# Patient Record
Sex: Female | Born: 1940 | Race: White | Hispanic: No | Marital: Married | Smoking: Former smoker
Health system: Southern US, Community
[De-identification: ages and names within clinical notes are randomized; demographics above are authoritative.]

## PROBLEM LIST (undated history)

## (undated) DIAGNOSIS — E7800 Pure hypercholesterolemia, unspecified: Secondary | ICD-10-CM

## (undated) DIAGNOSIS — R413 Other amnesia: Secondary | ICD-10-CM

## (undated) DIAGNOSIS — R51 Headache: Secondary | ICD-10-CM

## (undated) DIAGNOSIS — C50919 Malignant neoplasm of unspecified site of unspecified female breast: Secondary | ICD-10-CM

## (undated) DIAGNOSIS — T8859XA Other complications of anesthesia, initial encounter: Secondary | ICD-10-CM

## (undated) DIAGNOSIS — R911 Solitary pulmonary nodule: Secondary | ICD-10-CM

## (undated) DIAGNOSIS — R197 Diarrhea, unspecified: Secondary | ICD-10-CM

## (undated) DIAGNOSIS — K219 Gastro-esophageal reflux disease without esophagitis: Secondary | ICD-10-CM

## (undated) DIAGNOSIS — C3490 Malignant neoplasm of unspecified part of unspecified bronchus or lung: Secondary | ICD-10-CM

## (undated) DIAGNOSIS — I10 Essential (primary) hypertension: Secondary | ICD-10-CM

## (undated) DIAGNOSIS — T4145XA Adverse effect of unspecified anesthetic, initial encounter: Secondary | ICD-10-CM

## (undated) DIAGNOSIS — D649 Anemia, unspecified: Secondary | ICD-10-CM

## (undated) DIAGNOSIS — C50911 Malignant neoplasm of unspecified site of right female breast: Secondary | ICD-10-CM

## (undated) DIAGNOSIS — R519 Headache, unspecified: Secondary | ICD-10-CM

## (undated) HISTORY — PX: ABDOMINAL HYSTERECTOMY: SHX81

## (undated) HISTORY — PX: TONSILLECTOMY: SUR1361

## (undated) HISTORY — PX: BREAST SURGERY: SHX581

## (undated) HISTORY — DX: Other amnesia: R41.3

## (undated) HISTORY — DX: Gastro-esophageal reflux disease without esophagitis: K21.9

## (undated) HISTORY — DX: Malignant neoplasm of unspecified site of right female breast: C50.911

## (undated) HISTORY — DX: Solitary pulmonary nodule: R91.1

## (undated) HISTORY — DX: Malignant neoplasm of unspecified part of unspecified bronchus or lung: C34.90

## (undated) HISTORY — DX: Pure hypercholesterolemia, unspecified: E78.00

## (undated) HISTORY — DX: Malignant neoplasm of unspecified site of unspecified female breast: C50.919

## (undated) HISTORY — PX: CARPAL TUNNEL RELEASE: SHX101

## (undated) HISTORY — PX: CATARACT EXTRACTION: SUR2

## (undated) HISTORY — DX: Essential (primary) hypertension: I10

---

## 1999-12-12 ENCOUNTER — Encounter: Admission: RE | Admit: 1999-12-12 | Discharge: 2000-03-11 | Payer: Self-pay | Admitting: Radiation Oncology

## 2001-05-10 ENCOUNTER — Encounter (HOSPITAL_COMMUNITY): Admission: RE | Admit: 2001-05-10 | Discharge: 2001-06-09 | Payer: Self-pay | Admitting: Oncology

## 2001-05-10 ENCOUNTER — Encounter: Admission: RE | Admit: 2001-05-10 | Discharge: 2001-05-10 | Payer: Self-pay | Admitting: Oncology

## 2001-08-13 ENCOUNTER — Encounter: Admission: RE | Admit: 2001-08-13 | Discharge: 2001-08-13 | Payer: Self-pay | Admitting: Oncology

## 2001-08-21 ENCOUNTER — Other Ambulatory Visit: Admission: RE | Admit: 2001-08-21 | Discharge: 2001-08-21 | Payer: Self-pay | Admitting: Obstetrics and Gynecology

## 2001-08-21 ENCOUNTER — Ambulatory Visit (HOSPITAL_COMMUNITY): Admission: RE | Admit: 2001-08-21 | Discharge: 2001-08-21 | Payer: Self-pay | Admitting: Obstetrics and Gynecology

## 2001-08-21 ENCOUNTER — Encounter: Payer: Self-pay | Admitting: Obstetrics and Gynecology

## 2001-09-25 ENCOUNTER — Other Ambulatory Visit: Admission: RE | Admit: 2001-09-25 | Discharge: 2001-09-25 | Payer: Self-pay | Admitting: Obstetrics and Gynecology

## 2001-10-25 ENCOUNTER — Encounter (HOSPITAL_COMMUNITY): Admission: RE | Admit: 2001-10-25 | Discharge: 2001-11-24 | Payer: Self-pay | Admitting: Oncology

## 2001-10-25 ENCOUNTER — Encounter: Admission: RE | Admit: 2001-10-25 | Discharge: 2001-10-25 | Payer: Self-pay | Admitting: Oncology

## 2002-05-06 ENCOUNTER — Encounter: Admission: RE | Admit: 2002-05-06 | Discharge: 2002-05-06 | Payer: Self-pay | Admitting: Oncology

## 2002-05-06 ENCOUNTER — Encounter (HOSPITAL_COMMUNITY): Admission: RE | Admit: 2002-05-06 | Discharge: 2002-06-05 | Payer: Self-pay | Admitting: Oncology

## 2002-08-28 ENCOUNTER — Encounter: Admission: RE | Admit: 2002-08-28 | Discharge: 2002-08-28 | Payer: Self-pay | Admitting: Oncology

## 2002-08-28 ENCOUNTER — Encounter (HOSPITAL_COMMUNITY): Admission: RE | Admit: 2002-08-28 | Discharge: 2002-09-27 | Payer: Self-pay | Admitting: Oncology

## 2002-08-28 ENCOUNTER — Encounter (HOSPITAL_COMMUNITY): Payer: Self-pay | Admitting: Oncology

## 2002-11-07 ENCOUNTER — Encounter: Admission: RE | Admit: 2002-11-07 | Discharge: 2002-11-07 | Payer: Self-pay | Admitting: Oncology

## 2002-11-07 ENCOUNTER — Encounter (HOSPITAL_COMMUNITY): Admission: RE | Admit: 2002-11-07 | Discharge: 2002-12-07 | Payer: Self-pay | Admitting: Oncology

## 2003-03-02 ENCOUNTER — Emergency Department (HOSPITAL_COMMUNITY): Admission: EM | Admit: 2003-03-02 | Discharge: 2003-03-02 | Payer: Self-pay | Admitting: Emergency Medicine

## 2003-05-20 ENCOUNTER — Encounter: Admission: RE | Admit: 2003-05-20 | Discharge: 2003-05-20 | Payer: Self-pay | Admitting: Oncology

## 2003-05-20 ENCOUNTER — Encounter (HOSPITAL_COMMUNITY): Admission: RE | Admit: 2003-05-20 | Discharge: 2003-06-18 | Payer: Self-pay | Admitting: Oncology

## 2003-09-02 ENCOUNTER — Ambulatory Visit (HOSPITAL_COMMUNITY): Admission: RE | Admit: 2003-09-02 | Discharge: 2003-09-02 | Payer: Self-pay | Admitting: Obstetrics and Gynecology

## 2003-11-18 ENCOUNTER — Encounter (HOSPITAL_COMMUNITY): Admission: RE | Admit: 2003-11-18 | Discharge: 2003-12-18 | Payer: Self-pay | Admitting: Oncology

## 2003-11-18 ENCOUNTER — Encounter: Admission: RE | Admit: 2003-11-18 | Discharge: 2003-11-18 | Payer: Self-pay | Admitting: Oncology

## 2003-12-22 ENCOUNTER — Ambulatory Visit (HOSPITAL_COMMUNITY): Admission: RE | Admit: 2003-12-22 | Discharge: 2003-12-22 | Payer: Self-pay | Admitting: Family Medicine

## 2004-02-17 ENCOUNTER — Ambulatory Visit (HOSPITAL_COMMUNITY): Admission: RE | Admit: 2004-02-17 | Discharge: 2004-02-17 | Payer: Self-pay | Admitting: Internal Medicine

## 2004-03-16 ENCOUNTER — Ambulatory Visit (HOSPITAL_COMMUNITY): Admission: RE | Admit: 2004-03-16 | Discharge: 2004-03-16 | Payer: Self-pay | Admitting: Internal Medicine

## 2004-03-30 ENCOUNTER — Ambulatory Visit (HOSPITAL_COMMUNITY): Admission: RE | Admit: 2004-03-30 | Discharge: 2004-03-30 | Payer: Self-pay | Admitting: Oncology

## 2004-05-02 ENCOUNTER — Emergency Department (HOSPITAL_COMMUNITY): Admission: EM | Admit: 2004-05-02 | Discharge: 2004-05-02 | Payer: Self-pay | Admitting: Emergency Medicine

## 2004-05-31 ENCOUNTER — Encounter: Admission: RE | Admit: 2004-05-31 | Discharge: 2004-06-17 | Payer: Self-pay | Admitting: Oncology

## 2004-05-31 ENCOUNTER — Encounter (HOSPITAL_COMMUNITY): Admission: RE | Admit: 2004-05-31 | Discharge: 2004-06-17 | Payer: Self-pay | Admitting: Oncology

## 2004-10-05 ENCOUNTER — Encounter: Admission: RE | Admit: 2004-10-05 | Discharge: 2004-10-05 | Payer: Self-pay | Admitting: Oncology

## 2004-10-05 ENCOUNTER — Encounter (HOSPITAL_COMMUNITY): Admission: RE | Admit: 2004-10-05 | Discharge: 2004-11-04 | Payer: Self-pay | Admitting: Oncology

## 2004-12-14 ENCOUNTER — Encounter (HOSPITAL_COMMUNITY): Admission: RE | Admit: 2004-12-14 | Discharge: 2005-01-13 | Payer: Self-pay | Admitting: Oncology

## 2004-12-14 ENCOUNTER — Ambulatory Visit (HOSPITAL_COMMUNITY): Payer: Self-pay | Admitting: Oncology

## 2004-12-14 ENCOUNTER — Encounter: Admission: RE | Admit: 2004-12-14 | Discharge: 2004-12-14 | Payer: Self-pay | Admitting: Oncology

## 2005-01-25 ENCOUNTER — Encounter: Admission: RE | Admit: 2005-01-25 | Discharge: 2005-01-25 | Payer: Self-pay | Admitting: Oncology

## 2005-05-25 ENCOUNTER — Ambulatory Visit (HOSPITAL_COMMUNITY): Admission: RE | Admit: 2005-05-25 | Discharge: 2005-05-25 | Payer: Self-pay | Admitting: Family Medicine

## 2005-09-18 HISTORY — PX: BILATERAL OOPHORECTOMY: SHX1221

## 2005-10-11 ENCOUNTER — Ambulatory Visit (HOSPITAL_COMMUNITY): Admission: RE | Admit: 2005-10-11 | Discharge: 2005-10-11 | Payer: Self-pay | Admitting: Obstetrics and Gynecology

## 2005-10-24 ENCOUNTER — Encounter: Admission: RE | Admit: 2005-10-24 | Discharge: 2005-10-24 | Payer: Self-pay | Admitting: Oncology

## 2005-10-26 ENCOUNTER — Encounter: Admission: RE | Admit: 2005-10-26 | Discharge: 2005-10-26 | Payer: Self-pay | Admitting: Obstetrics and Gynecology

## 2005-11-02 ENCOUNTER — Encounter: Admission: RE | Admit: 2005-11-02 | Discharge: 2005-11-02 | Payer: Self-pay | Admitting: Oncology

## 2005-11-02 ENCOUNTER — Encounter (INDEPENDENT_AMBULATORY_CARE_PROVIDER_SITE_OTHER): Payer: Self-pay | Admitting: *Deleted

## 2005-11-29 ENCOUNTER — Encounter (HOSPITAL_COMMUNITY): Admission: RE | Admit: 2005-11-29 | Discharge: 2005-12-29 | Payer: Self-pay | Admitting: Oncology

## 2005-11-29 ENCOUNTER — Encounter: Admission: RE | Admit: 2005-11-29 | Discharge: 2005-11-29 | Payer: Self-pay | Admitting: Oncology

## 2005-11-29 ENCOUNTER — Ambulatory Visit (HOSPITAL_COMMUNITY): Payer: Self-pay | Admitting: Oncology

## 2006-04-19 ENCOUNTER — Ambulatory Visit (HOSPITAL_COMMUNITY): Admission: RE | Admit: 2006-04-19 | Discharge: 2006-04-19 | Payer: Self-pay | Admitting: Obstetrics and Gynecology

## 2006-04-19 ENCOUNTER — Encounter (INDEPENDENT_AMBULATORY_CARE_PROVIDER_SITE_OTHER): Payer: Self-pay | Admitting: Specialist

## 2006-08-20 ENCOUNTER — Ambulatory Visit (HOSPITAL_COMMUNITY): Admission: RE | Admit: 2006-08-20 | Discharge: 2006-08-20 | Payer: Self-pay | Admitting: Oncology

## 2006-11-14 ENCOUNTER — Encounter (HOSPITAL_COMMUNITY): Admission: RE | Admit: 2006-11-14 | Discharge: 2006-12-14 | Payer: Self-pay | Admitting: Oncology

## 2006-11-28 ENCOUNTER — Ambulatory Visit (HOSPITAL_COMMUNITY): Payer: Self-pay | Admitting: Oncology

## 2007-05-01 ENCOUNTER — Encounter (HOSPITAL_COMMUNITY): Admission: RE | Admit: 2007-05-01 | Discharge: 2007-05-31 | Payer: Self-pay | Admitting: Oncology

## 2007-11-06 ENCOUNTER — Encounter (HOSPITAL_COMMUNITY): Admission: RE | Admit: 2007-11-06 | Discharge: 2007-12-06 | Payer: Self-pay | Admitting: Oncology

## 2007-11-25 ENCOUNTER — Ambulatory Visit (HOSPITAL_COMMUNITY): Payer: Self-pay | Admitting: Oncology

## 2008-01-16 ENCOUNTER — Ambulatory Visit (HOSPITAL_COMMUNITY): Admission: RE | Admit: 2008-01-16 | Discharge: 2008-01-16 | Payer: Self-pay | Admitting: Cardiovascular Disease

## 2008-06-25 ENCOUNTER — Ambulatory Visit (HOSPITAL_COMMUNITY): Admission: RE | Admit: 2008-06-25 | Discharge: 2008-06-25 | Payer: Self-pay | Admitting: Family Medicine

## 2008-07-19 ENCOUNTER — Encounter: Admission: RE | Admit: 2008-07-19 | Discharge: 2008-07-19 | Payer: Self-pay | Admitting: Orthopedic Surgery

## 2008-07-28 ENCOUNTER — Encounter (HOSPITAL_COMMUNITY): Admission: RE | Admit: 2008-07-28 | Discharge: 2008-08-27 | Payer: Self-pay | Admitting: Orthopedic Surgery

## 2008-11-09 ENCOUNTER — Encounter (HOSPITAL_COMMUNITY): Admission: RE | Admit: 2008-11-09 | Discharge: 2008-12-09 | Payer: Self-pay | Admitting: Oncology

## 2008-11-12 ENCOUNTER — Emergency Department (HOSPITAL_COMMUNITY): Admission: EM | Admit: 2008-11-12 | Discharge: 2008-11-12 | Payer: Self-pay | Admitting: Emergency Medicine

## 2008-11-23 ENCOUNTER — Ambulatory Visit (HOSPITAL_COMMUNITY): Payer: Self-pay | Admitting: Oncology

## 2008-12-21 ENCOUNTER — Emergency Department (HOSPITAL_COMMUNITY): Admission: EM | Admit: 2008-12-21 | Discharge: 2008-12-21 | Payer: Self-pay | Admitting: Emergency Medicine

## 2008-12-24 ENCOUNTER — Ambulatory Visit (HOSPITAL_COMMUNITY): Admission: RE | Admit: 2008-12-24 | Discharge: 2008-12-24 | Payer: Self-pay | Admitting: Family Medicine

## 2008-12-25 ENCOUNTER — Ambulatory Visit (HOSPITAL_COMMUNITY): Admission: RE | Admit: 2008-12-25 | Discharge: 2008-12-25 | Payer: Self-pay | Admitting: Family Medicine

## 2009-03-31 ENCOUNTER — Ambulatory Visit (HOSPITAL_BASED_OUTPATIENT_CLINIC_OR_DEPARTMENT_OTHER): Admission: RE | Admit: 2009-03-31 | Discharge: 2009-03-31 | Payer: Self-pay | Admitting: *Deleted

## 2009-06-03 ENCOUNTER — Ambulatory Visit (HOSPITAL_COMMUNITY): Admission: RE | Admit: 2009-06-03 | Discharge: 2009-06-03 | Payer: Self-pay | Admitting: Family Medicine

## 2009-06-10 ENCOUNTER — Ambulatory Visit (HOSPITAL_COMMUNITY): Admission: RE | Admit: 2009-06-10 | Discharge: 2009-06-10 | Payer: Self-pay | Admitting: Family Medicine

## 2009-06-21 ENCOUNTER — Encounter: Admission: RE | Admit: 2009-06-21 | Discharge: 2009-06-21 | Payer: Self-pay | Admitting: Oncology

## 2009-09-18 HISTORY — PX: COLONOSCOPY: SHX174

## 2009-09-18 HISTORY — PX: INCISION AND DRAINAGE OF WOUND: SHX1803

## 2009-11-22 ENCOUNTER — Ambulatory Visit (HOSPITAL_COMMUNITY): Payer: Self-pay | Admitting: Oncology

## 2009-11-30 ENCOUNTER — Encounter: Payer: Self-pay | Admitting: Internal Medicine

## 2009-12-01 ENCOUNTER — Ambulatory Visit (HOSPITAL_COMMUNITY): Admission: RE | Admit: 2009-12-01 | Discharge: 2009-12-01 | Payer: Self-pay | Admitting: Internal Medicine

## 2009-12-01 ENCOUNTER — Ambulatory Visit: Payer: Self-pay | Admitting: Internal Medicine

## 2009-12-07 ENCOUNTER — Encounter: Payer: Self-pay | Admitting: Internal Medicine

## 2009-12-15 ENCOUNTER — Ambulatory Visit (HOSPITAL_COMMUNITY): Admission: RE | Admit: 2009-12-15 | Discharge: 2009-12-15 | Payer: Self-pay | Admitting: Oncology

## 2010-01-06 ENCOUNTER — Ambulatory Visit (HOSPITAL_COMMUNITY): Payer: Self-pay | Admitting: Oncology

## 2010-08-29 ENCOUNTER — Inpatient Hospital Stay (HOSPITAL_COMMUNITY)
Admission: AD | Admit: 2010-08-29 | Discharge: 2010-09-05 | Payer: Self-pay | Source: Home / Self Care | Attending: General Surgery | Admitting: General Surgery

## 2010-08-30 ENCOUNTER — Encounter (INDEPENDENT_AMBULATORY_CARE_PROVIDER_SITE_OTHER): Payer: Self-pay | Admitting: General Surgery

## 2010-10-13 ENCOUNTER — Other Ambulatory Visit
Admission: RE | Admit: 2010-10-13 | Discharge: 2010-10-13 | Payer: Self-pay | Source: Home / Self Care | Admitting: General Surgery

## 2010-10-18 NOTE — Letter (Signed)
Summary: Internal Other Kari Bullock  Internal Other Kari Bullock   Imported By: Cloria Spring LPN 16/06/9603 54:09:81  _____________________________________________________________________  External Attachment:    Type:   Image     Comment:   External Document

## 2010-10-18 NOTE — Letter (Signed)
Summary: Patient Notice, Colon Biopsy Results  [facility]  [address]   [place], [place] [address]   Phone: [phone]  Fax: [phone]       December 07, 2009   Kari Bullock [address] [place], [place]  [address] July 19, 1941    Dear Ms. Ladona Ridgel,  I am pleased to inform you that the biopsies taken during your recent colonoscopy did not show any evidence of cancer upon pathologic examination.  Additional information/recommendations:  You should have a repeat colonoscopy examination  in 5 years.  Please call us if you are having persistent problems or have questions about your condition that have not been fully answered at this time.  Sincerely,    R. Roetta Sessions MD  [facility] Ph: [phone]    Fax: [phone]

## 2010-11-21 ENCOUNTER — Ambulatory Visit (HOSPITAL_COMMUNITY): Payer: Medicare Other | Admitting: Oncology

## 2010-11-21 DIAGNOSIS — C50919 Malignant neoplasm of unspecified site of unspecified female breast: Secondary | ICD-10-CM

## 2010-11-28 LAB — MRSA PCR SCREENING: MRSA by PCR: POSITIVE — AB

## 2010-11-28 LAB — CBC
HCT: 30 % — ABNORMAL LOW (ref 36.0–46.0)
HCT: 36.3 % (ref 36.0–46.0)
Hemoglobin: 10.1 g/dL — ABNORMAL LOW (ref 12.0–15.0)
MCH: 28.1 pg (ref 26.0–34.0)
MCHC: 33.7 g/dL (ref 30.0–36.0)
MCV: 84.6 fL (ref 78.0–100.0)
MCV: 83.6 fL (ref 78.0–100.0)
MCV: 83.6 fL (ref 78.0–100.0)
Platelets: 248 10*3/uL (ref 150–400)
RBC: 3.95 MIL/uL (ref 3.87–5.11)
RBC: 4.34 MIL/uL (ref 3.87–5.11)
RDW: 14.5 % (ref 11.5–15.5)
WBC: 8.3 10*3/uL (ref 4.0–10.5)
WBC: 20.8 10*3/uL — ABNORMAL HIGH (ref 4.0–10.5)

## 2010-11-28 LAB — CULTURE, ROUTINE-ABSCESS

## 2010-11-28 LAB — BASIC METABOLIC PANEL
BUN: 9 mg/dL (ref 6–23)
CO2: 27 mEq/L (ref 19–32)
Chloride: 106 mEq/L (ref 96–112)
Chloride: 106 mEq/L (ref 96–112)
Chloride: 96 mEq/L (ref 96–112)
Creatinine, Ser: 0.53 mg/dL (ref 0.4–1.2)
Creatinine, Ser: 0.65 mg/dL (ref 0.4–1.2)
GFR calc Af Amer: >60 mL/min (ref >60)
GFR calc Af Amer: >60 mL/min (ref >60)
GFR calc Af Amer: >60 mL/min (ref >60)
GFR calc non Af Amer: >60 mL/min (ref >60)
Glucose, Bld: 117 mg/dL — ABNORMAL HIGH (ref 70–99)
Potassium: 3.8 mEq/L (ref 3.5–5.1)
Potassium: 2.9 mEq/L — ABNORMAL LOW (ref 3.5–5.1)
Potassium: 3 mEq/L — ABNORMAL LOW (ref 3.5–5.1)
Sodium: 131 mEq/L — ABNORMAL LOW (ref 135–145)

## 2010-11-28 LAB — DIFFERENTIAL
Basophils Absolute: 0.1 10*3/uL (ref 0.0–0.1)
Basophils Relative: 0 % (ref 0–1)
Eosinophils Absolute: 0.7 10*3/uL (ref 0.0–0.7)
Eosinophils Relative: 3 % (ref 0–5)
Eosinophils Relative: 1 % (ref 0–5)
Eosinophils Relative: 4 % (ref 0–5)
Lymphocytes Relative: 20 % (ref 12–46)
Lymphocytes Relative: 6 % — ABNORMAL LOW (ref 12–46)
Lymphs Abs: 1.7 10*3/uL (ref 0.7–4.0)
Lymphs Abs: 1.1 10*3/uL (ref 0.7–4.0)
Monocytes Absolute: 1.5 10*3/uL — ABNORMAL HIGH (ref 0.1–1.0)
Monocytes Absolute: 2 10*3/uL — ABNORMAL HIGH (ref 0.1–1.0)
Monocytes Relative: 9 % (ref 3–12)
Neutro Abs: 5.7 10*3/uL (ref 1.7–7.7)
Neutro Abs: 13.3 10*3/uL — ABNORMAL HIGH (ref 1.7–7.7)
Neutro Abs: 17.4 10*3/uL — ABNORMAL HIGH (ref 1.7–7.7)
Neutrophils Relative %: 69 % (ref 43–77)

## 2010-11-28 LAB — POCT I-STAT 4, (NA,K, GLUC, HGB,HCT)
Glucose, Bld: 132 mg/dL — ABNORMAL HIGH (ref 70–99)
HCT: 32 % — ABNORMAL LOW (ref 36.0–46.0)
Sodium: 134 mEq/L — ABNORMAL LOW (ref 135–145)

## 2010-11-28 LAB — ANAEROBIC CULTURE

## 2010-11-28 LAB — CULTURE, BLOOD (SINGLE): Culture: NO GROWTH

## 2010-11-28 LAB — CLOSTRIDIUM DIFFICILE BY PCR

## 2010-12-21 ENCOUNTER — Other Ambulatory Visit (HOSPITAL_COMMUNITY): Payer: Self-pay | Admitting: Oncology

## 2010-12-21 DIAGNOSIS — Z139 Encounter for screening, unspecified: Secondary | ICD-10-CM

## 2010-12-25 LAB — POCT I-STAT, CHEM 8
Calcium, Ion: 1.34 mmol/L — ABNORMAL HIGH (ref 1.12–1.32)
Glucose, Bld: 112 mg/dL — ABNORMAL HIGH (ref 70–99)
HCT: 43 % (ref 36.0–46.0)
Hemoglobin: 14.6 g/dL (ref 12.0–15.0)
TCO2: 28 mmol/L (ref 0–100)

## 2010-12-28 LAB — URINE CULTURE: Colony Count: 3000

## 2010-12-28 LAB — CBC
HCT: 37.4 % (ref 36.0–46.0)
MCHC: 33.7 g/dL (ref 30.0–36.0)
MCV: 86.1 fL (ref 78.0–100.0)
RBC: 4.34 MIL/uL (ref 3.87–5.11)

## 2010-12-28 LAB — DIFFERENTIAL
Basophils Relative: 0 % (ref 0–1)
Eosinophils Absolute: 0.1 10*3/uL (ref 0.0–0.7)
Eosinophils Relative: 1 % (ref 0–5)
Lymphs Abs: 1 10*3/uL (ref 0.7–4.0)
Monocytes Absolute: 0.3 10*3/uL (ref 0.1–1.0)
Monocytes Relative: 3 % (ref 3–12)
Neutrophils Relative %: 85 % — ABNORMAL HIGH (ref 43–77)

## 2010-12-28 LAB — BASIC METABOLIC PANEL
BUN: 18 mg/dL (ref 6–23)
Calcium: 10.4 mg/dL (ref 8.4–10.5)
Chloride: 100 mEq/L (ref 96–112)
Creatinine, Ser: 0.68 mg/dL (ref 0.4–1.2)
GFR calc Af Amer: >60 mL/min (ref >60)

## 2010-12-28 LAB — URINALYSIS, ROUTINE W REFLEX MICROSCOPIC
Bilirubin Urine: NEGATIVE
Glucose, UA: NEGATIVE mg/dL
Hgb urine dipstick: NEGATIVE
Ketones, ur: NEGATIVE mg/dL
Urobilinogen, UA: 0.2 mg/dL (ref 0.0–1.0)

## 2010-12-29 ENCOUNTER — Ambulatory Visit (HOSPITAL_COMMUNITY)
Admission: RE | Admit: 2010-12-29 | Discharge: 2010-12-29 | Disposition: A | Discharge status: Home / Self Care | Payer: Medicare Other | Source: Ambulatory Visit | Attending: Oncology | Admitting: Oncology

## 2010-12-29 DIAGNOSIS — Z139 Encounter for screening, unspecified: Secondary | ICD-10-CM

## 2010-12-29 DIAGNOSIS — Z1231 Encounter for screening mammogram for malignant neoplasm of breast: Secondary | ICD-10-CM | POA: Insufficient documentation

## 2011-01-31 NOTE — Op Note (Signed)
NAME:  Kari Bullock, Kari Bullock NO.:  [id number]   MEDICAL RECORD NO.:  [id number]          PATIENT TYPE:  AMB   LOCATION:  DSC                          FACILITY:  [facility]   PHYSICIAN:  Tennis Must Meyerdierks, M.D.DATE OF BIRTH:  July 27, 1941   DATE OF PROCEDURE:  03/31/2009  DATE OF DISCHARGE:                               OPERATIVE REPORT   PREOPERATIVE DIAGNOSIS:  Right carpal tunnel syndrome.   POSTOPERATIVE DIAGNOSIS:  Right carpal tunnel syndrome.   PROCEDURE:  Decompression median nerve right carpal tunnel.   SURGEON:  Lowell Bouton, MD   ANESTHESIA:  Marcaine 0.50% local with sedation.   OPERATIVE FINDINGS:  The patient had no masses in the carpal canal.  The  motor branch of the nerve was intact.   PROCEDURE:  Under 0.50% Marcaine local anesthesia with a tourniquet on  the right arm, the right hand was prepped and draped in the usual  fashion.  After exsanguinating the limb the tourniquet was inflated to  250 mmHg.  A 3-cm longitudinal incision was made in the palm just ulnar  to the thenar crease and carried down through the subcutaneous tissues.  Blunt dissection was carried through the superficial palmar fascia  distal to the transverse carpal ligament.  A hemostat was then placed in  the carpal canal up against the hook of the hamate and the transverse  carpal ligament was divided on the ulnar border of the median nerve.  The proximal end of the ligament was divided with the scissors after  dissecting the nerve away from the undersurface of the ligament.  The  carpal canal was then palpated and was found to be adequately  decompressed.  The nerve was examined and the motor branch was  identified.  The wound was then irrigated copiously with saline.  The  skin was closed with 4-0 nylon sutures.  Sterile dressings were applied  followed by volar wrist splint.  The patient tolerated the procedure  well and went to the recovery room awake in stable  in good condition.      Lowell Bouton, M.D.  Electronically Signed     EMM/MEDQ  D:  03/31/2009  T:  04/01/2009  Job:  [id number]   cc:   Corrie Mckusick, M.D.

## 2011-02-03 NOTE — Op Note (Signed)
NAME:  Kari Bullock, Kari Bullock                        ACCOUNT NO.:  [id number]   MEDICAL RECORD NO.:  [id number]                   PATIENT TYPE:  AMB   LOCATION:  DAY                                  FACILITY:  [facility]   PHYSICIAN:  Lionel December, M.D.                 DATE OF BIRTH:  1941-08-31   DATE OF PROCEDURE:  DATE OF DISCHARGE:                                 OPERATIVE REPORT   PROCEDURE:  Esophagogastroduodenoscopy followed by total colonoscopy.   ENDOSCOPIST:  Lionel December, M.D.   INDICATIONS:  Kari Bullock is a 70 year old Caucasian female who continues to  experience midepigastric pain.  She has been treated with a PPI.  She also  is found to have a positive H. pylori and took Prev pack for 2 weeks but  pain has come back.  She, however, admits that she is under a lot of stress.  Her brother was recently diagnosed with pancreatic carcinoma and her husband  __________ the hospital getting ready to have a CABG.  She also is  undergoing surveillance colonoscopy.  She has a history of colonic polyps  and last exam was 5 years ago.  Procedure and risks were reviewed with the  patient and informed consent was obtained.   PREOPERATIVE MEDICATIONS:  Cetacaine spray for oropharyngeal topical  anesthesia, Demerol 50 mg IV and Versed 10 mg IV in divided dose.   FINDINGS:  Procedure performed in endoscopy suite.  The patient's vital  signs and O2 saturation were monitored during the procedure and remained  stable.   PROCEDURE #1: ESOPHAGOGASTRODUODENOSCOPY:  The patient was placed in the  left lateral recumbent position and Olympus videoscope was passed via the  oropharynx without any difficulty into the esophagus.   ESOPHAGUS:  Mucosa of the esophagus was normal, however, GE junction was  very serrated.  Pictures taken for the record.  There was a small sliding  hiatal hernia; no more than 3 cm in length. There was a single polyp at the  level of the diaphragmatic hiatus with erythematous  mucosa.  Multiple  biopsies were taken for histology from this polyp.   STOMACH:  It was empty and distended very well with insufflation.  The folds  of the proximal stomach were normal.  Examination of the mucosa at body,  antrum, pyloric channel, as well as angularis, fundus, and cardia were  normal.   DUODENUM:  Examination of the bulb and postbulbar duodenum was normal.   Endoscope was withdrawn and the patient was prepared for procedure #2.   COLONOSCOPY:  Rectal examination was performed.  No abnormality noted on  external or digital exam.   Olympus videoscope was placed in the rectum and advanced under vision into  the sigmoid colon and beyond.  Preparation was satisfactory.  She had a few  diverticula at sigmoid colon.  The scope was advanced to cecum which was  identified by appendiceal orifice and ileocecal  valve; pictures taken for  the record.  The colonic mucosa was, once again, carefully examined.  There  were no polyps.  Rectal mucosa similarly was normal.   The scope was retroflexed to examine anorectal junction and small  hemorrhoids were noted below the dentate line. The endoscope was  straightened and withdrawn.  The patient tolerated the procedure well.   FINAL DIAGNOSES:  1. Small sliding hiatal hernia with very serrated gastroesophageal junction,     but no changes of Barrett's esophagus.  2. Small gastric polyp at the level of the diaphragmatic hiatus which was     almost completely ablated by a cold biopsy.  3. Normal examination of the stomach and first and second part of the     duodenum.  4. A few small diverticula at the sigmoid colon and external hemorrhoids.     Otherwise normal colonoscopy.   RECOMMENDATIONS:  1. She will continued Prevacid as before.  2. Levsin SL t.i.d. p.r.n.  3. I will be contacting the patient with biopsy results and further     recommendations.  If she keeps having upper abdominal pain would consider     abdominal CT with  attention to the pancreas.  Please note that she has     had ultrasound which was negative for cholelithiasis.      ___________________________________________                                            Lionel December, M.D.   NR/MEDQ  D:  02/17/2004  T:  02/18/2004  Job:  [id number]   cc:   Patrica Duel, M.D.  [address]  [place]  [place] [address]  Fax: [phone]

## 2011-02-03 NOTE — Op Note (Signed)
NAME:  Kari Bullock, LANGWORTHY NO.:  [id number]   MEDICAL RECORD NO.:  [id number]          PATIENT TYPE:  AMB   LOCATION:  DAY                           FACILITY:  [facility]   PHYSICIAN:  Tilda Burrow, M.D. DATE OF BIRTH:  Nov 29, 1940   DATE OF PROCEDURE:  04/19/2006  DATE OF DISCHARGE:                                 OPERATIVE REPORT   PREOPERATIVE DIAGNOSES:  1.  Postmenopausal ovarian cyst, rule out ovarian malignancy.  2.  Extensive seborrheic keratoses and skin nevi of head, neck, and chest.   POSTOPERATIVE DIAGNOSES:  1.  Postmenopausal ovarian cyst, rule out ovarian malignancy.  2.  Extensive seborrheic keratoses and skin nevi of head, neck, and chest.   PROCEDURE:  1.  Laparoscopic bilateral salpingo-oophorectomy.  2.  Excision of multiple skin tags, actinic keratoses, and skin nevi      (approximately 100).   DESCRIPTION OF PROCEDURE:  Patient was taken to the operating room, prepped  and draped for combined abdominal and vaginal procedure.  Foley catheter was  in place.  A sponge stick was placed in the vagina to hold the vaginal apex  upward and maintain pelvic mobility.  Attention was directed to the  umbilicus, and an infraumbilical vertical 1 cm skin incision performed as  well as a transverse suprapubic 1 cm incision and two lateral lower quadrant  incisions of similar length.  The varus needle was used and introduced  through the umbilicus while orienting the needle towards the pelvis with  appropriate double click incision obtained during insertion.  Water droplet  test used to confirm peritoneal location, and pneumoperitoneum achieved  under 12 mmHg.  The laparoscopic trocar was introduced under direct  visualization after the pneumoperitoneum had achieved to 3 liters CO2, and  the abdomen visualized.  No anterior abdominal trauma or bleeding was  suspected.  The suprapubic trocar was introduced under direct visualization  beginning with the 5 mm  trocar.  The lower abdomen was inspected, and a  special note was made of adhesions in the right lower quadrant where the  ascending colon was adherent to the anterior abdominal wall over an extended  area.  In the upper abdomen, there were omental adhesions to the anterior  abdominal wall of over an area in the left upper quadrant.  The two lower  quadrant trocars were then placed under direct visualization, 5 mm trocars,  while the suprapubic trocar was converted to an 11-12 mm port.  Attention  was directed to the pelvis.  The patient was placed in Trendelenburg  position.  The bowel freed from adhesions at the pelvis rim on the left, and  attention directed to the left adnexa.  The left tube and ovary were easily  identified, were initially thinly adherent to the side wall but easily  mobilized.  The left fallopian tube could be identified as to where it  entered into the pelvic floor closure.  The right tube and ovary were easily  identified, mobile, and without adhesions.  Attention was then directed to  the lower quadrant trocar sites, where we were able to  use the blunt tip  graspers to grasp the tube and ovary, elevate it, and insure adequate  separation from the retroperitoneal structures.  The harmonic scalpel was  placed in position, having been tested earlier, and was used first on the  right side.  The right side was serially clamped and coagulated with the  harmonic scalpel and mobilized off the side wall.  There was excellent  hemostasis on the right side.  The specimen was placed on the cul de sac.  On the left side, the tube was first taken off of the pelvic floor.  Then  the tube and ovary grasped, placed on counter-traction, and harmonic scalpel  used to coagulate and transect, as we marched across the infundibulopelvic  ligament and entered into the pelvis.  Both specimens were easily mobilized,  placed in the pelvis.  Irrigation had been performed as necessary for a   small amount of oozing associated with the manipulation of structures.  The  pedicles were completely hemostatic.  After specimens could be placed in the  suprapubic 11-12 mm EndoCatch bag, they were extracted through the  suprapubic site, being careful to maintain the intact bag.  The specimen was  not disrupted.  Inspection of the abdomen was once again performed, and no  additional hemostasis noted.  The omental adhesions to the anterior  abdominal wall were taken down approximately 90% in the left upper quadrant,  and the pelvis was completely mobile at the end of the procedure.  Patient  had approximately 100 cc of saline instilled into the abdomen, deflation of  the abdomen performed, and laparoscopic trocar sites were removed.  The  umbilical and suprapubic sites were closed at the fascial level with figure-  of-eight sutures of 0 Vicryl, and then the staple closure of the skin at all  four trocar sites performed.  Sponge and needle counts were correct.   SKIN NEVI REMOVAL:  As per patient's request, once the lower abdominal  procedure was completed, we paid attention to the extensive skin tags along  the base of the patient's neck, anterior chest, and under both breasts.  She  had multiple actinic keratoses over these areas.  In the area where the neck  chain would contact was particularly involved.  These were all between 2 and  7 mm in diameter and were of varying lengths protruding through the skin.  Approximately 100 of these little areas were elevated, sharply transected  using shave scissors, biopsy technique, and the area then cleansed and a  large OpSite was then placed over the chest area and two places under the  left breast and on the left side of the neck.  Several Band-Aids were placed  on the right side of the neck at isolated places lateral to the OpSites.  The patient tolerated the procedure well and went to the recovery room in good condition.  Sponge and needle  counts were correct.  Patient will be  allowed to go home with outpatient followup next week for staple removal.      Tilda Burrow, M.D.  Electronically Signed     JVF/MEDQ  D:  04/19/2006  T:  04/19/2006  Job:  [id number]

## 2011-02-27 ENCOUNTER — Ambulatory Visit (HOSPITAL_COMMUNITY)
Admission: RE | Admit: 2011-02-27 | Discharge: 2011-02-27 | Disposition: A | Discharge status: Home / Self Care | Payer: Medicare Other | Source: Ambulatory Visit | Attending: Family Medicine | Admitting: Family Medicine

## 2011-02-27 ENCOUNTER — Other Ambulatory Visit (HOSPITAL_COMMUNITY): Payer: Self-pay | Admitting: Family Medicine

## 2011-02-27 DIAGNOSIS — M25559 Pain in unspecified hip: Secondary | ICD-10-CM | POA: Insufficient documentation

## 2011-02-27 DIAGNOSIS — M1610 Unilateral primary osteoarthritis, unspecified hip: Secondary | ICD-10-CM | POA: Insufficient documentation

## 2011-02-27 DIAGNOSIS — M25551 Pain in right hip: Secondary | ICD-10-CM

## 2011-02-27 DIAGNOSIS — M169 Osteoarthritis of hip, unspecified: Secondary | ICD-10-CM | POA: Insufficient documentation

## 2011-06-12 LAB — DIFFERENTIAL
Eosinophils Relative: 8 — ABNORMAL HIGH
Lymphocytes Relative: 28
Monocytes Absolute: 0.4
Monocytes Relative: 8
Neutro Abs: 2.7

## 2011-06-12 LAB — COMPREHENSIVE METABOLIC PANEL
AST: 21
Albumin: 3.6
Alkaline Phosphatase: 59
BUN: 15
Chloride: 102
Creatinine, Ser: 0.63
GFR calc Af Amer: >60
Potassium: 3.9
Total Bilirubin: 0.3
Total Protein: 6.1

## 2011-06-12 LAB — CBC
HCT: 36.8
Platelets: 182
RDW: 14.4
WBC: 4.9

## 2011-11-20 ENCOUNTER — Encounter (HOSPITAL_COMMUNITY): Payer: Medicare Other | Attending: Oncology | Admitting: Oncology

## 2011-11-20 ENCOUNTER — Encounter (HOSPITAL_COMMUNITY): Payer: Self-pay | Admitting: Oncology

## 2011-11-20 VITALS — BP 146/82 | HR 76 | Temp 97.8°F | Ht 63.0 in | Wt 186.0 lb

## 2011-11-20 DIAGNOSIS — E669 Obesity, unspecified: Secondary | ICD-10-CM

## 2011-11-20 DIAGNOSIS — C50919 Malignant neoplasm of unspecified site of unspecified female breast: Secondary | ICD-10-CM

## 2011-11-20 DIAGNOSIS — I10 Essential (primary) hypertension: Secondary | ICD-10-CM | POA: Insufficient documentation

## 2011-11-20 DIAGNOSIS — Z853 Personal history of malignant neoplasm of breast: Secondary | ICD-10-CM

## 2011-11-20 DIAGNOSIS — N641 Fat necrosis of breast: Secondary | ICD-10-CM | POA: Insufficient documentation

## 2011-11-20 DIAGNOSIS — C773 Secondary and unspecified malignant neoplasm of axilla and upper limb lymph nodes: Secondary | ICD-10-CM | POA: Insufficient documentation

## 2011-11-20 DIAGNOSIS — A4902 Methicillin resistant Staphylococcus aureus infection, unspecified site: Secondary | ICD-10-CM

## 2011-11-20 LAB — COMPREHENSIVE METABOLIC PANEL
AST: 24 U/L (ref 0–37)
Albumin: 4 g/dL (ref 3.5–5.2)
BUN: 17 mg/dL (ref 6–23)
Calcium: 11.1 mg/dL — ABNORMAL HIGH (ref 8.4–10.5)
Creatinine, Ser: 0.77 mg/dL (ref 0.50–1.10)
Total Bilirubin: 0.4 mg/dL (ref 0.3–1.2)
Total Protein: 7.3 g/dL (ref 6.0–8.3)

## 2011-11-20 LAB — DIFFERENTIAL
Basophils Absolute: 0.1 10*3/uL (ref 0.0–0.1)
Basophils Relative: 1 % (ref 0–1)
Eosinophils Absolute: 0.3 10*3/uL (ref 0.0–0.7)
Eosinophils Relative: 5 % (ref 0–5)
Monocytes Absolute: 0.5 10*3/uL (ref 0.1–1.0)
Monocytes Relative: 7 % (ref 3–12)

## 2011-11-20 LAB — CBC
HCT: 38.4 % (ref 36.0–46.0)
Hemoglobin: 12.3 g/dL (ref 12.0–15.0)
MCH: 27.5 pg (ref 26.0–34.0)
MCHC: 32 g/dL (ref 30.0–36.0)
RDW: 14.1 % (ref 11.5–15.5)

## 2011-11-20 NOTE — Progress Notes (Signed)
CC:   Corrie Mckusick, M.D. Barbaraann Barthel, M.D. Maryln Gottron, M.D.  DIAGNOSES: 1. 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  HER2 was 2+ but indeterminate.  She had surgery     followed by the use of radiation therapy and then hormonal therapy.     She could not take tamoxifen.  She was diagnosed in February 2001.     We then switched to Arimidex which she took for a total of 5 years     ending on 02/15/2005.  She has been NED (no evidence of disease)     since. 2. 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 3. 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 4. Obesity and she is still weighing a little bit excessively at 186     pounds on a 5 foot 3 inch frame with a BMI of 33. 5. Benign skin lesions consisting of skin tags and seborrheic     keratoses in multiple places especially base of neck and right arm     pit, left breast. 6. MRSA (methicillin resistant Staphylococcus aureus) induced abscess     of the right buttocks treated by Dr. Malvin Johns with resolution. 7. Clostridium difficile diarrhea while hospitalized for the above     MRSA. 8. Reflux symptomatology. 9. Irregular heartbeat in the past. 10.Hypertension.  Colbie is here today doing very well.  She states that since she got over the abscess she has had a great year.  No complaints on review of systems other than a few questions about the skin lesions which I went over in detail with her; both at the base of her neck where she has skin tags, seborrheic keratoses, and under the right arm pit and left breast. We also went over the morphea that she has, a thickened area of skin about the size of a 4x4 cm slightly indented and retracted, very thickened.  She states that she  is doing well with her bowel function, etc.  She and her husband are living at home.  She looks very good.  No acute distress.  Vital signs are unchanged. Lymph nodes are negative throughout.  Both breasts are negative for any masses, just the radiation therapy and surgery changes on the right are noted.  She has no peripheral edema in the arms or legs.  Abdomen is obese without obvious organomegaly.  The heart did not reveal an irregular rhythm or rate today.  No murmur or gallop that I hear.  Her abdomen was soft and nontender.  Bowel sounds were normal.  Lung fields were clear.  So she is doing well.  We will see her in a year, sooner if need be.  We will get a baseline CBC, diff and CMET which I think she needs annually.   ______________________________ Ladona Horns. Mariel Sleet, MD ESN/MEDQ  D:  11/20/2011  T:  11/20/2011  Job:  [id number]

## 2011-11-20 NOTE — Progress Notes (Signed)
Addended by: Hester Mates A on: 11/20/2011 02:26 PM   Modules accepted: Orders

## 2011-11-20 NOTE — Progress Notes (Signed)
This office note has been dictated.

## 2011-11-21 ENCOUNTER — Telehealth (HOSPITAL_COMMUNITY): Payer: Self-pay | Admitting: *Deleted

## 2011-11-21 NOTE — Telephone Encounter (Signed)
Pt notified to drop one calcium pill a day and take vitamin d 1000-2000 units a day. She will return in 8 weeks for Ca+ level.

## 2011-12-13 ENCOUNTER — Other Ambulatory Visit (HOSPITAL_COMMUNITY): Payer: Self-pay | Admitting: Oncology

## 2011-12-13 DIAGNOSIS — Z139 Encounter for screening, unspecified: Secondary | ICD-10-CM

## 2012-01-01 ENCOUNTER — Ambulatory Visit (HOSPITAL_COMMUNITY)
Admission: RE | Admit: 2012-01-01 | Discharge: 2012-01-01 | Disposition: A | Discharge status: Home / Self Care | Payer: Medicare Other | Source: Ambulatory Visit | Attending: Oncology | Admitting: Oncology

## 2012-01-01 DIAGNOSIS — Z1231 Encounter for screening mammogram for malignant neoplasm of breast: Secondary | ICD-10-CM | POA: Insufficient documentation

## 2012-01-01 DIAGNOSIS — Z139 Encounter for screening, unspecified: Secondary | ICD-10-CM

## 2012-01-22 ENCOUNTER — Other Ambulatory Visit (HOSPITAL_COMMUNITY): Payer: Medicare Other

## 2012-01-31 ENCOUNTER — Other Ambulatory Visit (HOSPITAL_COMMUNITY): Payer: Self-pay | Admitting: Family Medicine

## 2012-02-07 ENCOUNTER — Ambulatory Visit (HOSPITAL_COMMUNITY)
Admission: RE | Admit: 2012-02-07 | Discharge: 2012-02-07 | Disposition: A | Discharge status: Home / Self Care | Payer: Medicare Other | Source: Ambulatory Visit | Attending: Family Medicine | Admitting: Family Medicine

## 2012-02-07 DIAGNOSIS — Z780 Asymptomatic menopausal state: Secondary | ICD-10-CM | POA: Insufficient documentation

## 2012-02-07 DIAGNOSIS — M899 Disorder of bone, unspecified: Secondary | ICD-10-CM | POA: Insufficient documentation

## 2012-02-08 ENCOUNTER — Other Ambulatory Visit (HOSPITAL_COMMUNITY): Payer: Medicare Other

## 2012-02-29 ENCOUNTER — Telehealth (HOSPITAL_COMMUNITY): Payer: Self-pay | Admitting: *Deleted

## 2012-02-29 NOTE — Telephone Encounter (Signed)
Pt is on evista 60 mg and one calcium 600 and 2000 units vitamin D. We decreased her to one calcium daily in March due to elevated calcium. She had calcium level done at [facility] office in May and they are faxing [place] copy.

## 2012-11-19 ENCOUNTER — Encounter (HOSPITAL_COMMUNITY): Payer: Self-pay | Admitting: Oncology

## 2012-11-19 ENCOUNTER — Encounter (HOSPITAL_COMMUNITY): Payer: Medicare Other | Attending: Oncology | Admitting: Oncology

## 2012-11-19 VITALS — BP 122/70 | HR 84 | Temp 96.8°F | Resp 16 | Wt 186.7 lb

## 2012-11-19 DIAGNOSIS — M25559 Pain in unspecified hip: Secondary | ICD-10-CM

## 2012-11-19 DIAGNOSIS — Z09 Encounter for follow-up examination after completed treatment for conditions other than malignant neoplasm: Secondary | ICD-10-CM | POA: Insufficient documentation

## 2012-11-19 DIAGNOSIS — I10 Essential (primary) hypertension: Secondary | ICD-10-CM

## 2012-11-19 DIAGNOSIS — Z853 Personal history of malignant neoplasm of breast: Secondary | ICD-10-CM

## 2012-11-19 LAB — CBC WITH DIFFERENTIAL/PLATELET
Eosinophils Relative: 5 % (ref 0–5)
Hemoglobin: 10.7 g/dL — ABNORMAL LOW (ref 12.0–15.0)
Lymphocytes Relative: 33 % (ref 12–46)
Lymphs Abs: 1.7 10*3/uL (ref 0.7–4.0)
MCV: 88.4 fL (ref 78.0–100.0)
Monocytes Relative: 8 % (ref 3–12)
Platelets: 184 10*3/uL (ref 150–400)
RBC: 3.61 MIL/uL — ABNORMAL LOW (ref 3.87–5.11)
WBC: 5.3 10*3/uL (ref 4.0–10.5)

## 2012-11-19 LAB — COMPREHENSIVE METABOLIC PANEL
ALT: 26 U/L (ref 0–35)
Alkaline Phosphatase: 66 U/L (ref 39–117)
BUN: 15 mg/dL (ref 6–23)
CO2: 27 mEq/L (ref 19–32)
Calcium: 10.3 mg/dL (ref 8.4–10.5)
GFR calc Af Amer: >90 mL/min (ref >90)
GFR calc non Af Amer: 88 mL/min — ABNORMAL LOW (ref >90)
Glucose, Bld: 109 mg/dL — ABNORMAL HIGH (ref 70–99)
Potassium: 3.6 mEq/L (ref 3.5–5.1)
Sodium: 137 mEq/L (ref 135–145)

## 2012-11-19 NOTE — Progress Notes (Signed)
Kari Bullock presented for labwork. Labs per MD order drawn via Peripheral Line 23 gauge needle inserted in Left hand  Good blood return present. Procedure without incident.  Needle removed intact. Patient tolerated procedure well.

## 2012-11-19 NOTE — Progress Notes (Signed)
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 HER-2/neu was indeterminant at 2+. She was diagnosed in February 2001 treated with surgery followed by radiation therapy and then hormonal therapy. She could not tolerate tamoxifen, was switched to Arimidex which she took for total 5 years ending on 02/15/2005. She still remains free of disease. #2 morphea occurring in the radiation therapy field in the right breast biopsy proven in April 2011 #3 fat necrosis of the right breast with a biopsy 2007 #4 MRSA-induced abscess of the right buttocks treated in December 2011 #5 C. difficile diarrhea during treatment for the above MRSA #6 left hip degenerative joint disease unable to go up stairs easily #7 GERD #8 obesity #9 irregular heartbeat #10 hypertension #11 hypercalcemia in 2013  She has had a good year. Her biggest complaint is that of her left hip discomfort. She has difficulty going up steps or stepping up on occur. Otherwise she states she does very well. She does not lose her balance. She does not need a cane she states. Her oncology review of systems a lysis negative. She is due for her mammography very shortly.  Vital signs are stable.  Lungs are clear to auscultation and percussion. She does have a few benign seborrheic keratoses one on the left mid back in the bra line is more pigmented but still appears benign but since it is itching her I recommended that she have removed. The one on her left breast is very benign and not an issue. She has no masses in either breast. She has no lymphadenopathy. Heart shows a regular rhythm and rate without murmur rub or gallop. Abdomen shows no organomegaly. Bowel sounds are normal. She has no obvious ascites. She has no leg edema and no arm edema.  She looks great and we will see her back in another year. We do need to  check on her calcium level since it was mildly elevated last year. I think she was taking does too much oral calcium.

## 2012-11-19 NOTE — Patient Instructions (Addendum)
[facility] Discharge Instructions  RECOMMENDATIONS MADE BY THE CONSULTANT AND ANY TEST RESULTS WILL BE SENT TO YOUR REFERRING PHYSICIAN.  EXAM FINDINGS BY THE PHYSICIAN TODAY AND SIGNS OR SYMPTOMS TO REPORT TO CLINIC OR PRIMARY PHYSICIAN: Exam and discussion by MD.  Lesion on back and chest are keratoses and are benign.  No evidence of recurrence by exam.  MEDICATIONS PRESCRIBED:  None.  INSTRUCTIONS GIVEN AND DISCUSSED: Report any new lumps, bone pain or shortness of breath.  SPECIAL INSTRUCTIONS/FOLLOW-UP: Return in 1 year for follow-up.  Thank you for choosing [facility] to provide your oncology and hematology care.  To afford each patient quality time with our providers, please arrive at least 15 minutes before your scheduled appointment time.  With your help, our goal is to use those 15 minutes to complete the necessary work-up to ensure our physicians have the information they need to help with your evaluation and healthcare recommendations.    Effective January 1st, 2014, we ask that you re-schedule your appointment with our physicians should you arrive 10 or more minutes late for your appointment.  We strive to give you quality time with our providers, and arriving late affects you and other patients whose appointments are after yours.    Again, thank you for choosing [facility].  Our hope is that these requests will decrease the amount of time that you wait before being seen by our physicians.       _____________________________________________________________  Should you have questions after your visit to [facility], please contact our office at [phone] between the hours of 8:30 a.m. and 5:00 p.m.  Voicemails left after 4:30 p.m. will not be returned until the following business day.  For prescription refill requests, have your pharmacy contact our office with your prescription refill request.

## 2012-11-20 NOTE — Addendum Note (Signed)
Addended by: Evelena Leyden on: 11/20/2012 09:20 AM   Modules accepted: Medications

## 2012-11-26 ENCOUNTER — Ambulatory Visit (HOSPITAL_COMMUNITY)
Admission: RE | Admit: 2012-11-26 | Discharge: 2012-11-26 | Disposition: A | Discharge status: Home / Self Care | Payer: Medicare Other | Source: Ambulatory Visit | Attending: Family Medicine | Admitting: Family Medicine

## 2012-11-26 ENCOUNTER — Other Ambulatory Visit (HOSPITAL_COMMUNITY): Payer: Self-pay | Admitting: Family Medicine

## 2012-11-26 ENCOUNTER — Other Ambulatory Visit (HOSPITAL_COMMUNITY): Payer: Self-pay | Admitting: Oncology

## 2012-11-26 DIAGNOSIS — R059 Cough, unspecified: Secondary | ICD-10-CM | POA: Insufficient documentation

## 2012-11-26 DIAGNOSIS — R05 Cough: Secondary | ICD-10-CM

## 2012-11-26 DIAGNOSIS — Z139 Encounter for screening, unspecified: Secondary | ICD-10-CM

## 2012-12-12 ENCOUNTER — Other Ambulatory Visit (HOSPITAL_COMMUNITY): Payer: Self-pay | Admitting: Family Medicine

## 2012-12-12 DIAGNOSIS — R413 Other amnesia: Secondary | ICD-10-CM

## 2012-12-12 DIAGNOSIS — F0390 Unspecified dementia without behavioral disturbance: Secondary | ICD-10-CM

## 2012-12-12 DIAGNOSIS — R51 Headache: Secondary | ICD-10-CM

## 2012-12-16 ENCOUNTER — Encounter (HOSPITAL_COMMUNITY): Payer: Self-pay

## 2012-12-16 ENCOUNTER — Ambulatory Visit (HOSPITAL_COMMUNITY)
Admission: RE | Admit: 2012-12-16 | Discharge: 2012-12-16 | Disposition: A | Discharge status: Home / Self Care | Payer: Medicare Other | Source: Ambulatory Visit | Attending: Family Medicine | Admitting: Family Medicine

## 2012-12-16 DIAGNOSIS — R413 Other amnesia: Secondary | ICD-10-CM

## 2012-12-16 DIAGNOSIS — F0390 Unspecified dementia without behavioral disturbance: Secondary | ICD-10-CM | POA: Insufficient documentation

## 2012-12-16 DIAGNOSIS — G319 Degenerative disease of nervous system, unspecified: Secondary | ICD-10-CM | POA: Insufficient documentation

## 2012-12-16 DIAGNOSIS — R51 Headache: Secondary | ICD-10-CM

## 2013-01-02 ENCOUNTER — Ambulatory Visit (HOSPITAL_COMMUNITY)
Admission: RE | Admit: 2013-01-02 | Discharge: 2013-01-02 | Disposition: A | Discharge status: Home / Self Care | Payer: Medicare Other | Source: Ambulatory Visit | Attending: Oncology | Admitting: Oncology

## 2013-01-02 DIAGNOSIS — Z139 Encounter for screening, unspecified: Secondary | ICD-10-CM

## 2013-01-02 DIAGNOSIS — Z1231 Encounter for screening mammogram for malignant neoplasm of breast: Secondary | ICD-10-CM | POA: Insufficient documentation

## 2013-10-22 ENCOUNTER — Encounter: Payer: Self-pay | Admitting: Neurology

## 2013-10-24 ENCOUNTER — Encounter (INDEPENDENT_AMBULATORY_CARE_PROVIDER_SITE_OTHER): Payer: Self-pay

## 2013-10-24 ENCOUNTER — Encounter: Payer: Self-pay | Admitting: Neurology

## 2013-10-24 ENCOUNTER — Ambulatory Visit (INDEPENDENT_AMBULATORY_CARE_PROVIDER_SITE_OTHER): Payer: Medicare HMO | Admitting: Neurology

## 2013-10-24 VITALS — BP 142/82 | HR 78 | Ht 62.0 in | Wt 235.0 lb

## 2013-10-24 DIAGNOSIS — R413 Other amnesia: Secondary | ICD-10-CM

## 2013-10-24 DIAGNOSIS — D518 Other vitamin B12 deficiency anemias: Secondary | ICD-10-CM

## 2013-10-24 HISTORY — DX: Other amnesia: R41.3

## 2013-10-24 MED ORDER — DONEPEZIL HCL 10 MG PO TABS: 10.0000 mg | ORAL_TABLET | Freq: Every day | ORAL | Status: DC

## 2013-10-24 NOTE — Progress Notes (Signed)
Reason for visit: Memory disorder  Kari Bullock is a 73 y.o. female  History of present illness:  Kari Bullock is a 73 year old right-handed white female with a history of a mild memory disturbance that began approximately one year ago. The patient indicated that her family began noticing that she was repeating herself frequently. The patient believes that the memory problems progressed for a while, but the patient on Aricept, and she seemed to improve on this medication. On 5 mg at night, the patient indicates that she has had some problems with dizziness that occurs when she is standing up. The patient indicates that over time, the dizziness has become less frequent, occurring once every 2 weeks or so. The patient has not had any blackout episodes. The patient is having some problems with short-term memory, and problems remembering names for people and things. The patient has had occasional episodes of problems with directions while driving. The patient does the finances without difficulty, and she is able to keep up with her medications and appointments. The patient denies any fatigue. The patient has had some problems with insomnia, but she takes a small drink of wine before she goes to bed, and this helps her get to sleep. The patient has had no significant balance issues or falls. The patient reports no weakness of the extremities or problems controlling the bowels or the bladder. The patient has had a CT scan of the head that shows mild atrophy, and an EEG study that showed some mild slowing. The patient comes to this office for an evaluation.  Past Medical History  Diagnosis Date  . Hypertension   . Breast cancer     2001 RT breat lumpectomy rad tx  . GERD (gastroesophageal reflux disease)   . Memory loss   . Pure hypercholesterolemia   . Memory change 10/24/2013    Past Surgical History  Procedure Laterality Date  . Abdominal hysterectomy    . Bilateral oophorectomy  2007  .  Incision and drainage of wound  2011    buttocks  . Tonsillectomy    . Cataract extraction Bilateral   . Carpal tunnel release Right     Family History  Problem Relation Age of Onset  . Stroke Mother   . Cancer Maternal Aunt   . Cancer Maternal Grandmother   . Heart attack Father   . Cancer Brother     pancreatic    Social history:  reports that she quit smoking about 13 years ago. Her smoking use included Cigarettes. She has a 20 pack-year smoking history. She has never used smokeless tobacco. She reports that she drinks alcohol. She reports that she does not use illicit drugs.  Medications:  Current Outpatient Prescriptions on File Prior to Visit  Medication Sig Dispense Refill  . aspirin 81 MG tablet Take 81 mg by mouth daily.      . calcium carbonate (OS-CAL) 600 MG TABS Take 600 mg by mouth daily.       . Cholecalciferol (VITAMIN D-3 PO) Take 1,000 Units by mouth daily.       . diphenhydrAMINE (BENADRYL) 25 MG tablet Take 25 mg by mouth at bedtime as needed for itching.      . fish oil-omega-3 fatty acids 1000 MG capsule Take 2 g by mouth daily.      Marland Kitchen losartan-hydrochlorothiazide (HYZAAR) 50-12.5 MG per tablet Take 1 tablet by mouth daily.       Marland Kitchen omeprazole (PRILOSEC) 20 MG capsule Take 20  mg by mouth daily.       . pravastatin (PRAVACHOL) 10 MG tablet Take 10 mg by mouth daily.        No current facility-administered medications on file prior to visit.      Allergies  Allergen Reactions  . Codeine Nausea And Vomiting  . Morphine And Related Itching  . Sulfur Rash  . Tamoxifen Other (See Comments)    Blurred vision and dizziness    ROS:  Out of a complete 14 system review of symptoms, the patient complains only of the following symptoms, and all other reviewed systems are negative.  Easy bruising Moles Confusion, dizziness Insomnia  Blood pressure 142/82, pulse 78, height 5\' 2"  (1.575 m), weight 235 lb (106.595 kg).  Physical Exam  General: The patient  is alert and cooperative at the time of the examination. The patient is moderately obese.  Eyes: Pupils are equal, round, and reactive to light. Discs are flat bilaterally.  Neck: The neck is supple, no carotid bruits are noted.  Respiratory: The respiratory examination is clear.  Cardiovascular: The cardiovascular examination reveals a regular rate and rhythm, no obvious murmurs or rubs are noted.  Skin: Extremities are without significant edema.  Neurologic Exam  Mental status: The patient is alert and oriented x 3 at the time of the examination. The patient has apparent normal recent and remote memory, with an apparently normal attention span and concentration ability. Mini-Mental status examination done today shows a total score of 30/30.  Cranial nerves: Facial symmetry is present. There is good sensation of the face to pinprick and soft touch bilaterally. The strength of the facial muscles and the muscles to head turning and shoulder shrug are normal bilaterally. Speech is well enunciated, no aphasia or dysarthria is noted. Extraocular movements are full. Visual fields are full. The tongue is midline, and the patient has symmetric elevation of the soft palate. No obvious hearing deficits are noted.  Motor: The motor testing reveals 5 over 5 strength of all 4 extremities. Good symmetric motor tone is noted throughout.  Sensory: Sensory testing is intact to pinprick, soft touch, vibration sensation, and position sense on all 4 extremities, with exception of a stocking pattern pinprick sensory deficit one half way up the legs below the knees bilaterally. Vibration sensation is depressed on the right foot relative to the left. No evidence of extinction is noted.  Coordination: Cerebellar testing reveals good finger-nose-finger and heel-to-shin bilaterally.  Gait and station: Gait is normal. Tandem gait is slightly unsteady. Romberg is negative. No drift is seen.  Reflexes: Deep tendon  reflexes are symmetric, but are slightly depressed bilaterally. Toes are downgoing bilaterally.   Assessment/Plan:  1. Memory disturbance  The patient will be sent for further blood work today. The patient will be tried on a 10 mg dose of Aricept, but if the higher dose cannot be tolerated secondary to dizziness, the patient will drop back to a 5 mg dose. The patient reports some problems with emotional lability which could represent a pseudo-bulbar state, but the patient may have some mild depression. A trial on Wellbutrin may be indicated. The patient will followup in 6 months.  Jill Alexanders MD 10/24/2013 6:58 PM  [facility] [address] [place], [place] [address]  Phone [phone] Fax [phone]

## 2013-10-27 ENCOUNTER — Other Ambulatory Visit (INDEPENDENT_AMBULATORY_CARE_PROVIDER_SITE_OTHER): Payer: Self-pay

## 2013-10-27 DIAGNOSIS — Z0289 Encounter for other administrative examinations: Secondary | ICD-10-CM

## 2013-10-30 LAB — METHYLMALONIC ACID, SERUM: Methylmalonic Acid: 204 nmol/L (ref 0–378)

## 2013-10-30 LAB — RPR: RPR: NONREACTIVE

## 2013-10-30 LAB — VITAMIN B12: Vitamin B-12: 475 pg/mL (ref 211–946)

## 2013-10-30 NOTE — Progress Notes (Signed)
Quick Note:  Shared unremarkable lab results with husband, he said that he would give patient the message. ______

## 2013-11-18 ENCOUNTER — Other Ambulatory Visit (HOSPITAL_COMMUNITY): Payer: Self-pay | Admitting: Oncology

## 2013-11-18 DIAGNOSIS — Z139 Encounter for screening, unspecified: Secondary | ICD-10-CM

## 2013-11-19 ENCOUNTER — Ambulatory Visit (HOSPITAL_COMMUNITY): Payer: Medicare Other

## 2013-11-20 NOTE — Progress Notes (Signed)
This encounter was created in error - please disregard.

## 2014-01-05 ENCOUNTER — Other Ambulatory Visit (HOSPITAL_COMMUNITY): Payer: Self-pay | Admitting: Oncology

## 2014-01-05 ENCOUNTER — Ambulatory Visit (HOSPITAL_COMMUNITY)
Admission: RE | Admit: 2014-01-05 | Discharge: 2014-01-05 | Disposition: A | Discharge status: Home / Self Care | Payer: Medicare HMO | Source: Ambulatory Visit | Attending: Oncology | Admitting: Oncology

## 2014-01-05 DIAGNOSIS — Z139 Encounter for screening, unspecified: Secondary | ICD-10-CM

## 2014-01-05 DIAGNOSIS — Z1231 Encounter for screening mammogram for malignant neoplasm of breast: Secondary | ICD-10-CM | POA: Insufficient documentation

## 2014-01-07 ENCOUNTER — Ambulatory Visit (HOSPITAL_COMMUNITY): Payer: Medicare HMO

## 2014-01-12 ENCOUNTER — Encounter (HOSPITAL_COMMUNITY): Payer: Medicare HMO | Attending: Hematology and Oncology

## 2014-01-12 ENCOUNTER — Encounter (HOSPITAL_COMMUNITY): Payer: Self-pay

## 2014-01-12 VITALS — BP 141/79 | HR 58 | Temp 97.7°F | Resp 18 | Wt 181.0 lb

## 2014-01-12 DIAGNOSIS — K219 Gastro-esophageal reflux disease without esophagitis: Secondary | ICD-10-CM | POA: Insufficient documentation

## 2014-01-12 DIAGNOSIS — Z8614 Personal history of Methicillin resistant Staphylococcus aureus infection: Secondary | ICD-10-CM

## 2014-01-12 DIAGNOSIS — Z9010 Acquired absence of unspecified breast and nipple: Secondary | ICD-10-CM | POA: Insufficient documentation

## 2014-01-12 DIAGNOSIS — C50919 Malignant neoplasm of unspecified site of unspecified female breast: Secondary | ICD-10-CM

## 2014-01-12 DIAGNOSIS — Z853 Personal history of malignant neoplasm of breast: Secondary | ICD-10-CM

## 2014-01-12 DIAGNOSIS — Z170 Estrogen receptor positive status [ER+]: Secondary | ICD-10-CM

## 2014-01-12 DIAGNOSIS — Z09 Encounter for follow-up examination after completed treatment for conditions other than malignant neoplasm: Secondary | ICD-10-CM | POA: Insufficient documentation

## 2014-01-12 DIAGNOSIS — E669 Obesity, unspecified: Secondary | ICD-10-CM | POA: Insufficient documentation

## 2014-01-12 DIAGNOSIS — Z923 Personal history of irradiation: Secondary | ICD-10-CM | POA: Insufficient documentation

## 2014-01-12 DIAGNOSIS — E7800 Pure hypercholesterolemia, unspecified: Secondary | ICD-10-CM | POA: Insufficient documentation

## 2014-01-12 DIAGNOSIS — M169 Osteoarthritis of hip, unspecified: Secondary | ICD-10-CM | POA: Insufficient documentation

## 2014-01-12 DIAGNOSIS — I10 Essential (primary) hypertension: Secondary | ICD-10-CM | POA: Insufficient documentation

## 2014-01-12 DIAGNOSIS — F0390 Unspecified dementia without behavioral disturbance: Secondary | ICD-10-CM | POA: Insufficient documentation

## 2014-01-12 DIAGNOSIS — M1610 Unilateral primary osteoarthritis, unspecified hip: Secondary | ICD-10-CM | POA: Insufficient documentation

## 2014-01-12 DIAGNOSIS — Z87891 Personal history of nicotine dependence: Secondary | ICD-10-CM | POA: Insufficient documentation

## 2014-01-12 LAB — COMPREHENSIVE METABOLIC PANEL
ALK PHOS: 75 U/L (ref 39–117)
ALT: 23 U/L (ref 0–35)
AST: 27 U/L (ref 0–37)
Albumin: 3.7 g/dL (ref 3.5–5.2)
BUN: 16 mg/dL (ref 6–23)
CO2: 28 mEq/L (ref 19–32)
Calcium: 10.7 mg/dL — ABNORMAL HIGH (ref 8.4–10.5)
Chloride: 99 mEq/L (ref 96–112)
Creatinine, Ser: 0.75 mg/dL (ref 0.50–1.10)
GFR calc Af Amer: >90 mL/min (ref >90)
GFR calc non Af Amer: 81 mL/min — ABNORMAL LOW (ref >90)
GLUCOSE: 100 mg/dL — AB (ref 70–99)
POTASSIUM: 3.9 meq/L (ref 3.7–5.3)
SODIUM: 139 meq/L (ref 137–147)
Total Bilirubin: 0.3 mg/dL (ref 0.3–1.2)
Total Protein: 7.1 g/dL (ref 6.0–8.3)

## 2014-01-12 LAB — CBC WITH DIFFERENTIAL/PLATELET
BASOS ABS: 0.1 10*3/uL (ref 0.0–0.1)
Basophils Relative: 1 % (ref 0–1)
EOS ABS: 0.3 10*3/uL (ref 0.0–0.7)
Eosinophils Relative: 5 % (ref 0–5)
HCT: 40.9 % (ref 36.0–46.0)
Hemoglobin: 13.3 g/dL (ref 12.0–15.0)
LYMPHS ABS: 2 10*3/uL (ref 0.7–4.0)
LYMPHS PCT: 28 % (ref 12–46)
MCH: 28.4 pg (ref 26.0–34.0)
MCHC: 32.5 g/dL (ref 30.0–36.0)
MCV: 87.4 fL (ref 78.0–100.0)
Monocytes Absolute: 0.5 10*3/uL (ref 0.1–1.0)
Monocytes Relative: 8 % (ref 3–12)
NEUTROS PCT: 58 % (ref 43–77)
Neutro Abs: 4.2 10*3/uL (ref 1.7–7.7)
Platelets: 204 10*3/uL (ref 150–400)
RBC: 4.68 MIL/uL (ref 3.87–5.11)
RDW: 13.5 % (ref 11.5–15.5)
WBC: 7.2 10*3/uL (ref 4.0–10.5)

## 2014-01-12 NOTE — Progress Notes (Signed)
Blood drawn from left ac. Patient tolerated well. 23 gauge needle used.

## 2014-01-12 NOTE — Patient Instructions (Signed)
[facility] Discharge Instructions  RECOMMENDATIONS MADE BY THE CONSULTANT AND ANY TEST RESULTS WILL BE SENT TO YOUR REFERRING PHYSICIAN.  We will see you in 1 year for a doctor's visit and lab work. Make sure you get your Mammogram before your next visit.   Please call us for any questions or concerns.  Thank you for choosing [facility] to provide your oncology and hematology care.  To afford each patient quality time with our providers, please arrive at least 15 minutes before your scheduled appointment time.  With your help, our goal is to use those 15 minutes to complete the necessary work-up to ensure our physicians have the information they need to help with your evaluation and healthcare recommendations.    Effective January 1st, 2014, we ask that you re-schedule your appointment with our physicians should you arrive 10 or more minutes late for your appointment.  We strive to give you quality time with our providers, and arriving late affects you and other patients whose appointments are after yours.    Again, thank you for choosing [facility].  Our hope is that these requests will decrease the amount of time that you wait before being seen by our physicians.       _____________________________________________________________  Should you have questions after your visit to [facility], please contact our office at [phone] between the hours of 8:30 a.m. and 5:00 p.m.  Voicemails left after 4:30 p.m. will not be returned until the following business day.  For prescription refill requests, have your pharmacy contact our office with your prescription refill request.

## 2014-01-12 NOTE — Progress Notes (Signed)
[facility]  OFFICE PROGRESS NOTE  Kari Hampshire, MD [place] [address]  DIAGNOSIS: Breast cancer - Plan: CEA, Cancer antigen 27.29, CBC with Differential, Comprehensive metabolic panel, CBC with Differential, Comprehensive metabolic panel, CEA, Cancer antigen 27.29, MM Digital Diagnostic Bilat  Chief Complaint  Patient presents with  . Breast Cancer    CURRENT THERAPY: Watchful expectation and surveillance.  INTERVAL HISTORY: Kari Bullock 73 y.o. female returns for followup of right breast cancer status post surgery, radiotherapy, and 5 years of adjuvant endocrine treatment ending on 02/15/2005.  She has felt somewhat cramps and had lab work done by her family physician last week. She was told her calcium was elevated. No recommendations were made regarding intervention. She denies  fever, night sweats, vaginal bleeding or discharge, bone pain, lower extremity swelling or redness, PND, orthopnea, palpitations, abnormaliti diarrhea, constipation, melena, hematochezia, skin rash, headache, or seizures.es on self breast examination, lymphedema,  MEDICAL HISTORY: Past Medical History  Diagnosis Date  . Hypertension   . Breast cancer     2001 RT breat lumpectomy rad tx  . GERD (gastroesophageal reflux disease)   . Memory loss   . Pure hypercholesterolemia   . Memory change 10/24/2013    INTERIM HISTORY: has Memory change on her problem list.   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 HER-2/neu was indeterminant at 2+. She was diagnosed in February 2001 treated with surgery followed by radiation therapy and then hormonal therapy. She could not tolerate tamoxifen so was switched to Arimidex which she took for total 5 years ending on 02/15/2005.    ALLERGIES:  is allergic to codeine;  morphine and related; sulfur; and tamoxifen.  MEDICATIONS: has a current medication list which includes the following prescription(s): aspirin, calcium carbonate, diphenhydramine, donepezil, fish oil-omega-3 fatty acids, losartan-hydrochlorothiazide, omeprazole, pravastatin, and cholecalciferol.  SURGICAL HISTORY:  Past Surgical History  Procedure Laterality Date  . Abdominal hysterectomy    . Bilateral oophorectomy  2007  . Incision and drainage of wound  2011    buttocks  . Tonsillectomy    . Cataract extraction Bilateral   . Carpal tunnel release Right     FAMILY HISTORY: family history includes Cancer in her brother, maternal aunt, and maternal grandmother; Heart attack in her father; Stroke in her mother.  SOCIAL HISTORY:  reports that she quit smoking about 14 years ago. Her smoking use included Cigarettes. She has a 20 pack-year smoking history. She has never used smokeless tobacco. She reports that she drinks alcohol. She reports that she does not use illicit drugs.  REVIEW OF SYSTEMS:  Other than that discussed above is noncontributory.  PHYSICAL EXAMINATION: ECOG PERFORMANCE STATUS: 1 - Symptomatic but completely ambulatory  Blood pressure 141/79, pulse 58, temperature 97.7 F (36.5 C), temperature source Oral, resp. rate 18, weight 181 lb (82.101 kg), SpO2 98.00%.  GENERAL:alert, no distress and comfortable SKIN: skin color, texture, turgor are normal, no rashes or significant lesions EYES: PERLA; Conjunctiva are pink and non-injected, sclera clear SINUSES: No redness or tenderness over maxillary or ethmoid sinuses OROPHARYNX:no exudate, no erythema on lips, buccal mucosa, or tongue. NECK: supple, thyroid normal size, non-tender, without nodularity. No masses CHEST:  status post left breast lumpectomy with no masses in either breast. Right breast with  an area of fatty necrosis.  LYMPH:  no palpable lymphadenopathy in the cervical, axillary or inguinal LUNGS: clear to  auscultation and percussion with normal breathing effort HEART: regular rate & rhythm and no murmurs. ABDOMEN:abdomen soft, non-tender and normal bowel sounds MUSCULOSKELETAL:no cyanosis of digits and no clubbing. Range of motion normal.  NEURO: alert & oriented x 3 with fluent speech, no focal motor/sensory deficits   LABORATORY DATA: Office Visit on 01/12/2014  Component Date Value Ref Range Status  . WBC 01/12/2014 7.2  4.0 - 10.5 K/uL Final  . RBC 01/12/2014 4.68  3.87 - 5.11 MIL/uL Final  . Hemoglobin 01/12/2014 13.3  12.0 - 15.0 g/dL Final  . HCT 01/12/2014 40.9  36.0 - 46.0 % Final  . MCV 01/12/2014 87.4  78.0 - 100.0 fL Final  . MCH 01/12/2014 28.4  26.0 - 34.0 pg Final  . MCHC 01/12/2014 32.5  30.0 - 36.0 g/dL Final  . RDW 01/12/2014 13.5  11.5 - 15.5 % Final  . Platelets 01/12/2014 204  150 - 400 K/uL Final  . Neutrophils Relative % 01/12/2014 58  43 - 77 % Final  . Neutro Abs 01/12/2014 4.2  1.7 - 7.7 K/uL Final  . Lymphocytes Relative 01/12/2014 28  12 - 46 % Final  . Lymphs Abs 01/12/2014 2.0  0.7 - 4.0 K/uL Final  . Monocytes Relative 01/12/2014 8  3 - 12 % Final  . Monocytes Absolute 01/12/2014 0.5  0.1 - 1.0 K/uL Final  . Eosinophils Relative 01/12/2014 5  0 - 5 % Final  . Eosinophils Absolute 01/12/2014 0.3  0.0 - 0.7 K/uL Final  . Basophils Relative 01/12/2014 1  0 - 1 % Final  . Basophils Absolute 01/12/2014 0.1  0.0 - 0.1 K/uL Final    PATHOLOGY: no new pathology.   Urinalysis    Component Value Date/Time   COLORURINE YELLOW 12/21/2008 1828   APPEARANCEUR HAZY* 12/21/2008 1828   LABSPEC 1.015 12/21/2008 1828   PHURINE 7.0 12/21/2008 1828   GLUCOSEU NEGATIVE 12/21/2008 1828   HGBUR NEGATIVE 12/21/2008 1828   BILIRUBINUR NEGATIVE 12/21/2008 1828   KETONESUR NEGATIVE 12/21/2008 1828   PROTEINUR TRACE* 12/21/2008 1828   UROBILINOGEN 0.2 12/21/2008 1828   NITRITE NEGATIVE 12/21/2008 1828   LEUKOCYTESUR SMALL* 12/21/2008 1828    RADIOGRAPHIC STUDIES: Mm Digital Screening  Bilateral  01/07/2014   CLINICAL DATA:  Screening. History of right lumpectomy and radiation treatment in 2001.  EXAM: DIGITAL SCREENING BILATERAL MAMMOGRAM WITH CAD  COMPARISON:  Previous exam(s).  ACR Breast Density Category b: There are scattered areas of fibroglandular density.  FINDINGS: There are no findings suspicious for malignancy. Postoperative changes are seen in the right breast. Images were processed with CAD.  IMPRESSION: No mammographic evidence of malignancy. A result letter of this screening mammogram will be mailed directly to the patient.  RECOMMENDATION: Screening mammogram in one year. (Code:SM-B-01Y)  BI-RADS CATEGORY  1: Negative.   Electronically Signed   By: Shon Hale M.D.   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 HER-2/neu was indeterminant at 2+. She was diagnosed in February 2001 treated with surgery followed by radiation therapy and then hormonal therapy. She could not tolerate tamoxifen so was switched to Arimidex which she took for total 5 years ending on 02/15/2005, no evidence  of disease. #2. Hypercalcemia, for repeat value today, pretty documented in 2013 as well, probably due 2 thiazide diuretic use. 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 difficile diarrhea during treatment for the above MRSA  #6 left hip degenerative joint disease unable to go up stairs easily  #7 GERD  #8 obesity  #9 irregular heartbeat  #10 hypertension  #11. Early dementia, on treatment.    PLAN:  #1. Decrease calcium supplements to one every other day. #2. Followup in one year preceded by bilateral mammography. Patient was told to call should she develop any new symptoms that are troublesome and persistent so that  an earlier appointment could be made.    All questions were answered. The patient knows to call the clinic with any problems, questions or concerns. We can certainly see the patient much sooner if necessary.   I spent 25 minutes counseling the patient face to face. The total time spent in the appointment was 30 minutes.    Farrel Gobble, MD 01/12/2014 4:28 PM  DISCLAIMER:  This note was dictated with voice recognition software.  Similar sounding words can inadvertently be transcribed inaccurately and may not be corrected upon review.

## 2014-01-13 LAB — CANCER ANTIGEN 27.29: CA 27.29: 27 U/mL (ref 0–39)

## 2014-01-13 LAB — CEA: CEA: 2.2 ng/mL (ref 0.0–5.0)

## 2014-04-23 ENCOUNTER — Ambulatory Visit (INDEPENDENT_AMBULATORY_CARE_PROVIDER_SITE_OTHER): Payer: Medicare HMO | Admitting: Nurse Practitioner

## 2014-04-23 ENCOUNTER — Encounter: Payer: Self-pay | Admitting: Nurse Practitioner

## 2014-04-23 VITALS — BP 159/76 | HR 58 | Ht 65.0 in | Wt 179.0 lb

## 2014-04-23 DIAGNOSIS — R413 Other amnesia: Secondary | ICD-10-CM

## 2014-04-23 NOTE — Patient Instructions (Signed)
I think overall you are doing fairly well but I do want to suggest a few things today:  Remember to drink plenty of fluid, eat healthy meals and do not skip any meals. Try to eat protein with a every meal and eat a healthy snack such as fruit or nuts in between meals. Try to keep a regular sleep-wake schedule and try to exercise daily, particularly in the form of walking, 20-30 minutes a day, if you can. Good nutrition, proper sleep and exercise can help her cognitive function.  Engage in social activities in your community and with your family and try to keep up with current events by reading the newspaper or watching the news. If you have computer and can go online, try [email or web address]. Also, you may like to do word finding puzzles or crossword puzzles.  As far as your medications are concerned, I would like to suggest Continue Aricpet 10 mg daily.  As far as diagnostic testing: none needed at this time.  I would like to see you back in  6 months, sooner if we need to. Please call us with any interim questions, concerns, problems, updates or refill requests.  Please also call us for any test results so we can go over those with you on the phone. Richardson Landry is my clinical assistant and will answer any of your questions and relay your messages to me and also relay most of my messages to you.  Our phone number is [phone]. We also have an after hours call service for urgent matters and there is a physician on-call for urgent questions. For any emergencies you know to call 911 or go to the nearest emergency room.

## 2014-04-23 NOTE — Progress Notes (Signed)
PATIENT: Kari Bullock DOB: 1940/09/25  REASON FOR VISIT: routine follow up for memory loss HISTORY FROM: patient  HISTORY OF PRESENT ILLNESS: Kari Bullock is a 73 year old right-handed white female with a history of a mild memory disturbance that began approximately one year ago. The patient indicated that her family began noticing that she was repeating herself frequently. The patient believes that the memory problems progressed for a while, but the patient on Aricept, and she seemed to improve on this medication. On 5 mg at night, the patient indicates that she has had some problems with dizziness that occurs when she is standing up. The patient indicates that over time, the dizziness has become less frequent, occurring once every 2 weeks or so. The patient has not had any blackout episodes. The patient is having some problems with short-term memory, and problems remembering names for people and things. The patient has had occasional episodes of problems with directions while driving. The patient does the finances without difficulty, and she is able to keep up with her medications and appointments. The patient denies any fatigue. The patient has had some problems with insomnia, but she takes a small drink of wine before she goes to bed, and this helps her get to sleep. The patient has had no significant balance issues or falls. The patient reports no weakness of the extremities or problems controlling the bowels or the bladder. The patient has had a CT scan of the head that shows mild atrophy, and an EEG study that showed some mild slowing. The patient comes to this office for an evaluation.  Update 04/23/14 (LL): Patient comes to the office today accompanied by her husband. Since last visit patient states that she has been doing better. Labs drawn at last visit including Methylmalonic Acid, RPR, and B12 were all normal. She is tolerated increase of Aricept to 10 mg each night well without any side  effects.  Since changing to taking the medicine at night she states she is sleeping much better and more soundly. Her husband states that her memory is stable and probably doing a little bit better. There is been no functional loss since last visit. MMSE today is 28/30, AFT 17, clock drawing test 4/4, geriatric depression scale score 1.  ROS: Out of a complete 14 system review of symptoms, the patient complains only of the following symptoms, and all other reviewed systems are negative. Back pain, muscle cramps   ALLERGIES: Allergies  Allergen Reactions  . Codeine Nausea And Vomiting  . Morphine And Related Itching  . Sulfur Rash  . Tamoxifen Other (See Comments)    Blurred vision and dizziness    HOME MEDICATIONS: Outpatient Prescriptions Prior to Visit  Medication Sig Dispense Refill  . aspirin 81 MG tablet Take 81 mg by mouth daily.      . calcium carbonate (OS-CAL) 600 MG TABS Take 600 mg by mouth daily.       . Cholecalciferol (VITAMIN D-3 PO) Take 1,000 Units by mouth daily.       . diphenhydrAMINE (BENADRYL) 25 MG tablet Take 25 mg by mouth at bedtime as needed for itching.      . donepezil (ARICEPT) 10 MG tablet Take 1 tablet (10 mg total) by mouth at bedtime.  90 tablet  3  . fish oil-omega-3 fatty acids 1000 MG capsule Take 2 g by mouth daily.      Marland Kitchen losartan-hydrochlorothiazide (HYZAAR) 50-12.5 MG per tablet Take 1 tablet by mouth daily.       Marland Kitchen  omeprazole (PRILOSEC) 20 MG capsule Take 20 mg by mouth daily.       . pravastatin (PRAVACHOL) 10 MG tablet Take 10 mg by mouth daily.        No facility-administered medications prior to visit.    PHYSICAL EXAM Filed Vitals:   04/23/14 1050  BP: 159/76  Pulse: 58  Height: 5\' 5"  (1.651 m)  Weight: 179 lb (81.194 kg)   Body mass index is 29.79 kg/(m^2).  MMSE - Mini Mental State Exam 04/23/2014  Orientation to time 4  Orientation to Place 5  Registration 3  Attention/ Calculation 4  Recall 2  Language- name 2 objects 2   Language- repeat 1  Language- follow 3 step command 3  Language- read & follow direction 1  Write a sentence 1  Copy design 1  Total score 27   Physical Exam General: The patient is alert and cooperative at the time of the examination. The patient is moderately obese. Eyes: Pupils are equal, round, and reactive to light. Discs are flat bilaterally. Neck: The neck is supple, no carotid bruits are noted. Respiratory: The respiratory examination is clear. Cardiovascular: The cardiovascular examination reveals a regular rate and rhythm, no obvious murmurs or rubs are noted. Skin: Extremities are without significant edema.  Neurologic Exam Mental status: The patient is alert and oriented x 3 at the time of the examination. The patient has apparent normal recent and remote memory, with an apparently normal attention span and concentration ability. Mini-Mental status examination done 6 months ago shows a total score of 30/30. Cranial nerves: Facial symmetry is present. There is good sensation of the face to pinprick and soft touch bilaterally. The strength of the facial muscles and the muscles to head turning and shoulder shrug are normal bilaterally. Speech is well enunciated, no aphasia or dysarthria is noted. Extraocular movements are full. Visual fields are full. The tongue is midline, and the patient has symmetric elevation of the soft palate. No obvious hearing deficits are noted. Motor: The motor testing reveals 5 over 5 strength of all 4 extremities. Good symmetric motor tone is noted throughout. Sensory: Sensory testing is intact to pinprick, soft touch, vibration sensation, and position sense on all 4 extremities, with exception of a stocking pattern pinprick sensory deficit one half way up the legs below the knees bilaterally. Vibration sensation is depressed on the right foot relative to the left. No evidence of extinction is noted. Coordination: Cerebellar testing reveals good  finger-nose-finger and heel-to-shin bilaterally. Gait and station: Gait is normal. Tandem gait is slightly unsteady. Romberg is negative. No drift is seen. Reflexes: Deep tendon reflexes are symmetric, but are slightly depressed bilaterally. Toes are downgoing bilaterally.  ASSESSMENT: 73 y.o. year old female  has a past medical history of Hypertension; Breast cancer; GERD (gastroesophageal reflux disease); Memory loss; Pure hypercholesterolemia; and Memory change (10/24/2013). here with: 1. Memory disturbance  PLAN: Continue Aricpet 10 mg daily at bedtime. Advised patient on regular exercise, healthy meals, engaging in mentally challenging and social activities. Follow up in  6 months, sooner as needed.  Rudi Rummage Braeson Rupe, MSN, FNP-BC, A/GNP-C 04/23/2014, 1:12 PM [facility] [address], [place], [place] [address] [phone]  Note: This document was prepared with digital dictation and possible smart phrase technology. Any transcriptional errors that result from this process are unintentional.

## 2014-08-05 ENCOUNTER — Encounter: Payer: Self-pay | Admitting: Neurology

## 2014-08-11 ENCOUNTER — Encounter: Payer: Self-pay | Admitting: Neurology

## 2014-09-01 ENCOUNTER — Emergency Department (HOSPITAL_COMMUNITY)
Admission: EM | Admit: 2014-09-01 | Discharge: 2014-09-01 | Disposition: A | Discharge status: Home / Self Care | Payer: Medicare HMO | Attending: Emergency Medicine | Admitting: Emergency Medicine

## 2014-09-01 ENCOUNTER — Emergency Department (HOSPITAL_COMMUNITY): Payer: Medicare HMO

## 2014-09-01 ENCOUNTER — Encounter (HOSPITAL_COMMUNITY): Payer: Self-pay | Admitting: Emergency Medicine

## 2014-09-01 DIAGNOSIS — R079 Chest pain, unspecified: Secondary | ICD-10-CM | POA: Insufficient documentation

## 2014-09-01 DIAGNOSIS — Z79899 Other long term (current) drug therapy: Secondary | ICD-10-CM | POA: Insufficient documentation

## 2014-09-01 DIAGNOSIS — I10 Essential (primary) hypertension: Secondary | ICD-10-CM | POA: Insufficient documentation

## 2014-09-01 DIAGNOSIS — E780 Pure hypercholesterolemia: Secondary | ICD-10-CM | POA: Diagnosis not present

## 2014-09-01 DIAGNOSIS — K219 Gastro-esophageal reflux disease without esophagitis: Secondary | ICD-10-CM | POA: Diagnosis not present

## 2014-09-01 DIAGNOSIS — Z853 Personal history of malignant neoplasm of breast: Secondary | ICD-10-CM | POA: Diagnosis not present

## 2014-09-01 DIAGNOSIS — Z87891 Personal history of nicotine dependence: Secondary | ICD-10-CM | POA: Insufficient documentation

## 2014-09-01 DIAGNOSIS — R42 Dizziness and giddiness: Secondary | ICD-10-CM | POA: Diagnosis not present

## 2014-09-01 DIAGNOSIS — M549 Dorsalgia, unspecified: Secondary | ICD-10-CM | POA: Diagnosis not present

## 2014-09-01 LAB — CBC WITH DIFFERENTIAL/PLATELET
BASOS PCT: 1 % (ref 0–1)
Basophils Absolute: 0.1 10*3/uL (ref 0.0–0.1)
EOS PCT: 2 % (ref 0–5)
Eosinophils Absolute: 0.1 10*3/uL (ref 0.0–0.7)
HCT: 40.3 % (ref 36.0–46.0)
HEMOGLOBIN: 13.1 g/dL (ref 12.0–15.0)
Lymphocytes Relative: 26 % (ref 12–46)
Lymphs Abs: 1.6 10*3/uL (ref 0.7–4.0)
MCH: 28.1 pg (ref 26.0–34.0)
MCHC: 32.5 g/dL (ref 30.0–36.0)
MCV: 86.5 fL (ref 78.0–100.0)
MONO ABS: 0.4 10*3/uL (ref 0.1–1.0)
Monocytes Relative: 7 % (ref 3–12)
NEUTROS ABS: 4 10*3/uL (ref 1.7–7.7)
Neutrophils Relative %: 64 % (ref 43–77)
Platelets: 202 10*3/uL (ref 150–400)
RBC: 4.66 MIL/uL (ref 3.87–5.11)
RDW: 13.4 % (ref 11.5–15.5)
WBC: 6.2 10*3/uL (ref 4.0–10.5)

## 2014-09-01 LAB — BASIC METABOLIC PANEL
ANION GAP: 14 (ref 5–15)
BUN: 14 mg/dL (ref 6–23)
CHLORIDE: 99 meq/L (ref 96–112)
CO2: 25 mEq/L (ref 19–32)
Calcium: 10.5 mg/dL (ref 8.4–10.5)
Creatinine, Ser: 0.64 mg/dL (ref 0.50–1.10)
GFR calc Af Amer: >90 mL/min (ref >90)
GFR calc non Af Amer: 86 mL/min — ABNORMAL LOW (ref >90)
Glucose, Bld: 107 mg/dL — ABNORMAL HIGH (ref 70–99)
Potassium: 3.9 mEq/L (ref 3.7–5.3)
Sodium: 138 mEq/L (ref 137–147)

## 2014-09-01 LAB — TROPONIN I
Troponin I: <0.3 ng/mL (ref <0.30)
Troponin I: <0.3 ng/mL (ref <0.30)

## 2014-09-01 NOTE — Discharge Instructions (Signed)
I have offered you admission to the hospital - you have chosen to follow up as an outpatient.  Your testing has not shown any signs of heart attack but you may have blockages in your heart that cause this pain - if you should develop severe or worsening pain, return to the ER immediately.  Please call your doctor for a followup appointment within 24-48 hours. When you talk to your doctor please let them know that you were seen in the emergency department and have them acquire all of your records so that they can discuss the findings with you and formulate a treatment plan to fully care for your new and ongoing problems.

## 2014-09-01 NOTE — ED Notes (Signed)
Pt c/o cp that woke her up out of her sleep at 0100 this am with sweating/neasea and dizziness. Pt sent by Dr. Everette Rank.

## 2014-09-01 NOTE — ED Provider Notes (Signed)
CSN: [id number]     Arrival date & time 09/01/14  2703 History  This chart was scribed for Kari Acosta, MD by Edison Simon, ED Scribe. This patient was seen in room APA05/APA05 and the patient's care was started at 10:00 AM.    Chief Complaint  Patient presents with  . Chest Pain   The history is provided by the patient. No language interpreter was used.    Symptoms were acute in onset Symptoms are persistent Symptoms are improved with sitting up, ASA Made worse with nothing Associated symptoms include lightheadedness   HPI Comments: Kari Bullock is a 73 y.o. female who presents to the Emergency Department complaining of constant chest pain with onset at 0100 this morning that woke her. She states she got up and sat in her recliner and pain continued until 0600 when she took 2 baby ASA which improved pain. She states pain improves when sitting up. She reports associated lightheadedness when she walks. She states she does not exercise much and has not exercised significantly in the past few weeks. She notes that for the past year, she has back pain after activity that lasts approximately 12 hours and improves with Aleve. She reports history of breast CA 10 years ago that was treated with radiation. She states she was having some chest pain 5 years ago so she had a heart cath,which had benign results. The diagnosis was that she had had some damage to her lungs from cancer treatment. She states pain today is different. She states she used to smoke. She reports FHx of heart problems. She denies leg swelling.  Past Medical History  Diagnosis Date  . Hypertension   . Breast cancer     2001 RT breat lumpectomy rad tx  . GERD (gastroesophageal reflux disease)   . Memory loss   . Pure hypercholesterolemia   . Memory change 10/24/2013   Past Surgical History  Procedure Laterality Date  . Abdominal hysterectomy    . Bilateral oophorectomy  2007  . Incision and drainage of wound  2011     buttocks  . Tonsillectomy    . Cataract extraction Bilateral   . Carpal tunnel release Right    Family History  Problem Relation Age of Onset  . Stroke Mother   . Cancer Maternal Aunt   . Cancer Maternal Grandmother   . Heart attack Father   . Cancer Brother     pancreatic   History  Substance Use Topics  . Smoking status: Former Smoker -- 1.00 packs/day for 20 years    Types: Cigarettes    Quit date: 11/20/1999  . Smokeless tobacco: Never Used  . Alcohol Use: Yes     Comment: occasional wine   OB History    No data available     Review of Systems  Cardiovascular: Positive for chest pain. Negative for leg swelling.  Musculoskeletal: Positive for back pain.  Neurological: Positive for light-headedness.  All other systems reviewed and are negative.     Allergies  Codeine; Morphine and related; Sulfur; and Tamoxifen  Home Medications   Prior to Admission medications   Medication Sig Start Date End Date Taking? Authorizing Provider  aspirin 81 MG tablet Take 81 mg by mouth daily.   Yes Historical Provider, MD  calcium carbonate (OS-CAL) 600 MG TABS Take 600 mg by mouth daily.    Yes Historical Provider, MD  Cholecalciferol (VITAMIN D-3 PO) Take 1,000 Units by mouth daily.    Yes Historical  Provider, MD  diphenhydrAMINE (BENADRYL) 25 MG tablet Take 25 mg by mouth at bedtime as needed for itching.   Yes Historical Provider, MD  donepezil (ARICEPT) 10 MG tablet Take 1 tablet (10 mg total) by mouth at bedtime. 10/24/13  Yes Kathrynn Ducking, MD  fish oil-omega-3 fatty acids 1000 MG capsule Take 2 g by mouth daily.   Yes Historical Provider, MD  losartan-hydrochlorothiazide (HYZAAR) 50-12.5 MG per tablet Take 1 tablet by mouth daily.  09/30/11  Yes Historical Provider, MD  omeprazole (PRILOSEC) 20 MG capsule Take 20 mg by mouth daily.  10/04/11  Yes Historical Provider, MD  pravastatin (PRAVACHOL) 10 MG tablet Take 10 mg by mouth daily.  10/04/11  Yes Historical Provider, MD    BP 119/48 mmHg  Pulse 82  Resp 16  Ht 5\' 3"  (1.6 m)  Wt 167 lb (75.751 kg)  BMI 29.59 kg/m2  SpO2 100% Physical Exam  Constitutional: She appears well-developed and well-nourished. No distress.  HENT:  Head: Normocephalic and atraumatic.  Mouth/Throat: Oropharynx is clear and moist. No oropharyngeal exudate.  Eyes: Conjunctivae and EOM are normal. Pupils are equal, round, and reactive to light. Right eye exhibits no discharge. Left eye exhibits no discharge. No scleral icterus.  Neck: Normal range of motion. Neck supple. No JVD present. No thyromegaly present.  Cardiovascular: Normal rate, regular rhythm, normal heart sounds and intact distal pulses.  Exam reveals no gallop and no friction rub.   No murmur heard. Pulmonary/Chest: Effort normal and breath sounds normal. No respiratory distress. She has no wheezes. She has no rales.  Abdominal: Soft. Bowel sounds are normal. She exhibits no distension and no mass. There is no tenderness.  Musculoskeletal: Normal range of motion. She exhibits no edema or tenderness.  Lymphadenopathy:    She has no cervical adenopathy.  Neurological: She is alert. Coordination normal.  Skin: Skin is warm and dry. No rash noted. No erythema.  Psychiatric: She has a normal mood and affect. Her behavior is normal.  Nursing note and vitals reviewed.   ED Course  Procedures (including critical care time)  DIAGNOSTIC STUDIES: Oxygen Saturation is 99% on room air, normal by my interpretation.    COORDINATION OF CARE: 10:09 AM Discussed treatment plan with patient at beside, the patient agrees with the plan and has no further questions at this time. [place] - Abnormal; Notable for the following:    Glucose, Bld 107 (*)    GFR calc non Af Amer 86 (*)    All other components within normal limits  CBC WITH DIFFERENTIAL  TROPONIN I  TROPONIN I    Imaging Review Dg Chest 2 View  09/01/2014   CLINICAL  DATA:  Initial encounter. Central chest pain started this morning at approximately 1 a.m. Former smoker since 2002.  EXAM: CHEST  2 VIEW  COMPARISON:  11/26/2012  FINDINGS: There is mild elevation the right diaphragm, stable. There is no focal parenchymal opacity, pleural effusion, or pneumothorax. The heart and mediastinal contours are unremarkable.  Surgical clips in the right chest wall/axilla.  The osseous structures are unremarkable.  IMPRESSION: No active cardiopulmonary disease.   Electronically Signed   By: Kathreen Devoid   On: 09/01/2014 10:49     EKG Interpretation   Date/Time:  Tuesday September 01 2014 09:54:19 EST Ventricular Rate:  61 PR Interval:  153 QRS Duration: 104 QT Interval:  412 QTC Calculation: 415 R Axis:   56 Text  Interpretation:  Sinus rhythm Low voltage, precordial leads  Nonspecific T abnrm, anterolateral leads ECG OTHERWISE WITHIN NORMAL  LIMITS Since last tracing QRS widening has niormalized Confirmed by Eustolia Drennen   MD, Aryanah Enslow ([id number]) on 09/01/2014 9:56:24 AM      MDM   Final diagnoses:  Chest pain, unspecified chest pain type   The patient presented with chest pain, this was left-sided, it started last night. On exam she has some reproducible tenderness on the left chest wall, her vital signs are been perfect, her EKG was unremarkable, her EKG actually looks better today than it did in the past, she is chest pain-free after her second troponin, both troponins were normal, I have encouraged her to have further treatment and testing including a stress test, she requested do this as an outpatient and does not want to be admitted to the hospital. She is aware that she can return at any time for further testing, she will follow up closely with her family doctor for cardiology referral as she no longer is followed by a cardiologist. Of note she did have a clean heart catheterization within the last 5 years.    I personally performed the services described in this  documentation, which was scribed in my presence. The recorded information has been reviewed and is accurate.    Kari Acosta, MD 09/01/14 [phone]

## 2014-09-01 NOTE — ED Notes (Signed)
Lab at bedside

## 2014-09-28 ENCOUNTER — Other Ambulatory Visit: Payer: Self-pay | Admitting: Neurology

## 2014-09-29 ENCOUNTER — Other Ambulatory Visit: Payer: Self-pay

## 2014-09-29 MED ORDER — DONEPEZIL HCL 10 MG PO TABS: 10.0000 mg | ORAL_TABLET | Freq: Every day | ORAL | Status: DC

## 2014-09-29 NOTE — Telephone Encounter (Signed)
I spoke with patient who says the pharmacy has contacted [place] in error for this drug.  She has changed pharmacies.  Asked that we disregard the request.

## 2014-10-27 ENCOUNTER — Encounter: Payer: Self-pay | Admitting: Neurology

## 2014-10-27 ENCOUNTER — Ambulatory Visit (INDEPENDENT_AMBULATORY_CARE_PROVIDER_SITE_OTHER): Payer: Medicare HMO | Admitting: Neurology

## 2014-10-27 VITALS — BP 142/71 | HR 64 | Ht 62.0 in | Wt 176.0 lb

## 2014-10-27 DIAGNOSIS — R413 Other amnesia: Secondary | ICD-10-CM

## 2014-10-27 NOTE — Progress Notes (Signed)
Reason for visit: Memory disorder  Kari Bullock is an 74 y.o. female  History of present illness:  Kari Bullock is a 74 year old right-handed white female with a history of a mild memory disorder. The patient has done fairly well over the last 6 months, she denies any significant changes in her memory. She has lost some weight, and she is sleeping better at night, she feels as if her memory and cognitive capacity has improved. The patient is on Aricept, she is tolerating medication well. She has not had any significant medical issues that have come up since last seen. The patient returns to this office for an evaluation. She no longer requires Benadryl at night for sleep.  Past Medical History  Diagnosis Date  . Hypertension   . Breast cancer     2001 RT breat lumpectomy rad tx  . GERD (gastroesophageal reflux disease)   . Memory loss   . Pure hypercholesterolemia   . Memory change 10/24/2013    Past Surgical History  Procedure Laterality Date  . Abdominal hysterectomy    . Bilateral oophorectomy  2007  . Incision and drainage of wound  2011    buttocks  . Tonsillectomy    . Cataract extraction Bilateral   . Carpal tunnel release Right     Family History  Problem Relation Age of Onset  . Stroke Mother   . Cancer Maternal Aunt   . Cancer Maternal Grandmother   . Heart attack Father   . Cancer Brother     pancreatic    Social history:  reports that she quit smoking about 14 years ago. Her smoking use included Cigarettes. She has a 20 pack-year smoking history. She has never used smokeless tobacco. She reports that she drinks alcohol. She reports that she does not use illicit drugs.    Allergies  Allergen Reactions  . Codeine Nausea And Vomiting  . Morphine And Related Itching  . Sulfur Rash  . Tamoxifen Other (See Comments)    Blurred vision and dizziness    Medications:  Current Outpatient Prescriptions on File Prior to Visit  Medication Sig Dispense Refill    . aspirin 81 MG tablet Take 81 mg by mouth daily.    . calcium carbonate (OS-CAL) 600 MG TABS Take 600 mg by mouth daily.     . Cholecalciferol (VITAMIN D-3 PO) Take 1,000 Units by mouth daily.     Marland Kitchen donepezil (ARICEPT) 10 MG tablet Take 1 tablet (10 mg total) by mouth at bedtime. 90 tablet 1  . losartan-hydrochlorothiazide (HYZAAR) 50-12.5 MG per tablet Take 1 tablet by mouth daily.     Marland Kitchen omeprazole (PRILOSEC) 20 MG capsule Take 20 mg by mouth daily.     . pravastatin (PRAVACHOL) 10 MG tablet Take 10 mg by mouth daily.      No current facility-administered medications on file prior to visit.    ROS:  Out of a complete 14 system review of symptoms, the patient complains only of the following symptoms, and all other reviewed systems are negative.  Muscle cramps Memory problems  Blood pressure 142/71, pulse 64, height 5\' 2"  (1.575 m), weight 176 lb (79.833 kg).  Physical Exam  General: The patient is alert and cooperative at the time of the examination. The patient is moderately obese.  Skin: No significant peripheral edema is noted.   Neurologic Exam  Mental status: The patient is oriented x 3. Mini-Mental Status Examination done today shows a total score of 27/30.  Cranial nerves: Facial symmetry is present. Speech is normal, no aphasia or dysarthria is noted. Extraocular movements are full. Visual fields are full.  Motor: The patient has good strength in all 4 extremities.  Sensory examination: Soft touch sensation is symmetric on the face, arms, and legs.  Coordination: The patient has good finger-nose-finger and heel-to-shin bilaterally.  Gait and station: The patient has a normal gait. Tandem gait is normal. Romberg is negative. No drift is seen.  Reflexes: Deep tendon reflexes are symmetric.   Assessment/Plan:  1. Memory disorder  The patient is doing relatively well at this point. She has had some mild cognitive changes on Mini-Mental status examination over  the last year, but she is tolerating the Aricept well. We will continue the medication for now, follow-up in about 8 months. The patient continues to operate a motor vehicle.  Jill Alexanders MD 10/27/2014 8:12 PM  [facility] [address] [place], [place] [address]  Phone [phone] Fax [phone]

## 2014-11-17 ENCOUNTER — Encounter: Payer: Self-pay | Admitting: Internal Medicine

## 2014-12-08 ENCOUNTER — Telehealth: Payer: Self-pay | Admitting: Internal Medicine

## 2014-12-08 NOTE — Telephone Encounter (Signed)
I spoke with the pt. She is aware that we havent seen her since 2011. She said she started having watery diarrhea 2 weeks ago, no recent abx. No blood in her stool.  She went to the pharmacy and they gave her Pedialyte and something similar to mylanta. She said her whole abd feels like its burning and she thinks she may have been slightly dehydrated. She is feeling somewhat better now and said it wasn't anything she couldn't deal with until her ov next week with EG. She just wanted to know what should she eat. I advised a brat diet and plenty of fluids and if she got worse, severe abd pain, fever, vomiting, blood in her stool, etc, she should go to the ED. Pt agreed

## 2014-12-08 NOTE — Telephone Encounter (Signed)
Pt called today and said she has OV with [place] on 3/30 but she is getting worse (burning in her chest) and what do we recommend since she can't get in any sooner. Please advise. [phone]

## 2014-12-08 NOTE — Telephone Encounter (Signed)
Agree with the advice provided

## 2014-12-16 ENCOUNTER — Other Ambulatory Visit: Payer: Self-pay

## 2014-12-16 ENCOUNTER — Ambulatory Visit (INDEPENDENT_AMBULATORY_CARE_PROVIDER_SITE_OTHER): Payer: Medicare HMO | Admitting: Nurse Practitioner

## 2014-12-16 ENCOUNTER — Encounter: Payer: Self-pay | Admitting: Nurse Practitioner

## 2014-12-16 VITALS — BP 149/75 | HR 66 | Temp 97.0°F | Ht 63.0 in | Wt 176.2 lb

## 2014-12-16 DIAGNOSIS — K219 Gastro-esophageal reflux disease without esophagitis: Secondary | ICD-10-CM | POA: Diagnosis not present

## 2014-12-16 DIAGNOSIS — R1033 Periumbilical pain: Secondary | ICD-10-CM | POA: Insufficient documentation

## 2014-12-16 DIAGNOSIS — R197 Diarrhea, unspecified: Secondary | ICD-10-CM

## 2014-12-16 DIAGNOSIS — Z86010 Personal history of colonic polyps: Secondary | ICD-10-CM

## 2014-12-16 MED ORDER — PEG-KCL-NACL-NASULF-NA ASC-C 100 G PO SOLR: 1.0000 | ORAL | Status: DC

## 2014-12-16 NOTE — Progress Notes (Signed)
cc'ed to pcp °

## 2014-12-16 NOTE — Assessment & Plan Note (Signed)
74 year old female presents with complaint of periumbilical pain, watery diarrhea for about the past 3 weeks. Her diarrhea is beginning to improve however the periumbilical pain remains and ranges on a scale from 3-10 out of 10 of note she has significant family history of a brother who died of pancreatic cancer. She also has a history of acid reflux and is on Prilosec 20 mg a day, however after changing insurance companies she is having difficulty getting this medication covered and is since run out. Potential etiologies of her symptoms include pancreatitis, worsening acid reflux without her PPI therapy, gastroenteritis, and C. difficile. However cannot rule out malignancy. Of note she is also due for a routine surveillance colonoscopy at this time. Today I'll provide her samples of Prilosec 20 mg last 2 weeks until the insurance situation straightened out. I'll also order labs including CBC, CMP, lipase, and stool studies. I will also order a CT of the abdomen and pelvis with contrast to evaluate her abdomen further given the persistence and longevity of her symptoms. We'll also arrange for her due surveillance colonoscopy with possible add-on EGD depending on her labs and imaging results as well as any symptomatically improvement with reinitiating her Prilosec.  Proceed with TCS +/- EGD with Dr. Gala Romney in near future: the risks, benefits, and alternatives have been discussed with the patient in detail. The patient states understanding and desires to proceed.

## 2014-12-16 NOTE — Progress Notes (Signed)
Primary Care Physician:  [name], MD Primary Gastroenterologist:  Dr. Gala Romney  Chief Complaint  Patient presents with  . Follow-up  . change in bowel habits  . burning in stomach    HPI:   74 year old female presents for follow-up. Of note she is due for a routine screening colonoscopy as her last colonoscopy was dated 12/01/2009. At that time she was noted she has a distant history of colonic adenoma in 2000 with negative colonoscopy in 2005 that demonstrated only diverticulosis she is asymptomatic in 2011 for her screening colonoscopy. Results of procedure was found to have noted sigmoid diverticulosis, single diminutive polyp in the mid sigmoid, remainder of the colonic mucosa appeared normal, and anal papilla. Pathology of the single polyp that was removed was demonstrated to be hyperplastic and recommended 5 year follow-up for surveillance. Per telephone note records on 12/08/2014 the patient was having abdominal pain, watery diarrhea 2 weeks prior, no recent antibiotics, no blood in her stool. She is yourself at home on pharmacist advice with Pedialyte and Mylanta. When she called in her office she was advised to eat a brat diet and drink plenty of fluids and she got worse including severe abdominal pain, fever, vomiting, blood in her stool, etc. she should proceed with the EGD. Patient stated that she had started feeling better.  Today she states she is still having some abdominal burning in her lower abdomen. States her brother died of pancreatic cancer. Has also been having watery diarrhea, but this is improving as it is starting to become more formed but still has 2-3 stools a day. A week ago she was having multiple watery stools a day. States she's lost about 10 pounds unintentionally over 2-3 months. Her pain ranges from 2 to 10 and si described as stinging and burning. The Mylanta helped somewhat. Denies N/V, hematemesis, melena, dysphagia. Admits some dyspepsia. She has run out  of her Prilosec but after changing insurance she is having difficulty getting it covered. Denies fever, chills, change in appetite, syncope, near syncope. Admits some lightheadedness/dizziness. Denies any other upper or lower GI symptoms.  Past Medical History  Diagnosis Date  . Hypertension   . Breast cancer     2001 RT breat lumpectomy rad tx  . GERD (gastroesophageal reflux disease)   . Memory loss   . Pure hypercholesterolemia   . Memory change 10/24/2013    Past Surgical History  Procedure Laterality Date  . Abdominal hysterectomy    . Bilateral oophorectomy  2007  . Incision and drainage of wound  2011    buttocks  . Tonsillectomy    . Cataract extraction Bilateral   . Carpal tunnel release Right   . Colonoscopy  2011    RMR: 1. Anal papilla, otherwise normal rectum. 2. Left-sided diverticula, single diminutive polyp in the sigmoid segment status post cold biospy removal. Remainder of the colonic mucosa appeared normal.     Current Outpatient Prescriptions  Medication Sig Dispense Refill  . aspirin 81 MG tablet Take 81 mg by mouth daily.    . calcium carbonate (OS-CAL) 600 MG TABS Take 600 mg by mouth daily.     . Cholecalciferol (VITAMIN D-3 PO) Take 1,000 Units by mouth daily.     Marland Kitchen donepezil (ARICEPT) 10 MG tablet Take 1 tablet (10 mg total) by mouth at bedtime. 90 tablet 1  . losartan-hydrochlorothiazide (HYZAAR) 50-12.5 MG per tablet Take 1 tablet by mouth daily.     Marland Kitchen omeprazole (PRILOSEC) 20 MG  capsule Take 20 mg by mouth daily.     . pravastatin (PRAVACHOL) 10 MG tablet Take 10 mg by mouth daily.      No current facility-administered medications for this visit.    Allergies as of 12/16/2014 - Review Complete 12/16/2014  Allergen Reaction Noted  . Codeine Nausea And Vomiting 11/20/2011  . Morphine and related Itching 11/20/2011  . Sulfur Rash 11/20/2011  . Tamoxifen Other (See Comments) 11/20/2011    Family History  Problem Relation Age of Onset  . Stroke  Mother   . Cancer Maternal Aunt   . Cancer Maternal Grandmother   . Heart attack Father   . Cancer Brother     pancreatic    History   Social History  . Marital Status: Married    Spouse Name: N/A  . Number of Children: 3  . Years of Education: 12   Occupational History  . Retired    Social History Main Topics  . Smoking status: Former Smoker -- 1.00 packs/day for 20 years    Types: Cigarettes    Quit date: 11/20/1999  . Smokeless tobacco: Never Used  . Alcohol Use: Yes     Comment: occasional wine  . Drug Use: No  . Sexual Activity: Not on file   Other Topics Concern  . Not on file   Social History Narrative    Review of Systems: General: Positive for weight loss. Denies fever, chills, fatigue, weakness. Admits dizziness Eyes: Negative for vision changes.  ENT: Negative for difficulty swallowing. CV: Negative for chest pain, angina, palpitations, dyspnea on exertion, peripheral edema.  Respiratory: Negative for dyspnea at rest, sputum, wheezing.  GI: See history of present illness. MS: Negative for joint pain, low back pain.  Derm: Negative for rash or itching.  Neuro: Negative for weakness, seizure, frequent headaches, memory loss, confusion.  Psych: Negative for anxiety, depression.  Endo: Negative for unusual weight change.  Heme: Negative for bruising or bleeding. Allergy: Negative for rash or hives.   Physical Exam: BP 149/75 mmHg  Pulse 66  Temp(Src) 97 F (36.1 C)  Ht 5\' 3"  (1.6 m)  Wt 176 lb 3.2 oz (79.924 kg)  BMI 31.22 kg/m2 General:   Alert and oriented. Pleasant and cooperative. Well-nourished and well-developed.  Head:  Normocephalic and atraumatic. Eyes:  Without icterus, sclera clear and conjunctiva pink.  Ears:  Normal auditory acuity. Nose:  No deformity, discharge,  or lesions. Mouth:  No deformity or lesions, oral mucosa pink. No OP edema. Neck:  Supple, without mass or thyromegaly. Lungs:  Clear to auscultation bilaterally. No  wheezes, rales, or rhonchi. No distress.  Heart:  S1, S2 present without murmurs appreciated.  Abdomen:  +BS, soft,and non-distended. Some increased periumbilical TTP. No HSM noted. No guarding or rebound. No masses appreciated.  Rectal:  Deferred  Msk:  Symmetrical without gross deformities. Normal posture. Pulses:  Normal DP pulses noted. Extremities:  Without clubbing or edema. Neurologic:  Alert and  oriented x4;  grossly normal neurologically. Skin:  Intact without significant lesions or rashes. Psych:  Alert and cooperative. Normal mood and affect.     12/16/2014 9:55 AM

## 2014-12-16 NOTE — Patient Instructions (Signed)
1. I have ordered lab work and imaging studies. These have these done in the next day or 2. 2. We will schedule your colonoscopy for you with the possibility of adding an upper endoscopy at that time. 3. I'm providing samples of your Prilosec 20 mg once a day to last her for the next couple weeks while you correctly insurance problem with her medication 4. Further recommendations to be based on the results of your labs, imaging, stool studies, and procedures.

## 2014-12-17 LAB — COMPREHENSIVE METABOLIC PANEL
ALT: 15 U/L (ref 0–35)
AST: 19 U/L (ref 0–37)
Albumin: 4 g/dL (ref 3.5–5.2)
Alkaline Phosphatase: 65 U/L (ref 39–117)
BILIRUBIN TOTAL: 0.5 mg/dL (ref 0.2–1.2)
BUN: 14 mg/dL (ref 6–23)
CO2: 28 meq/L (ref 19–32)
CREATININE: 0.69 mg/dL (ref 0.50–1.10)
Calcium: 10.6 mg/dL — ABNORMAL HIGH (ref 8.4–10.5)
Chloride: 100 mEq/L (ref 96–112)
Glucose, Bld: 112 mg/dL — ABNORMAL HIGH (ref 70–99)
Potassium: 4 mEq/L (ref 3.5–5.3)
Sodium: 140 mEq/L (ref 135–145)
Total Protein: 6.5 g/dL (ref 6.0–8.3)

## 2014-12-17 LAB — LIPASE: LIPASE: 22 U/L (ref 0–75)

## 2014-12-18 ENCOUNTER — Ambulatory Visit (HOSPITAL_COMMUNITY)
Admission: RE | Admit: 2014-12-18 | Discharge: 2014-12-18 | Disposition: A | Discharge status: Home / Self Care | Payer: Medicare HMO | Source: Ambulatory Visit | Attending: Nurse Practitioner | Admitting: Nurse Practitioner

## 2014-12-18 ENCOUNTER — Encounter (HOSPITAL_COMMUNITY): Payer: Self-pay

## 2014-12-18 DIAGNOSIS — R197 Diarrhea, unspecified: Secondary | ICD-10-CM | POA: Diagnosis not present

## 2014-12-18 DIAGNOSIS — R918 Other nonspecific abnormal finding of lung field: Secondary | ICD-10-CM | POA: Diagnosis not present

## 2014-12-18 DIAGNOSIS — R1033 Periumbilical pain: Secondary | ICD-10-CM

## 2014-12-18 LAB — OVA AND PARASITE EXAMINATION: OP: NONE SEEN

## 2014-12-18 LAB — CLOSTRIDIUM DIFFICILE BY PCR: Toxigenic C. Difficile by PCR: NOT DETECTED

## 2014-12-18 MED ORDER — IOHEXOL 300 MG/ML  SOLN
100.0000 mL | Freq: Once | INTRAMUSCULAR | Status: AC | PRN
  Administered 2014-12-18: 100 mL via INTRAVENOUS

## 2014-12-21 ENCOUNTER — Other Ambulatory Visit: Payer: Self-pay

## 2014-12-21 DIAGNOSIS — R911 Solitary pulmonary nodule: Secondary | ICD-10-CM

## 2014-12-21 LAB — STOOL CULTURE

## 2014-12-21 MED ORDER — PEG 3350-KCL-NA BICARB-NACL 420 G PO SOLR: 4000.0000 mL | ORAL | Status: DC

## 2014-12-22 ENCOUNTER — Telehealth: Payer: Self-pay

## 2014-12-22 MED ORDER — SUCRALFATE 1 GM/10ML PO SUSP
1.0000 g | Freq: Three times a day (TID) | ORAL | Status: DC | PRN
Start: 2014-12-22 — End: 2015-01-13

## 2014-12-22 NOTE — Telephone Encounter (Signed)
Pt is wanting to know what she can do because her stomach is burning really bad. Please advise

## 2014-12-22 NOTE — Telephone Encounter (Signed)
Her abdominal CT was normal. This may be exacerbation of acid reflux. Ask her if she's been taking the Prilosec. Also, I am sending in Carafate that she can take for breakthrough symptoms (1 gm 3 times a day as needed for epigastric pain).

## 2014-12-22 NOTE — Telephone Encounter (Signed)
Pt is aware. The suspension is $200 because her insurance only covers the pill form. Per LSL ok to change to pill. Called pharmacy and left a message to change rx to pill form and asked them to call back if they had any questions. Advised pt that she would have to crush the pills and add to applesauce. She said she didn't mind doing that.

## 2014-12-22 NOTE — Telephone Encounter (Signed)
LMOM

## 2014-12-25 ENCOUNTER — Other Ambulatory Visit: Payer: Self-pay

## 2014-12-27 ENCOUNTER — Other Ambulatory Visit: Payer: Self-pay | Admitting: Neurology

## 2014-12-31 ENCOUNTER — Ambulatory Visit (HOSPITAL_COMMUNITY)
Admission: RE | Admit: 2014-12-31 | Discharge: 2014-12-31 | Disposition: A | Discharge status: Home / Self Care | Payer: Medicare HMO | Source: Ambulatory Visit | Attending: Nurse Practitioner | Admitting: Nurse Practitioner

## 2014-12-31 DIAGNOSIS — J323 Chronic sphenoidal sinusitis: Secondary | ICD-10-CM | POA: Insufficient documentation

## 2014-12-31 DIAGNOSIS — R918 Other nonspecific abnormal finding of lung field: Secondary | ICD-10-CM | POA: Insufficient documentation

## 2014-12-31 DIAGNOSIS — K5730 Diverticulosis of large intestine without perforation or abscess without bleeding: Secondary | ICD-10-CM | POA: Insufficient documentation

## 2014-12-31 DIAGNOSIS — M419 Scoliosis, unspecified: Secondary | ICD-10-CM | POA: Insufficient documentation

## 2014-12-31 DIAGNOSIS — Z853 Personal history of malignant neoplasm of breast: Secondary | ICD-10-CM | POA: Diagnosis not present

## 2014-12-31 DIAGNOSIS — I7090 Unspecified atherosclerosis: Secondary | ICD-10-CM | POA: Insufficient documentation

## 2014-12-31 DIAGNOSIS — R911 Solitary pulmonary nodule: Secondary | ICD-10-CM

## 2014-12-31 LAB — GLUCOSE, CAPILLARY: Glucose-Capillary: 101 mg/dL — ABNORMAL HIGH (ref 70–99)

## 2014-12-31 MED ORDER — FLUDEOXYGLUCOSE F - 18 (FDG) INJECTION
8.1400 | Freq: Once | INTRAVENOUS | Status: AC | PRN
  Administered 2014-12-31: 8.14 via INTRAVENOUS

## 2015-01-04 ENCOUNTER — Other Ambulatory Visit (HOSPITAL_COMMUNITY): Payer: Self-pay | Admitting: Oncology

## 2015-01-04 ENCOUNTER — Other Ambulatory Visit (HOSPITAL_COMMUNITY): Payer: Self-pay | Admitting: Hematology & Oncology

## 2015-01-04 DIAGNOSIS — Z1231 Encounter for screening mammogram for malignant neoplasm of breast: Secondary | ICD-10-CM

## 2015-01-07 ENCOUNTER — Telehealth: Payer: Self-pay | Admitting: Nurse Practitioner

## 2015-01-07 ENCOUNTER — Other Ambulatory Visit (HOSPITAL_COMMUNITY): Payer: Self-pay | Admitting: Oncology

## 2015-01-07 NOTE — Telephone Encounter (Signed)
Spoke with the patient about all of her results including the PET scan. She has an oncology follow-up appointment in May. Notified her that I've forwarded the results on the her oncology group and they may be calling her.

## 2015-01-09 ENCOUNTER — Encounter (HOSPITAL_COMMUNITY): Payer: Self-pay | Admitting: Oncology

## 2015-01-09 DIAGNOSIS — C50911 Malignant neoplasm of unspecified site of right female breast: Secondary | ICD-10-CM | POA: Insufficient documentation

## 2015-01-09 DIAGNOSIS — R911 Solitary pulmonary nodule: Secondary | ICD-10-CM

## 2015-01-09 HISTORY — DX: Solitary pulmonary nodule: R91.1

## 2015-01-09 HISTORY — DX: Malignant neoplasm of unspecified site of right female breast: C50.911

## 2015-01-09 NOTE — Assessment & Plan Note (Addendum)
Recently found 7 mm right middle lobe nodule that is hypermetabolic on PET imaging with a hypermetabolic subcarinal lymph node concerning for malignancy found incidentally after a CT abdomen was performed by GI for periumbilical pain, diarrhea, and weight loss x 1 month.  GI just informed us of the results at the end of last week and therefore, she was worked in immediately.  Refer to CTS for further evaluation and consideration of biopsy and/or resection..  Hypercalcemia noted from March 2016.  Updated labs today demonstrate a ULN calcium and is likely from her thiazide, less likely malignancy induced as this has been ongoing chronically.  Return in 2-3 weeks for follow-up.

## 2015-01-09 NOTE — Progress Notes (Signed)
Kari Hampshire, MD [place] [address]  Infiltrating ductal carcinoma of right breast - Plan: CBC with Differential, Comprehensive metabolic panel, Cancer antigen 27.29  Pulmonary nodule - Plan: CBC with Differential, Comprehensive metabolic panel, Cancer antigen 27.29  CURRENT THERAPY: Work-up for newly discovered pulmonary nodule.  INTERVAL HISTORY: Kari Bullock 74 y.o. female is seen sooner than scheduled for a recently found 7 mm right middle lobe nodule that is hypermetabolic on PET imaging with a hypermetabolic subcarinal lymph node concerning for malignancy found incidentally after a CT abdomen was performed by GI for periumbilical pain, diarrhea, and weight loss x 1 month.  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 HER-2/neu was indeterminant at 2+. She was diagnosed in February 2001 treated with surgery followed by radiation therapy and then hormonal therapy. She could not tolerate tamoxifen so was switched to Arimidex which she took for total 5 years ending on 02/15/2005, no evidence of disease.  I personally reviewed and went over laboratory results with the patient.  The results are noted within this dictation.  Hypercalcemia noted and updated labs from today demonstrate an ULN calcium level.  I personally reviewed and went over radiographic studies with the patient.  The results are noted within this dictation.  PET scan report and images are reviewed.   I have discussed the case with IR, Dr. Ardeen Garland, and he thinks that CTS can better serve the patient as I had expected.  The patient's spirit is high and she is prepared for intervention if needed.  I will refer the patient to CTS.  She has an upcoming colonoscopy this week and she is cleared from an oncology standpoint to have  this done.  Oncologically, she denies any complaints other than the following and ROS questioning is negative: weight loss, diarrhea, and decreased appetite.  Past Medical History  Diagnosis Date  . Hypertension   . Breast cancer     2001 RT breat lumpectomy rad tx  . GERD (gastroesophageal reflux disease)   . Memory loss   . Pure hypercholesterolemia   . Memory change 10/24/2013  . 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 Pulmonary nodule 01/09/2015    has Memory change; Periumbilical pain; Diarrhea; GERD (gastroesophageal reflux disease); Infiltrating ductal carcinoma of right breast; and Pulmonary nodule on her problem list.     is allergic to codeine; morphine and related; sulfur; and tamoxifen.  Ms. Plotz does not currently have medications on file.  Past Surgical History  Procedure Laterality Date  . Abdominal hysterectomy    . Bilateral oophorectomy  2007  . Incision and drainage of wound  2011    buttocks  . Tonsillectomy    . Cataract extraction Bilateral   . Carpal tunnel release Right   . Colonoscopy  2011    RMR: 1. Anal papilla, otherwise normal rectum. 2. Left-sided diverticula, single diminutive polyp in the sigmoid segment status post cold biospy removal. Remainder of the colonic mucosa appeared normal.     Denies any headaches, dizziness, double vision, fevers, chills, night sweats, nausea, vomiting, diarrhea, constipation, chest pain, heart palpitations, shortness of breath, blood in stool, black tarry stool, urinary pain, urinary burning, urinary frequency, hematuria.   PHYSICAL EXAMINATION  ECOG PERFORMANCE STATUS: 1 - Symptomatic  but completely ambulatory  Filed Vitals:   01/11/15 1208  BP: 153/70  Pulse: 60  Temp: 98 F (36.7 C)  Resp: 16    GENERAL:alert, no distress, well nourished, well developed, comfortable, cooperative, obese, smiling and accompanied by her daughter. SKIN: skin color, texture, turgor are normal, no rashes  or significant lesions HEAD: Normocephalic, No masses, lesions, tenderness or abnormalities EYES: normal, PERRLA, EOMI, Conjunctiva are pink and non-injected EARS: External ears normal OROPHARYNX:lips, buccal mucosa, and tongue normal and mucous membranes are moist  NECK: supple, no adenopathy, thyroid normal size, non-tender, without nodularity, trachea midline LYMPH:  no palpable lymphadenopathy BREAST:not examined LUNGS: clear to auscultation  HEART: regular rate & rhythm, no murmurs, no gallops, S1 normal and S2 normal ABDOMEN:abdomen soft, non-tender, obese and normal bowel sounds BACK: Back symmetric, no curvature., No CVA tenderness EXTREMITIES:less then 2 second capillary refill, no joint deformities, effusion, or inflammation, no skin discoloration, no clubbing, no cyanosis  NEURO: alert & oriented x 3 with fluent speech, no focal motor/sensory deficits, gait normal   LABORATORY DATA: CBC    Component Value Date/Time   WBC 7.0 01/11/2015 1145   RBC 5.00 01/11/2015 1145   HGB 14.2 01/11/2015 1145   HCT 43.8 01/11/2015 1145   PLT 206 01/11/2015 1145   MCV 87.6 01/11/2015 1145   MCH 28.4 01/11/2015 1145   MCHC 32.4 01/11/2015 1145   RDW 13.4 01/11/2015 1145   LYMPHSABS 1.9 01/11/2015 1145   MONOABS 0.7 01/11/2015 1145   EOSABS 0.2 01/11/2015 1145   BASOSABS 0.1 01/11/2015 1145      Chemistry      Component Value Date/Time   NA 138 01/11/2015 1145   K 3.7 01/11/2015 1145   CL 102 01/11/2015 1145   CO2 28 01/11/2015 1145   BUN 21 01/11/2015 1145   CREATININE 0.80 01/11/2015 1145   CREATININE 0.69 12/16/2014 1058      Component Value Date/Time   CALCIUM 10.5 01/11/2015 1145   ALKPHOS 61 01/11/2015 1145   AST 25 01/11/2015 1145   ALT 21 01/11/2015 1145   BILITOT 0.6 01/11/2015 1145        RADIOGRAPHIC STUDIES:  Ct Abdomen Pelvis W Contrast  12/18/2014   CLINICAL DATA:  Initial encounter for periumbilical pain with diarrhea and weight loss for 1 month.   EXAM: CT ABDOMEN AND PELVIS WITH CONTRAST  TECHNIQUE: Multidetector CT imaging of the abdomen and pelvis was performed using the standard protocol following bolus administration of intravenous contrast.  CONTRAST:  183m OMNIPAQUE IOHEXOL 300 MG/ML  SOLN  COMPARISON:  12/21/2008.  FINDINGS: Lower chest: 6 mm right middle lobe pulmonary nodule is new in the interval. 17 mm nodule with the left lung base (image 10 series 6) was 7 mm previously.  Hepatobiliary: No focal abnormality within the liver parenchyma. There is no evidence for gallstones, gallbladder wall thickening, or pericholecystic fluid. No intrahepatic or extrahepatic biliary dilation.  Pancreas: No focal mass lesion. No dilatation of the main duct. No intraparenchymal cyst. No peripancreatic edema.  Spleen: No splenomegaly. No focal mass lesion.  Adrenals/Urinary Tract: No adrenal nodule or mass. Kidneys are unremarkable. No evidence for hydroureter bladder has normal CT imaging features.  Stomach/Bowel: Contrast material in the distal esophagus may be related to reflux or dysmotility. Stomach is distended without evidence for wall thickening. Duodenum is normally positioned as is the ligament of Treitz. No small bowel wall thickening. No small bowel dilatation. Terminal ileum is normal. Appendix is normal. Diverticular changes are  noted in the left colon without evidence of diverticulitis.  Vascular/Lymphatic: There is abdominal aortic atherosclerosis without aneurysm. No gastrohepatic or hepatoduodenal ligament lymphadenopathy. No retroperitoneal lymphadenopathy. No pelvic sidewall lymphadenopathy.  Reproductive: Uterus is surgically absent.  No adnexal mass.  Other: No intraperitoneal free fluid.  Musculoskeletal: Bone windows reveal no worrisome lytic or sclerotic osseous lesions.  IMPRESSION: No acute findings to explain the patient's history of periumbilical pain and diarrhea.  New 6 mm right middle lobe pulmonary nodule with a 17 mm nodule  identified in the left lung base, increased in size in the interval. Although growth of the left basilar nodule has been slow, neoplasm cannot be excluded. PET-CT recommended to further evaluate.   Electronically Signed   By: Misty Stanley M.D.   On: 12/18/2014 15:00   Nm Pet Image Initial (pi) Skull Base To Thigh  12/31/2014   CLINICAL DATA:  Initial treatment strategy for lung nodules. History of right breast cancer.  EXAM: NUCLEAR MEDICINE PET SKULL BASE TO THIGH  TECHNIQUE: 8.1 mCi F-18 FDG was injected intravenously. Full-ring PET imaging was performed from the skull base to thigh after the radiotracer. CT data was obtained and used for attenuation correction and anatomic localization.  FASTING BLOOD GLUCOSE:  Value: 101 mg/dl  COMPARISON:  12/18/2014  FINDINGS: NECK  No hypermetabolic lymph nodes in the neck. Chronic left sphenoid sinusitis.  CHEST  0.7 by 0.6 cm right middle lobe nodule, image 51 of series 6, maximum standard uptake value 4.3.  Along the inferomedial margin of the right mainstem bronchus posteriorly, and presumably is corresponding to a smaller subcarinal lymph node, a focus of high metabolic activity has maximum standard uptake value of 17.2. This is poorly correlated on the CT data. New  The nodularity along the epicardial fat pad is not hypermetabolic and is presumed to represent atelectasis or scarring. Skin thickening along the right anterior breast noted. Coronary artery atherosclerosis. A 7 mm lingular nodule on image 42 of series 6 does not have appreciable hypermetabolic activity.  ABDOMEN/PELVIS  No abnormal hypermetabolic activity within the liver, pancreas, adrenal glands, or spleen. No hypermetabolic lymph nodes in the abdomen or pelvis. Physiologic activity in the bowel. Aortoiliac atherosclerotic vascular disease. Sigmoid colon diverticulosis. Uterus absent.  SKELETON  No focal hypermetabolic activity to suggest skeletal metastasis. Dextroconvex lumbar scoliosis.   IMPRESSION: 1. The 7 mm right middle lobe nodule is hypermetabolic, and there is considerable focal hypermetabolic activity along the posterior inferior margin of the right mainstem bronchus, presumably in the nonenlarged subcarinal lymph node in this vicinity. Appearance compatible with malignancy. 2. Skin thickening of the right anterior breast, possibly postoperative but significance uncertain. 3. There is a 7 mm lingular nodule which is not hypermetabolic but which is potentially below sensitive size thresholds. A larger a anterior left lower lobe density adjacent to the epicardial fat pad measures up to 1.6 cm in long axis but is not hypermetabolic and probably represents atelectasis or scarring. 4. Other findings include chronic left sphenoid sinusitis ; atherosclerosis; sigmoid diverticulosis; and dextroconvex lumbar scoliosis.   Electronically Signed   By: Van Clines M.D.   On: 12/31/2014 09:56     ASSESSMENT AND PLAN:  Pulmonary nodule Recently found 7 mm right middle lobe nodule that is hypermetabolic on PET imaging with a hypermetabolic subcarinal lymph node concerning for malignancy found incidentally after a CT abdomen was performed by GI for periumbilical pain, diarrhea, and weight loss x 1 month.  GI just informed us of the  results at the end of last week and therefore, she was worked in immediately.  Refer to CTS for further evaluation and consideration of biopsy and/or resection..  Hypercalcemia noted from March 2016.  Updated labs today demonstrate a ULN calcium and is likely from her thiazide, less likely malignancy induced as this has been ongoing chronically.  Return in 2-3 weeks for follow-up.     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 HER-2/neu was indeterminant at 2+. She was  diagnosed in February 2001 treated with surgery followed by radiation therapy and then hormonal therapy. She could not tolerate tamoxifen so was switched to Arimidex which she took for total 5 years ending on 02/15/2005, no evidence of disease.    THERAPY PLAN:  Patient needs work-up for hypermetabolic pulmonary nodule with active subcarinal node.  Will refer to appropriate specialty.  All questions were answered. The patient knows to call the clinic with any problems, questions or concerns. We can certainly see the patient much sooner if necessary.  Patient and plan discussed with Dr. Ancil Linsey and she is in agreement with the aforementioned.   This note is electronically signed by: Robynn Pane 01/11/2015 1:03 PM

## 2015-01-09 NOTE — Assessment & Plan Note (Signed)
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 HER-2/neu was indeterminant at 2+. She was diagnosed in February 2001 treated with surgery followed by radiation therapy and then hormonal therapy. She could not tolerate tamoxifen so was switched to Arimidex which she took for total 5 years ending on 02/15/2005, no evidence of disease.

## 2015-01-11 ENCOUNTER — Encounter (HOSPITAL_COMMUNITY): Payer: Medicare HMO | Attending: Oncology | Admitting: Oncology

## 2015-01-11 ENCOUNTER — Encounter (HOSPITAL_COMMUNITY): Payer: Medicare HMO

## 2015-01-11 ENCOUNTER — Encounter (HOSPITAL_COMMUNITY): Payer: Self-pay | Admitting: Oncology

## 2015-01-11 VITALS — BP 153/70 | HR 60 | Temp 98.0°F | Resp 16 | Wt 172.6 lb

## 2015-01-11 DIAGNOSIS — Z853 Personal history of malignant neoplasm of breast: Secondary | ICD-10-CM

## 2015-01-11 DIAGNOSIS — C50911 Malignant neoplasm of unspecified site of right female breast: Secondary | ICD-10-CM

## 2015-01-11 DIAGNOSIS — R911 Solitary pulmonary nodule: Secondary | ICD-10-CM | POA: Insufficient documentation

## 2015-01-11 LAB — CBC WITH DIFFERENTIAL/PLATELET
Basophils Absolute: 0.1 10*3/uL (ref 0.0–0.1)
Basophils Relative: 1 % (ref 0–1)
EOS ABS: 0.2 10*3/uL (ref 0.0–0.7)
EOS PCT: 3 % (ref 0–5)
HEMATOCRIT: 43.8 % (ref 36.0–46.0)
Hemoglobin: 14.2 g/dL (ref 12.0–15.0)
LYMPHS PCT: 28 % (ref 12–46)
Lymphs Abs: 1.9 10*3/uL (ref 0.7–4.0)
MCH: 28.4 pg (ref 26.0–34.0)
MCHC: 32.4 g/dL (ref 30.0–36.0)
MCV: 87.6 fL (ref 78.0–100.0)
MONOS PCT: 10 % (ref 3–12)
Monocytes Absolute: 0.7 10*3/uL (ref 0.1–1.0)
Neutro Abs: 4.1 10*3/uL (ref 1.7–7.7)
Neutrophils Relative %: 58 % (ref 43–77)
PLATELETS: 206 10*3/uL (ref 150–400)
RBC: 5 MIL/uL (ref 3.87–5.11)
RDW: 13.4 % (ref 11.5–15.5)
WBC: 7 10*3/uL (ref 4.0–10.5)

## 2015-01-11 LAB — COMPREHENSIVE METABOLIC PANEL
ALK PHOS: 61 U/L (ref 39–117)
ALT: 21 U/L (ref 0–35)
AST: 25 U/L (ref 0–37)
Albumin: 4.4 g/dL (ref 3.5–5.2)
Anion gap: 8 (ref 5–15)
BUN: 21 mg/dL (ref 6–23)
CALCIUM: 10.5 mg/dL (ref 8.4–10.5)
CO2: 28 mmol/L (ref 19–32)
Chloride: 102 mmol/L (ref 96–112)
Creatinine, Ser: 0.8 mg/dL (ref 0.50–1.10)
GFR calc Af Amer: 82 mL/min — ABNORMAL LOW (ref >90)
GFR, EST NON AFRICAN AMERICAN: 70 mL/min — AB (ref >90)
GLUCOSE: 83 mg/dL (ref 70–99)
Potassium: 3.7 mmol/L (ref 3.5–5.1)
SODIUM: 138 mmol/L (ref 135–145)
TOTAL PROTEIN: 7.2 g/dL (ref 6.0–8.3)
Total Bilirubin: 0.6 mg/dL (ref 0.3–1.2)

## 2015-01-11 NOTE — Patient Instructions (Signed)
[place] at [facility] Discharge Instructions  RECOMMENDATIONS MADE BY THE CONSULTANT AND ANY TEST RESULTS WILL BE SENT TO YOUR REFERRING PHYSICIAN.  Referral to cardiothoracic surgeon for biopsy of lung nodule  Tentative appointment with this clinic in 2 weeks.  Thank you for choosing [place] at [facility] to provide your oncology and hematology care.  To afford each patient quality time with our provider, please arrive at least 15 minutes before your scheduled appointment time.    You need to re-schedule your appointment should you arrive 10 or more minutes late.  We strive to give you quality time with our providers, and arriving late affects you and other patients whose appointments are after yours.  Also, if you no show three or more times for appointments you may be dismissed from the clinic at the providers discretion.     Again, thank you for choosing [facility]  Our hope is that these requests will decrease the amount of time that you wait before being seen by our physicians.       _____________________________________________________________  Should you have questions after your visit to [facility], please contact our office at [phone] between the hours of 8:30 a.m. and 4:30 p.m.  Voicemails left after 4:30 p.m. will not be returned until the following business day.  For prescription refill requests, have your pharmacy contact our office.

## 2015-01-12 ENCOUNTER — Other Ambulatory Visit (HOSPITAL_COMMUNITY): Payer: Medicare Other

## 2015-01-12 ENCOUNTER — Ambulatory Visit (HOSPITAL_COMMUNITY): Payer: Medicare HMO | Admitting: Hematology & Oncology

## 2015-01-12 LAB — CANCER ANTIGEN 27.29: CA 27.29: 28.9 U/mL (ref 0.0–38.6)

## 2015-01-13 ENCOUNTER — Other Ambulatory Visit: Payer: Self-pay | Admitting: *Deleted

## 2015-01-13 ENCOUNTER — Institutional Professional Consult (permissible substitution) (INDEPENDENT_AMBULATORY_CARE_PROVIDER_SITE_OTHER): Payer: Medicare HMO | Admitting: Cardiothoracic Surgery

## 2015-01-13 ENCOUNTER — Encounter: Payer: Self-pay | Admitting: Cardiothoracic Surgery

## 2015-01-13 VITALS — BP 155/77 | HR 60 | Resp 20 | Ht 63.0 in | Wt 165.0 lb

## 2015-01-13 DIAGNOSIS — R590 Localized enlarged lymph nodes: Secondary | ICD-10-CM

## 2015-01-13 DIAGNOSIS — R911 Solitary pulmonary nodule: Secondary | ICD-10-CM | POA: Diagnosis not present

## 2015-01-13 DIAGNOSIS — C50911 Malignant neoplasm of unspecified site of right female breast: Secondary | ICD-10-CM

## 2015-01-13 NOTE — Progress Notes (Signed)
PCP is [name], MD Referring Provider is Sheldon Silvan Manon Hilding, PA-C  Chief Complaint  Patient presents with  . Lung Lesion    Surgical eval, PET Scan 12/31/14, CT ABD/Pelvis 12/18/14    HDQ:QIWLNLG presents for a day which recently diagnosed right middle lobe nodule hypermetabolic on PET scan associated with a hypermetabolic subcarinal lymph node which were incidental findings on CT scan of the abdomen for evaluation of burning epigastric discomfort with diarrhea. 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 She stopped smoking in 2001. She denies any palmar he symptoms.  CT scan of the abdomen was negative however there is a 1.2 mm LLL l node noted as well as a 7 mm right middle lobe nodule and not enlarged mediastinal nodes. A subsequent PET scan showed the right middle lobe nodule to have hypermetabolic activity SUV 4.5, the left  Lower lobe node had no activity in the subcarinal nodal area had hypermetabolic activity with SUV of 17.  No systemic symptoms of weight loss fever or bone pain. PET scan showed no abnormalities of the abdomen. The patient is scheduled undergo colonoscopy tomorrow to evaluate her burning abdominal pain with diarrhea. Past Medical History  Diagnosis Date  . Hypertension   . Breast cancer     2001 RT breat lumpectomy rad tx  . GERD (gastroesophageal reflux disease)   . Memory loss   . Pure hypercholesterolemia   . Memory change 10/24/2013  . 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 Pulmonary nodule 01/09/2015    Past Surgical History  Procedure Laterality Date  . Abdominal hysterectomy    . Bilateral oophorectomy  2007  . Incision and drainage of wound  2011    buttocks  . Tonsillectomy    . Cataract extraction Bilateral   . Carpal tunnel release Right   . Colonoscopy  2011    RMR: 1. Anal papilla, otherwise normal rectum. 2. Left-sided  diverticula, single diminutive polyp in the sigmoid segment status post cold biospy removal. Remainder of the colonic mucosa appeared normal.     Family History  Problem Relation Age of Onset  . Stroke Mother   . Breast cancer Maternal Aunt   . Breast cancer Maternal Grandmother   . Heart attack Father   . Pancreatic cancer Brother     Social History History  Substance Use Topics  . Smoking status: Former Smoker -- 1.00 packs/day for 20 years    Types: Cigarettes    Quit date: 11/20/1999  . Smokeless tobacco: Never Used  . Alcohol Use: Yes     Comment: occasional wine    Current Outpatient Prescriptions  Medication Sig Dispense Refill  . aspirin 81 MG tablet Take 81 mg by mouth daily.    . Cholecalciferol (VITAMIN D-3 PO) Take 1,000 Units by mouth daily.     Marland Kitchen donepezil (ARICEPT) 10 MG tablet TAKE 1 TABLET BY MOUTH EVERY NIGHT AT BEDTIME 90 tablet 1  . losartan-hydrochlorothiazide (HYZAAR) 50-12.5 MG per tablet Take 1 tablet by mouth daily.     Marland Kitchen omeprazole (PRILOSEC) 20 MG capsule Take 20 mg by mouth daily.     . polyethylene glycol-electrolytes (TRILYTE) 420 G solution Take 4,000 mLs by mouth as directed. 4000 mL 0  . sucralfate (CARAFATE) 1 G tablet Take 1 g by mouth 3 (three) times daily.     No current facility-administered medications for this visit.  Allergies  Allergen Reactions  . Codeine Nausea And Vomiting  . Morphine And Related Itching  . Sulfur Rash  . Tamoxifen Other (See Comments)    Blurred vision and dizziness    Review of Systems  Gen.-mild weight loss associated with diarrhea, denies fever or night sweats HEENT-no change in vision no active dental complaints no difficulty swallowing Thorax-no palmar he symptoms of cough hemoptysis shortness of breath Cardiac-no history of arrhythmia angina or murmur Abdomen-significant problems with epigastric burning pain and diarrhea, colonoscopy and probable upper endoscopy pending Endocrine-negative for  diabetes Vascular-no TIA claudication or DVT symptoms Neuro-no stroke or seizure  BP 155/77 mmHg  Pulse 60  Resp 20  Ht '5\' 3"'$  (1.6 m)  Wt 165 lb (74.844 kg)  BMI 29.24 kg/m2  SpO2 96% Physical Exam  General: obese 74 year old female alert appropriate and appears active HEENT: Normocephalic pupils equal , dentition adequate Neck: Supple without JVD, adenopathy, or bruit Chest: Clear to auscultation, symmetrical breath sounds, no rhonchi, no tenderness             or deformity Cardiovascular: Regular rate and rhythm, no murmur, no gallop, peripheral pulses             palpable in all extremities Abdomen:  Soft, nontender, no palpable mass or organomegaly Extremities: Warm, well-perfused, no clubbing cyanosis edema or tenderness,              no venous stasis changes of the legs Rectal/GU: Deferred Neuro: Grossly non--focal and symmetrical throughout Skin: Clean and dry without rash or ulceration   Diagnostic Tests: CT of chest, PET scan, personally reviewed She has bilateral pulmonary nodules, right middlelobe nodule hypermetabolic activity positive on PET scan with subcarinal mediastinal adenopathy positive on PET scan SUV 14  Impression:  Doubt recurrent breast cancer  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 If positive she will be referred for oncology evaluation and therapy. If negative we will discuss pulmonary section of the right middle lobe nodule with mediastinal node dissection. She would need PFTs and brain MRI prior to surgery.   Len Childs, MD [facility] [phone]

## 2015-01-14 ENCOUNTER — Ambulatory Visit (HOSPITAL_COMMUNITY)
Admission: RE | Admit: 2015-01-14 | Discharge: 2015-01-14 | Disposition: A | Discharge status: Home / Self Care | Payer: Medicare HMO | Source: Ambulatory Visit | Attending: Internal Medicine | Admitting: Internal Medicine

## 2015-01-14 ENCOUNTER — Encounter (HOSPITAL_COMMUNITY)
Admission: RE | Disposition: A | Discharge status: Home / Self Care | Payer: Self-pay | Source: Ambulatory Visit | Attending: Internal Medicine

## 2015-01-14 ENCOUNTER — Encounter (HOSPITAL_COMMUNITY): Payer: Self-pay | Admitting: *Deleted

## 2015-01-14 DIAGNOSIS — Z79899 Other long term (current) drug therapy: Secondary | ICD-10-CM | POA: Diagnosis not present

## 2015-01-14 DIAGNOSIS — I10 Essential (primary) hypertension: Secondary | ICD-10-CM | POA: Diagnosis not present

## 2015-01-14 DIAGNOSIS — Z86010 Personal history of colonic polyps: Secondary | ICD-10-CM | POA: Diagnosis not present

## 2015-01-14 DIAGNOSIS — Z7982 Long term (current) use of aspirin: Secondary | ICD-10-CM | POA: Insufficient documentation

## 2015-01-14 DIAGNOSIS — R413 Other amnesia: Secondary | ICD-10-CM | POA: Diagnosis not present

## 2015-01-14 DIAGNOSIS — E780 Pure hypercholesterolemia: Secondary | ICD-10-CM | POA: Diagnosis not present

## 2015-01-14 DIAGNOSIS — K219 Gastro-esophageal reflux disease without esophagitis: Secondary | ICD-10-CM | POA: Insufficient documentation

## 2015-01-14 DIAGNOSIS — R1033 Periumbilical pain: Secondary | ICD-10-CM

## 2015-01-14 DIAGNOSIS — K529 Noninfective gastroenteritis and colitis, unspecified: Secondary | ICD-10-CM | POA: Insufficient documentation

## 2015-01-14 DIAGNOSIS — R197 Diarrhea, unspecified: Secondary | ICD-10-CM | POA: Diagnosis present

## 2015-01-14 DIAGNOSIS — Z87891 Personal history of nicotine dependence: Secondary | ICD-10-CM | POA: Diagnosis not present

## 2015-01-14 DIAGNOSIS — Z853 Personal history of malignant neoplasm of breast: Secondary | ICD-10-CM | POA: Diagnosis not present

## 2015-01-14 DIAGNOSIS — K5730 Diverticulosis of large intestine without perforation or abscess without bleeding: Secondary | ICD-10-CM | POA: Insufficient documentation

## 2015-01-14 HISTORY — DX: Adverse effect of unspecified anesthetic, initial encounter: T41.45XA

## 2015-01-14 HISTORY — PX: ESOPHAGOGASTRODUODENOSCOPY: SHX5428

## 2015-01-14 HISTORY — DX: Other complications of anesthesia, initial encounter: T88.59XA

## 2015-01-14 HISTORY — PX: COLONOSCOPY: SHX5424

## 2015-01-14 SURGERY — COLONOSCOPY
Anesthesia: Moderate Sedation

## 2015-01-14 MED ORDER — MEPERIDINE HCL 100 MG/ML IJ SOLN
INTRAMUSCULAR | Status: AC
  Filled 2015-01-14: qty 2

## 2015-01-14 MED ORDER — ONDANSETRON HCL 4 MG/2ML IJ SOLN
INTRAMUSCULAR | Status: AC
  Filled 2015-01-14: qty 2

## 2015-01-14 MED ORDER — LIDOCAINE VISCOUS 2 % MT SOLN
OROMUCOSAL | Status: AC
  Filled 2015-01-14: qty 15

## 2015-01-14 MED ORDER — SODIUM CHLORIDE 0.9 % IV SOLN
INTRAVENOUS | Status: DC
  Administered 2015-01-14: 1000 mL via INTRAVENOUS

## 2015-01-14 MED ORDER — ONDANSETRON HCL 4 MG/2ML IJ SOLN
INTRAMUSCULAR | Status: DC | PRN
  Administered 2015-01-14: 4 mg via INTRAVENOUS

## 2015-01-14 MED ORDER — MIDAZOLAM HCL 5 MG/5ML IJ SOLN
INTRAMUSCULAR | Status: DC | PRN
  Administered 2015-01-14 (×3): 1 mg via INTRAVENOUS
  Administered 2015-01-14: 2 mg via INTRAVENOUS
  Administered 2015-01-14: 1 mg via INTRAVENOUS

## 2015-01-14 MED ORDER — MIDAZOLAM HCL 5 MG/5ML IJ SOLN
INTRAMUSCULAR | Status: AC
  Filled 2015-01-14: qty 10

## 2015-01-14 MED ORDER — STERILE WATER FOR IRRIGATION IR SOLN
Status: DC | PRN
  Administered 2015-01-14: 08:00:00

## 2015-01-14 MED ORDER — MEPERIDINE HCL 100 MG/ML IJ SOLN
INTRAMUSCULAR | Status: DC | PRN
  Administered 2015-01-14: 50 mg via INTRAVENOUS
  Administered 2015-01-14: 25 mg via INTRAVENOUS

## 2015-01-14 NOTE — H&P (View-Only) (Signed)
  Primary Care Physician:  MCINNIS,ANGUS G, MD Primary Gastroenterologist:  Dr. Rourk  Chief Complaint  Patient presents with  . Follow-up  . change in bowel habits  . burning in stomach    HPI:   73-year-old female presents for follow-up. Of note she is due for a routine screening colonoscopy as her last colonoscopy was dated 12/01/2009. At that time she was noted she has a distant history of colonic adenoma in 2000 with negative colonoscopy in 2005 that demonstrated only diverticulosis she is asymptomatic in 2011 for her screening colonoscopy. Results of procedure was found to have noted sigmoid diverticulosis, single diminutive polyp in the mid sigmoid, remainder of the colonic mucosa appeared normal, and anal papilla. Pathology of the single polyp that was removed was demonstrated to be hyperplastic and recommended 5 year follow-up for surveillance. Per telephone note records on 12/08/2014 the patient was having abdominal pain, watery diarrhea 2 weeks prior, no recent antibiotics, no blood in her stool. She is yourself at home on pharmacist advice with Pedialyte and Mylanta. When she called in her office she was advised to eat a brat diet and drink plenty of fluids and she got worse including severe abdominal pain, fever, vomiting, blood in her stool, etc. she should proceed with the EGD. Patient stated that she had started feeling better.  Today she states she is still having some abdominal burning in her lower abdomen. States her brother died of pancreatic cancer. Has also been having watery diarrhea, but this is improving as it is starting to become more formed but still has 2-3 stools a day. A week ago she was having multiple watery stools a day. States she's lost about 10 pounds unintentionally over 2-3 months. Her pain ranges from 2 to 10 and si described as stinging and burning. The Mylanta helped somewhat. Denies N/V, hematemesis, melena, dysphagia. Admits some dyspepsia. She has run out  of her Prilosec but after changing insurance she is having difficulty getting it covered. Denies fever, chills, change in appetite, syncope, near syncope. Admits some lightheadedness/dizziness. Denies any other upper or lower GI symptoms.  Past Medical History  Diagnosis Date  . Hypertension   . Breast cancer     2001 RT breat lumpectomy rad tx  . GERD (gastroesophageal reflux disease)   . Memory loss   . Pure hypercholesterolemia   . Memory change 10/24/2013    Past Surgical History  Procedure Laterality Date  . Abdominal hysterectomy    . Bilateral oophorectomy  2007  . Incision and drainage of wound  2011    buttocks  . Tonsillectomy    . Cataract extraction Bilateral   . Carpal tunnel release Right   . Colonoscopy  2011    RMR: 1. Anal papilla, otherwise normal rectum. 2. Left-sided diverticula, single diminutive polyp in the sigmoid segment status post cold biospy removal. Remainder of the colonic mucosa appeared normal.     Current Outpatient Prescriptions  Medication Sig Dispense Refill  . aspirin 81 MG tablet Take 81 mg by mouth daily.    . calcium carbonate (OS-CAL) 600 MG TABS Take 600 mg by mouth daily.     . Cholecalciferol (VITAMIN D-3 PO) Take 1,000 Units by mouth daily.     . donepezil (ARICEPT) 10 MG tablet Take 1 tablet (10 mg total) by mouth at bedtime. 90 tablet 1  . losartan-hydrochlorothiazide (HYZAAR) 50-12.5 MG per tablet Take 1 tablet by mouth daily.     . omeprazole (PRILOSEC) 20 MG   capsule Take 20 mg by mouth daily.     . pravastatin (PRAVACHOL) 10 MG tablet Take 10 mg by mouth daily.      No current facility-administered medications for this visit.    Allergies as of 12/16/2014 - Review Complete 12/16/2014  Allergen Reaction Noted  . Codeine Nausea And Vomiting 11/20/2011  . Morphine and related Itching 11/20/2011  . Sulfur Rash 11/20/2011  . Tamoxifen Other (See Comments) 11/20/2011    Family History  Problem Relation Age of Onset  . Stroke  Mother   . Cancer Maternal Aunt   . Cancer Maternal Grandmother   . Heart attack Father   . Cancer Brother     pancreatic    History   Social History  . Marital Status: Married    Spouse Name: N/A  . Number of Children: 3  . Years of Education: 12   Occupational History  . Retired    Social History Main Topics  . Smoking status: Former Smoker -- 1.00 packs/day for 20 years    Types: Cigarettes    Quit date: 11/20/1999  . Smokeless tobacco: Never Used  . Alcohol Use: Yes     Comment: occasional wine  . Drug Use: No  . Sexual Activity: Not on file   Other Topics Concern  . Not on file   Social History Narrative    Review of Systems: General: Positive for weight loss. Denies fever, chills, fatigue, weakness. Admits dizziness Eyes: Negative for vision changes.  ENT: Negative for difficulty swallowing. CV: Negative for chest pain, angina, palpitations, dyspnea on exertion, peripheral edema.  Respiratory: Negative for dyspnea at rest, sputum, wheezing.  GI: See history of present illness. MS: Negative for joint pain, low back pain.  Derm: Negative for rash or itching.  Neuro: Negative for weakness, seizure, frequent headaches, memory loss, confusion.  Psych: Negative for anxiety, depression.  Endo: Negative for unusual weight change.  Heme: Negative for bruising or bleeding. Allergy: Negative for rash or hives.   Physical Exam: BP 149/75 mmHg  Pulse 66  Temp(Src) 97 F (36.1 C)  Ht 5' 3" (1.6 m)  Wt 176 lb 3.2 oz (79.924 kg)  BMI 31.22 kg/m2 General:   Alert and oriented. Pleasant and cooperative. Well-nourished and well-developed.  Head:  Normocephalic and atraumatic. Eyes:  Without icterus, sclera clear and conjunctiva pink.  Ears:  Normal auditory acuity. Nose:  No deformity, discharge,  or lesions. Mouth:  No deformity or lesions, oral mucosa pink. No OP edema. Neck:  Supple, without mass or thyromegaly. Lungs:  Clear to auscultation bilaterally. No  wheezes, rales, or rhonchi. No distress.  Heart:  S1, S2 present without murmurs appreciated.  Abdomen:  +BS, soft,and non-distended. Some increased periumbilical TTP. No HSM noted. No guarding or rebound. No masses appreciated.  Rectal:  Deferred  Msk:  Symmetrical without gross deformities. Normal posture. Pulses:  Normal DP pulses noted. Extremities:  Without clubbing or edema. Neurologic:  Alert and  oriented x4;  grossly normal neurologically. Skin:  Intact without significant lesions or rashes. Psych:  Alert and cooperative. Normal mood and affect.     12/16/2014 9:55 AM  

## 2015-01-14 NOTE — Interval H&P Note (Signed)
History and Physical Interval Note:  01/14/2015 8:10 AM  Kari Bullock  has presented today for surgery, with the diagnosis of periumbilical pain/history of polyps  The various methods of treatment have been discussed with the patient and family. After consideration of risks, benefits and other options for treatment, the patient has consented to  Procedure(s) with comments: COLONOSCOPY (N/A) - 930 - moved to 10:15 ESOPHAGOGASTRODUODENOSCOPY (EGD) (N/A) as a surgical intervention .  The patient's history has been reviewed, patient examined, no change in status, stable for surgery.  I have reviewed the patient's chart and labs.  Questions were answered to the patient's satisfaction.     Kari Bullock  Upper GI tract symptoms have resolved with resumption of Prilosec. Denies abdominal pain denies reflux or dysphagia. Watery diarrhea for months now. Colonoscopy only today per plan for surveillance purposes. Findings of CT reviewed. Pulmonary evaluation underway.The risks, benefits, limitations, alternatives and imponderables have been reviewed with the patient. Questions have been answered. All parties are agreeable.

## 2015-01-14 NOTE — Op Note (Addendum)
[facility] [address] [place], [address]   COLONOSCOPY PROCEDURE REPORT  PATIENT: Kari Bullock, Kari Bullock  MR#: [id number] BIRTHDATE: 02-10-1941 , 73  yrs. old GENDER: female ENDOSCOPIST: R.  Garfield Cornea, MD FACP [facility] REFERRED LZ:JQBHA Everette Rank, M.D. PROCEDURE DATE:  Jan 21, 2015 PROCEDURE:   Ileo-colonoscopy, surveillance and with biopsy INDICATIONS:Chronic diarrhea; history of colonic adenoma?"distant past ; Since resuming Prilosec 20 mg daily, periumbilical pain has resolved. Also Carafate 1 g 3 times daily was added to her regimen since her office visit. This is been associated with complete resolution of abdominal pain. CT findings noted. MEDICATIONS: Versed 6 mg IV and 75 mg IV in divided doses.  Zofran 4 mg IV. ASA CLASS:       Class III  CONSENT: The risks, benefits, alternatives and imponderables including but not limited to bleeding, perforation as well as the possibility of a missed lesion have been reviewed.  The potential for biopsy, lesion removal, etc. have also been discussed. Questions have been answered.  All parties agreeable.  Please see the history and physical in the medical record for more information.  DESCRIPTION OF PROCEDURE:   After the risks benefits and alternatives of the procedure were thoroughly explained, informed consent was obtained.  The digital rectal exam revealed no abnormalities of the rectum.   The EC-[id number] ([id number])  endoscope was introduced through the anus and advanced to the terminal ileum which was intubated for a short distance. No adverse events experienced.   The quality of the prep was adequate  The instrument was then slowly withdrawn as the colon was fully examined.      COLON FINDINGS: Normal-appearing rectal mucosa.  Extensive left-sided diverticula; sigmoid colon was somewhat stiff and would not admit the adult colonoscope; I withdrew the adult scope and obtained a pediatric colonoscope was able to  fairly easily advanced to the cecum.  The remainder of the colonic mucosa appeared normal. The distal 10 cm of terminal ileal mucosa also appeared normal. Retroflexion was performed. .  Withdrawal time=13 minutes 0 seconds.  The scope was withdrawn and the procedure completed. COMPLICATIONS: There were no immediate complications.  ENDOSCOPIC IMPRESSION: Colonic diverticulosis. Status post segmental biopsy and stool sampling . Since upper GI tract symptoms have resolved and CT of abdomen negative, EGD not performed today.  RECOMMENDATIONS: Follow-up on pathology, stool studies. Continue Prilosec every day.  Continue Carafate. Continue to follow-up on pulmonary evaluation regarding abnormality seen on recent CT  eSigned:  R. Garfield Cornea, MD Rosalita Chessman [facility] 01/21/2015 9:03 AM Revised: 21-Jan-2015 9:03 AM  cc:  CPT CODES: ICD CODES:  The ICD and CPT codes recommended by this software are interpretations from the data that the clinical staff has captured with the software.  The verification of the translation of this report to the ICD and CPT codes and modifiers is the sole responsibility of the health care institution and practicing physician where this report was generated.  [employer] will not be held responsible for the validity of the ICD and CPT codes included on this report.  AMA assumes no liability for data contained or not contained herein. CPT is a Designer, television/film set of the [employer].  PATIENT NAME:  Kari Bullock, Kari Bullock MR#: [id number]

## 2015-01-14 NOTE — Discharge Instructions (Addendum)
Colonoscopy Discharge Instructions  Read the instructions outlined below and refer to this sheet in the next few weeks. These discharge instructions provide you with general information on caring for yourself after you leave the hospital. Your doctor may also give you specific instructions. While your treatment has been planned according to the most current medical practices available, unavoidable complications occasionally occur. If you have any problems or questions after discharge, call Dr. Gala Romney at [phone]. ACTIVITY  You may resume your regular activity, but move at a slower pace for the next 24 hours.   Take frequent rest periods for the next 24 hours.   Walking will help get rid of the air and reduce the bloated feeling in your belly (abdomen).   No driving for 24 hours (because of the medicine (anesthesia) used during the test).    Do not sign any important legal documents or operate any machinery for 24 hours (because of the anesthesia used during the test).  NUTRITION  Drink plenty of fluids.   You may resume your normal diet as instructed by your doctor.   Begin with a light meal and progress to your normal diet. Heavy or fried foods are harder to digest and may make you feel sick to your stomach (nauseated).   Avoid alcoholic beverages for 24 hours or as instructed.  MEDICATIONS  You may resume your normal medications unless your doctor tells you otherwise.  WHAT YOU CAN EXPECT TODAY  Some feelings of bloating in the abdomen.   Passage of more gas than usual.   Spotting of blood in your stool or on the toilet paper.  IF YOU HAD POLYPS REMOVED DURING THE COLONOSCOPY:  No aspirin products for 7 days or as instructed.   No alcohol for 7 days or as instructed.   Eat a soft diet for the next 24 hours.  FINDING OUT THE RESULTS OF YOUR TEST Not all test results are available during your visit. If your test results are not back during the visit, make an appointment  with your caregiver to find out the results. Do not assume everything is normal if you have not heard from your caregiver or the medical facility. It is important for you to follow up on all of your test results.  SEEK IMMEDIATE MEDICAL ATTENTION IF:  You have more than a spotting of blood in your stool.   Your belly is swollen (abdominal distention).   You are nauseated or vomiting.   You have a temperature over 101.   You have abdominal pain or discomfort that is severe or gets worse throughout the day.    Diverticulosis information provided  Further recommendations to follow pending review of pathology report  Diverticulosis Diverticulosis is the condition that develops when small pouches (diverticula) form in the wall of your colon. Your colon, or large intestine, is where water is absorbed and stool is formed. The pouches form when the inside layer of your colon pushes through weak spots in the outer layers of your colon. CAUSES  No one knows exactly what causes diverticulosis. RISK FACTORS  Being older than 40. Your risk for this condition increases with age. Diverticulosis is rare in people younger than 40 years. By age 68, almost everyone has it.  Eating a low-fiber diet.  Being frequently constipated.  Being overweight.  Not getting enough exercise.  Smoking.  Taking over-the-counter pain medicines, like aspirin and ibuprofen. SYMPTOMS  Most people with diverticulosis do not have symptoms. DIAGNOSIS  Because diverticulosis often  has no symptoms, health care providers often discover the condition during an exam for other colon problems. In many cases, a health care provider will diagnose diverticulosis while using a flexible scope to examine the colon (colonoscopy). TREATMENT  If you have never developed an infection related to diverticulosis, you may not need treatment. If you have had an infection before, treatment may include:  Eating more fruits, vegetables,  and grains.  Taking a fiber supplement.  Taking a live bacteria supplement (probiotic).  Taking medicine to relax your colon. HOME CARE INSTRUCTIONS   Drink at least 6-8 glasses of water each day to prevent constipation.  Try not to strain when you have a bowel movement.  Keep all follow-up appointments. If you have had an infection before:  Increase the fiber in your diet as directed by your health care provider or dietitian.  Take a dietary fiber supplement if your health care provider approves.  Only take medicines as directed by your health care provider. SEEK MEDICAL CARE IF:   You have abdominal pain.  You have bloating.  You have cramps.  You have not gone to the bathroom in 3 days. SEEK IMMEDIATE MEDICAL CARE IF:   Your pain gets worse.  Yourbloating becomes very bad.  You have a fever or chills, and your symptoms suddenly get worse.  You begin vomiting.  You have bowel movements that are bloody or black. MAKE SURE YOU:  Understand these instructions.  Will watch your condition.  Will get help right away if you are not doing well or get worse. Document Released: 06/01/2004 Document Revised: 09/09/2013 Document Reviewed: 07/30/2013 [facility] Patient Information 2015 [place], [place]. This information is not intended to replace advice given to you by your health care provider. Make sure you discuss any questions you have with your health care provider.     Continue Prilosec 20 mg daily  Continue Carafate 1 g before meals (this will coat your stomach and help with excess acid as well)  Further recommendations to follow pending review of pathology report and stool studies.

## 2015-01-15 ENCOUNTER — Encounter: Payer: Self-pay | Admitting: Dietician

## 2015-01-15 ENCOUNTER — Encounter (HOSPITAL_COMMUNITY): Payer: Self-pay | Admitting: Internal Medicine

## 2015-01-15 NOTE — Progress Notes (Signed)
Patient identified to be at risk for malnutrition on the MST secondary to Weight loss and poor appetite  Contacted Pt by Phone  Wt Readings from Last 10 Encounters:  01/14/15 165 lb (74.844 kg)  01/13/15 165 lb (74.844 kg)  01/11/15 172 lb 9.6 oz (78.291 kg)  12/16/14 176 lb 3.2 oz (79.924 kg)  10/27/14 176 lb (79.833 kg)  09/01/14 167 lb (75.751 kg)  04/23/14 179 lb (81.194 kg)  01/12/14 181 lb (82.101 kg)  10/24/13 235 lb (106.595 kg)  01/31/13 178 lb (80.74 kg)   Patient weight has decreased by about 10 pounds in the last 2 months.   Patient reports oral intake as fair, but has been suffering from diarrhea  Pt's weight loss and poor appetite have been likely due to watery diarrhea she has had for months now. She undewent an  investigative Colonoscopy  that did not reveal anything. Stool samples sent.  Pt has had breast cancer before. She received radiation, but no chemo. She is still be worked up and no definite course of treatment has been decided upon as of now. Let her know what my purpose would be if she decides to undergo treatment at [facility].   She did not seem to be too worried and said she could stand to lose a little weight. She said her husband was just diagnosed with DM2 and was curious how she could sign up for those classes.   Mailed my contact info, info on DM classes, and handouts titled "Diarrhea".    Burtis Junes RD, LDN Nutrition Pager: [phone] 01/15/2015 11:17 AM

## 2015-01-16 LAB — GI PATHOGEN PANEL BY PCR, STOOL
C difficile toxin A/B: NOT DETECTED
CRYPTOSPORIDIUM BY PCR: NOT DETECTED
Campylobacter by PCR: NOT DETECTED
E COLI 0157 BY PCR: NOT DETECTED
E coli (ETEC) LT/ST: NOT DETECTED
E coli (STEC): NOT DETECTED
G LAMBLIA BY PCR: NOT DETECTED
NOROVIRUS G1/G2: NOT DETECTED
Rotavirus A by PCR: NOT DETECTED
SALMONELLA BY PCR: NOT DETECTED
SHIGELLA BY PCR: NOT DETECTED

## 2015-01-18 ENCOUNTER — Encounter (HOSPITAL_COMMUNITY): Payer: Self-pay | Admitting: *Deleted

## 2015-01-18 ENCOUNTER — Encounter: Payer: Self-pay | Admitting: Internal Medicine

## 2015-01-18 ENCOUNTER — Telehealth: Payer: Self-pay

## 2015-01-18 MED ORDER — MUPIROCIN 2 % EX OINT
1.0000 "application " | TOPICAL_OINTMENT | CUTANEOUS | Status: AC
  Administered 2015-01-19: 1 via TOPICAL
  Filled 2015-01-18: qty 22

## 2015-01-18 NOTE — Telephone Encounter (Signed)
APPOINTMENT MADE AND LETTER SENT °

## 2015-01-18 NOTE — Telephone Encounter (Signed)
Letter mailed to the pt. 

## 2015-01-18 NOTE — Telephone Encounter (Signed)
Per RMR- Send letter to patient.  Send copy of letter with path to referring provider and PCP.   Office visit in 4-6 weeks if appointment already made

## 2015-01-19 ENCOUNTER — Encounter (HOSPITAL_COMMUNITY): Payer: Self-pay | Admitting: *Deleted

## 2015-01-19 ENCOUNTER — Ambulatory Visit (HOSPITAL_COMMUNITY): Payer: Medicare HMO

## 2015-01-19 ENCOUNTER — Ambulatory Visit (HOSPITAL_COMMUNITY): Payer: Medicare HMO | Admitting: Certified Registered Nurse Anesthetist

## 2015-01-19 ENCOUNTER — Encounter (HOSPITAL_COMMUNITY)
Admission: RE | Disposition: A | Discharge status: Home / Self Care | Payer: Self-pay | Source: Ambulatory Visit | Attending: Cardiothoracic Surgery

## 2015-01-19 ENCOUNTER — Ambulatory Visit (HOSPITAL_COMMUNITY)
Admission: RE | Admit: 2015-01-19 | Discharge: 2015-01-19 | Disposition: A | Discharge status: Home / Self Care | Payer: Medicare HMO | Source: Ambulatory Visit | Attending: Cardiothoracic Surgery | Admitting: Cardiothoracic Surgery

## 2015-01-19 DIAGNOSIS — R911 Solitary pulmonary nodule: Secondary | ICD-10-CM | POA: Diagnosis not present

## 2015-01-19 DIAGNOSIS — Z87891 Personal history of nicotine dependence: Secondary | ICD-10-CM | POA: Diagnosis not present

## 2015-01-19 DIAGNOSIS — J449 Chronic obstructive pulmonary disease, unspecified: Secondary | ICD-10-CM | POA: Diagnosis not present

## 2015-01-19 DIAGNOSIS — I509 Heart failure, unspecified: Secondary | ICD-10-CM | POA: Diagnosis not present

## 2015-01-19 DIAGNOSIS — I10 Essential (primary) hypertension: Secondary | ICD-10-CM | POA: Diagnosis not present

## 2015-01-19 DIAGNOSIS — I252 Old myocardial infarction: Secondary | ICD-10-CM | POA: Insufficient documentation

## 2015-01-19 DIAGNOSIS — R590 Localized enlarged lymph nodes: Secondary | ICD-10-CM | POA: Diagnosis not present

## 2015-01-19 DIAGNOSIS — Z9889 Other specified postprocedural states: Secondary | ICD-10-CM

## 2015-01-19 HISTORY — DX: Diarrhea, unspecified: R19.7

## 2015-01-19 HISTORY — DX: Anemia, unspecified: D64.9

## 2015-01-19 HISTORY — PX: VIDEO BRONCHOSCOPY WITH ENDOBRONCHIAL ULTRASOUND: SHX6177

## 2015-01-19 HISTORY — DX: Headache: R51

## 2015-01-19 HISTORY — DX: Headache, unspecified: R51.9

## 2015-01-19 LAB — CBC
HCT: 43 % (ref 36.0–46.0)
Hemoglobin: 13.8 g/dL (ref 12.0–15.0)
MCH: 27.7 pg (ref 26.0–34.0)
MCHC: 32.1 g/dL (ref 30.0–36.0)
MCV: 86.3 fL (ref 78.0–100.0)
Platelets: 182 10*3/uL (ref 150–400)
RBC: 4.98 MIL/uL (ref 3.87–5.11)
RDW: 13.5 % (ref 11.5–15.5)
WBC: 5.5 10*3/uL (ref 4.0–10.5)

## 2015-01-19 LAB — SURGICAL PCR SCREEN
MRSA, PCR: NEGATIVE
Staphylococcus aureus: NEGATIVE

## 2015-01-19 LAB — COMPREHENSIVE METABOLIC PANEL
ALT: 18 U/L (ref 14–54)
AST: 22 U/L (ref 15–41)
Albumin: 4 g/dL (ref 3.5–5.0)
Alkaline Phosphatase: 60 U/L (ref 38–126)
Anion gap: 13 (ref 5–15)
BUN: 13 mg/dL (ref 6–20)
CALCIUM: 10.5 mg/dL — AB (ref 8.9–10.3)
CO2: 24 mmol/L (ref 22–32)
Chloride: 102 mmol/L (ref 101–111)
Creatinine, Ser: 0.71 mg/dL (ref 0.44–1.00)
GFR calc Af Amer: >60 mL/min (ref >60)
GFR calc non Af Amer: >60 mL/min (ref >60)
Glucose, Bld: 108 mg/dL — ABNORMAL HIGH (ref 70–99)
Potassium: 4 mmol/L (ref 3.5–5.1)
SODIUM: 139 mmol/L (ref 135–145)
TOTAL PROTEIN: 7 g/dL (ref 6.5–8.1)
Total Bilirubin: 1.1 mg/dL (ref 0.3–1.2)

## 2015-01-19 LAB — PROTIME-INR
INR: 1.04 (ref 0.00–1.49)
Prothrombin Time: 13.7 seconds (ref 11.6–15.2)

## 2015-01-19 LAB — APTT: aPTT: 35 seconds (ref 24–37)

## 2015-01-19 SURGERY — BRONCHOSCOPY, WITH EBUS
Anesthesia: General | Site: Bronchus

## 2015-01-19 MED ORDER — FENTANYL CITRATE (PF) 250 MCG/5ML IJ SOLN
INTRAMUSCULAR | Status: AC
  Filled 2015-01-19: qty 5

## 2015-01-19 MED ORDER — ACETAMINOPHEN 325 MG PO TABS: 650.0000 mg | ORAL_TABLET | ORAL | Status: DC | PRN

## 2015-01-19 MED ORDER — MIDAZOLAM HCL 2 MG/2ML IJ SOLN
INTRAMUSCULAR | Status: AC
  Filled 2015-01-19: qty 2

## 2015-01-19 MED ORDER — SODIUM CHLORIDE 0.9 % IJ SOLN: 3.0000 mL | INTRAMUSCULAR | Status: DC | PRN

## 2015-01-19 MED ORDER — FENTANYL CITRATE (PF) 100 MCG/2ML IJ SOLN: 25.0000 ug | INTRAMUSCULAR | Status: DC | PRN

## 2015-01-19 MED ORDER — LIDOCAINE HCL (CARDIAC) 20 MG/ML IV SOLN
INTRAVENOUS | Status: DC | PRN
  Administered 2015-01-19: 60 mg via INTRAVENOUS

## 2015-01-19 MED ORDER — SUGAMMADEX SODIUM 500 MG/5ML IV SOLN
INTRAVENOUS | Status: DC | PRN
  Administered 2015-01-19: 149.6 mg via INTRAVENOUS

## 2015-01-19 MED ORDER — CEFAZOLIN SODIUM-DEXTROSE 2-3 GM-% IV SOLR
INTRAVENOUS | Status: DC | PRN
  Administered 2015-01-19: 2 g via INTRAVENOUS

## 2015-01-19 MED ORDER — FENTANYL CITRATE (PF) 100 MCG/2ML IJ SOLN
INTRAMUSCULAR | Status: DC | PRN
  Administered 2015-01-19: 50 ug via INTRAVENOUS
  Administered 2015-01-19: 25 ug via INTRAVENOUS
  Administered 2015-01-19: 50 ug via INTRAVENOUS
  Administered 2015-01-19: 100 ug via INTRAVENOUS
  Administered 2015-01-19: 25 ug via INTRAVENOUS

## 2015-01-19 MED ORDER — LIDOCAINE HCL (CARDIAC) 20 MG/ML IV SOLN
INTRAVENOUS | Status: AC
  Filled 2015-01-19: qty 5

## 2015-01-19 MED ORDER — EPHEDRINE SULFATE 50 MG/ML IJ SOLN
INTRAMUSCULAR | Status: DC | PRN
  Administered 2015-01-19: 10 mg via INTRAVENOUS
  Administered 2015-01-19 (×2): 5 mg via INTRAVENOUS

## 2015-01-19 MED ORDER — ACETAMINOPHEN 650 MG RE SUPP: 650.0000 mg | RECTAL | Status: DC | PRN

## 2015-01-19 MED ORDER — PROPOFOL 10 MG/ML IV BOLUS
INTRAVENOUS | Status: DC | PRN
  Administered 2015-01-19: 160 mg via INTRAVENOUS
  Administered 2015-01-19: 20 mg via INTRAVENOUS

## 2015-01-19 MED ORDER — LACTATED RINGERS IV SOLN
INTRAVENOUS | Status: DC
  Administered 2015-01-19 (×3): via INTRAVENOUS

## 2015-01-19 MED ORDER — ONDANSETRON HCL 4 MG/2ML IJ SOLN
INTRAMUSCULAR | Status: AC
  Filled 2015-01-19: qty 2

## 2015-01-19 MED ORDER — ACETAMINOPHEN 500 MG PO TABS
1000.0000 mg | ORAL_TABLET | Freq: Four times a day (QID) | ORAL | Status: DC
  Filled 2015-01-19 (×4): qty 2

## 2015-01-19 MED ORDER — PHENYLEPHRINE HCL 10 MG/ML IJ SOLN
INTRAMUSCULAR | Status: DC | PRN
  Administered 2015-01-19 (×2): 80 ug via INTRAVENOUS

## 2015-01-19 MED ORDER — SODIUM CHLORIDE 0.9 % IV SOLN: 250.0000 mL | INTRAVENOUS | Status: DC | PRN

## 2015-01-19 MED ORDER — ONDANSETRON HCL 4 MG/2ML IJ SOLN
INTRAMUSCULAR | Status: DC | PRN
  Administered 2015-01-19: 4 mg via INTRAVENOUS

## 2015-01-19 MED ORDER — ROCURONIUM BROMIDE 100 MG/10ML IV SOLN
INTRAVENOUS | Status: DC | PRN
  Administered 2015-01-19: 10 mg via INTRAVENOUS
  Administered 2015-01-19: 40 mg via INTRAVENOUS

## 2015-01-19 MED ORDER — SODIUM CHLORIDE 0.9 % IJ SOLN: 3.0000 mL | Freq: Two times a day (BID) | INTRAMUSCULAR | Status: DC

## 2015-01-19 MED ORDER — PROPOFOL 10 MG/ML IV BOLUS
INTRAVENOUS | Status: AC
  Filled 2015-01-19: qty 20

## 2015-01-19 SURGICAL SUPPLY — 27 items
BRUSH CYTOL CELLEBRITY 1.5X140 (MISCELLANEOUS) ×3 IMPLANT
CANISTER SUCTION 2500CC (MISCELLANEOUS) ×3 IMPLANT
CONT SPEC 4OZ CLIKSEAL STRL BL (MISCELLANEOUS) ×3 IMPLANT
COTTONBALL LRG STERILE PKG (GAUZE/BANDAGES/DRESSINGS) IMPLANT
COVER TABLE BACK 60X90 (DRAPES) ×3 IMPLANT
FORCEPS BIOP RJ4 1.8 (CUTTING FORCEPS) IMPLANT
GAUZE SPONGE 4X4 12PLY STRL (GAUZE/BANDAGES/DRESSINGS) IMPLANT
GLOVE BIO SURGEON STRL SZ7.5 (GLOVE) ×3 IMPLANT
KIT CLEAN ENDO COMPLIANCE (KITS) ×6 IMPLANT
KIT ROOM TURNOVER OR (KITS) ×3 IMPLANT
MARKER SKIN DUAL TIP RULER LAB (MISCELLANEOUS) ×3 IMPLANT
NEEDLE 22X1 1/2 (OR ONLY) (NEEDLE) IMPLANT
NEEDLE BIOPSY TRANSBRONCH 21G (NEEDLE) IMPLANT
NEEDLE BLUNT 18X1 FOR OR ONLY (NEEDLE) IMPLANT
NEEDLE SONO TIP II EBUS (NEEDLE) ×6 IMPLANT
NEEDLE SYS SONOTIP II EBUSTBNA (NEEDLE) ×3 IMPLANT
NS IRRIG 1000ML POUR BTL (IV SOLUTION) ×3 IMPLANT
OIL SILICONE PENTAX (PARTS (SERVICE/REPAIRS)) ×3 IMPLANT
PAD ARMBOARD 7.5X6 YLW CONV (MISCELLANEOUS) ×6 IMPLANT
SYR 20CC LL (SYRINGE) ×3 IMPLANT
SYR 20ML ECCENTRIC (SYRINGE) ×3 IMPLANT
SYR 5ML LUER SLIP (SYRINGE) ×3 IMPLANT
SYR CONTROL 10ML LL (SYRINGE) IMPLANT
TOWEL OR 17X26 10 PK STRL BLUE (TOWEL DISPOSABLE) ×3 IMPLANT
TRAP SPECIMEN MUCOUS 40CC (MISCELLANEOUS) ×3 IMPLANT
TUBE CONNECTING 20'X1/4 (TUBING) ×2
TUBE CONNECTING 20X1/4 (TUBING) ×4 IMPLANT

## 2015-01-19 NOTE — Anesthesia Postprocedure Evaluation (Signed)
  Anesthesia Post-op Note  Patient: Kari Bullock  Procedure(s) Performed: Procedure(s): VIDEO BRONCHOSCOPY WITH ENDOBRONCHIAL ULTRASOUND (N/A)  Patient Location: PACU  Anesthesia Type:General  Level of Consciousness: awake  Airway and Oxygen Therapy: Patient Spontanous Breathing  Post-op Pain: none  Post-op Assessment: Post-op Vital signs reviewed, Patient's Cardiovascular Status Stable, Respiratory Function Stable, Patent Airway, No signs of Nausea or vomiting and Pain level controlled  Post-op Vital Signs: Reviewed and stable  Last Vitals:  Filed Vitals:   01/19/15 1453  BP: 121/74  Pulse: 68  Temp:   Resp: 16    Complications: No apparent anesthesia complications

## 2015-01-19 NOTE — Anesthesia Preprocedure Evaluation (Signed)
Anesthesia Evaluation  Patient identified by MRN, date of birth, ID band Patient awake    Reviewed: Allergy & Precautions, NPO status , Patient's Chart, lab work & pertinent test results  History of Anesthesia Complications Negative for: history of anesthetic complications  Airway Mallampati: IV  TM Distance: >3 FB Neck ROM: Full    Dental  (+) Upper Dentures, Lower Dentures   Pulmonary neg shortness of breath, neg sleep apnea, neg COPDneg recent URI, former smoker,  breath sounds clear to auscultation        Cardiovascular hypertension, Pt. on medications - angina- Past MI and - CHF - dysrhythmias Rhythm:Regular     Neuro/Psych  Headaches, negative psych ROS   GI/Hepatic Neg liver ROS,   Endo/Other  negative endocrine ROS  Renal/GU negative Renal ROS     Musculoskeletal negative musculoskeletal ROS (+)   Abdominal   Peds  Hematology negative hematology ROS (+)   Anesthesia Other Findings   Reproductive/Obstetrics                             Anesthesia Physical Anesthesia Plan  ASA: II  Anesthesia Plan: General   Post-op Pain Management:    Induction: Intravenous  Airway Management Planned: Oral ETT  Additional Equipment: None  Intra-op Plan:   Post-operative Plan: Extubation in OR  Informed Consent: I have reviewed the patients History and Physical, chart, labs and discussed the procedure including the risks, benefits and alternatives for the proposed anesthesia with the patient or authorized representative who has indicated his/her understanding and acceptance.   Dental advisory given  Plan Discussed with: CRNA and Surgeon  Anesthesia Plan Comments:         Anesthesia Quick Evaluation

## 2015-01-19 NOTE — Progress Notes (Signed)
CT Surgery  The patient was examined and preop studies reviewed. There has been no change from the prior exam and the patient is ready for surgery. Plan videobronchoscopy and EBUS bx of mediastinal nodes on B Kari Bullock

## 2015-01-19 NOTE — Brief Op Note (Signed)
01/19/2015  12:36 PM  PATIENT:  Kari Bullock  74 y.o. female  PRE-OPERATIVE DIAGNOSIS:  MEDIASTINAL ADENOPATHY LUNG NODULE  POST-OPERATIVE DIAGNOSIS:  mediastinal adenopathy lung nodule  PROCEDURE:  Procedure(s): VIDEO BRONCHOSCOPY WITH ENDOBRONCHIAL ULTRASOUND (N/A)  SURGEON:  Surgeon(s) and Role:    * Ivin Poot, MD - Primary  PHYSICIAN ASSISTANT:   ASSISTANTS: none   ANESTHESIA:   general  EBL:  Total I/O In: 500 [I.V.:500] Out: -   BLOOD ADMINISTERED:none  DRAINS: none   LOCAL MEDICATIONS USED:  NONE  SPECIMEN:  Fine Needle Aspirate and Lavage/Washing                        Right mainstem bronchus, level 7 mediastinal node  DISPOSITION OF SPECIMEN:  PATHOLOGY  COUNTS:  YES  TOURNIQUET:  * No tourniquets in log *  DICTATION: .Dragon Dictation  PLAN OF CARE: Discharge to home after PACU  PATIENT DISPOSITION:  PACU - hemodynamically stable.   Delay start of Pharmacological VTE agent (>24hrs) due to surgical blood loss or risk of bleeding: yes

## 2015-01-19 NOTE — Transfer of Care (Signed)
Immediate Anesthesia Transfer of Care Note  Patient: Kari Bullock  Procedure(s) Performed: Procedure(s): VIDEO BRONCHOSCOPY WITH ENDOBRONCHIAL ULTRASOUND (N/A)  Patient Location: PACU  Anesthesia Type:General  Level of Consciousness: confused  Airway & Oxygen Therapy: Patient Spontanous Breathing and Patient connected to face mask oxygen  Post-op Assessment: Report given to RN and Post -op Vital signs reviewed and stable  Post vital signs: Reviewed and stable  Last Vitals:  Filed Vitals:   01/19/15 0811  BP: 173/92  Pulse: 62  Temp: 36.4 C  Resp: 16    Complications: No apparent anesthesia complications

## 2015-01-19 NOTE — Anesthesia Procedure Notes (Signed)
Procedure Name: Intubation Performed by: Clearnce Sorrel Pre-anesthesia Checklist: Patient identified, Emergency Drugs available, Suction available, Patient being monitored and Timeout performed Patient Re-evaluated:Patient Re-evaluated prior to inductionOxygen Delivery Method: Circle system utilized Preoxygenation: Pre-oxygenation with 100% oxygen Intubation Type: IV induction Ventilation: Mask ventilation without difficulty Laryngoscope Size: Mac Grade View: Grade I Tube type: Oral Tube size: 8.0 mm Number of attempts: 1 Airway Equipment and Method: Stylet Placement Confirmation: ETT inserted through vocal cords under direct vision,  positive ETCO2,  CO2 detector and breath sounds checked- equal and bilateral Secured at: 20 cm Tube secured with: Tape

## 2015-01-19 NOTE — Discharge Instructions (Signed)
No driving for 24 hours Regular diet tomorrow Resume all home medications Resume normal activity tomorrow.

## 2015-01-19 NOTE — Transfer of Care (Signed)
Immediate Anesthesia Transfer of Care Note  Patient: Kari Bullock  Procedure(s) Performed: Procedure(s): VIDEO BRONCHOSCOPY WITH ENDOBRONCHIAL ULTRASOUND (N/A)  Patient Location: PACU  Anesthesia Type:General  Level of Consciousness: awake, alert  and oriented  Airway & Oxygen Therapy: Patient Spontanous Breathing and Patient connected to face mask  Post-op Assessment: Report given to RN, Post -op Vital signs reviewed and stable and Patient moving all extremities  Post vital signs: Reviewed and stable  Last Vitals:  Filed Vitals:   01/19/15 0811  BP: 173/92  Pulse: 62  Temp: 36.4 C  Resp: 16    Complications: No apparent anesthesia complications

## 2015-01-20 ENCOUNTER — Encounter (HOSPITAL_COMMUNITY): Payer: Self-pay | Admitting: Cardiothoracic Surgery

## 2015-01-20 NOTE — Op Note (Signed)
NAME:  Kari Bullock, Kari Bullock NO.:  [id number]  MEDICAL RECORD NO.:  [id number]  LOCATION:  [facility]                         FACILITY:  [facility]  PHYSICIAN:  Ivin Poot, M.D.  DATE OF BIRTH:  03/13/1941  DATE OF PROCEDURE:  01/19/2015 DATE OF DISCHARGE:  01/19/2015                              OPERATIVE REPORT   OPERATION: 1. Video bronchoscopy. 2. Endobronchial ultrasound directed biopsy of mediastinal lymph node     (EBUS).  SURGEON:  Ivin Poot, M.D.  ANESTHESIA:  General.  PREOPERATIVE DIAGNOSIS:  A 7 mm right middle lobe nodule with metabolic activity on PET scan with hypermetabolic subcarinal lymph node.  POSTOPERATIVE DIAGNOSIS:  A 7 mm right middle lobe nodule with metabolic activity on PET scan with hypermetabolic subcarinal lymph node.  DESCRIPTION OF PROCEDURE:  The patient was brought to the operating room for bronchoscopy, examination of the airways and transbronchial needle aspirate of a hypermetabolic subcarinal lymph node.  The patient has a previous smoking history.  She recently developed GI symptoms and an abdominal CT scan demonstrated the 7 mm nodule in the right middle lobe and a followup PET scan showed this to be hypermetabolic with an intensely hypermetabolic subcarinal node of normal size.  I discussed the procedure, bronchoscopy, and biopsy with the patient, and she understood and agreed to proceed.  OPERATIVE PROCEDURE:  The patient was brought to the operating room and placed supine on the operating room table, where general anesthesia was induced.  Through the endotracheal tube after proper time-out, the flexible video bronchoscope was passed.  The distal trachea and carina were normal.  The left mainstem bronchus was normal.  The segmental orifices of the left upper lobe and left lower lobe were all individually examined and were normal without endobronchial lesions.  The bronchoscope was drawn back to the right mainstem  bronchus.  The right mainstem bronchus had no abnormalities.  The endobronchial segments of the right upper lobe, right middle lobe, and right lower lobe were all visualized.  Washings were taken of the right lung bronchial tree.  Brushings were taken of the right mainstem bronchus just beyond the carina.  The bronchoscope was withdrawn.  The EBUS scope was then passed.  The EBUS scope was used to ultrasound identified the subcarinal lymph node.  This was more underneath the right mainstem bronchus.  Using the EBUS scope, 9 needle aspirates were obtained and sent for cytopathology.  There was no significant bleeding. The bronchoscope was withdrawn.  The patient was then reversed from anesthesia and returned to the recovery room.     Ivin Poot, M.D.     PV/MEDQ  D:  01/19/2015  T:  01/20/2015  Job:  [id number]  cc:   Angus G. Everette Rank, MD

## 2015-01-29 ENCOUNTER — Ambulatory Visit (HOSPITAL_COMMUNITY)
Admission: RE | Admit: 2015-01-29 | Discharge: 2015-01-29 | Disposition: A | Discharge status: Home / Self Care | Payer: Medicare HMO | Source: Ambulatory Visit | Attending: Hematology & Oncology | Admitting: Hematology & Oncology

## 2015-01-29 DIAGNOSIS — Z1231 Encounter for screening mammogram for malignant neoplasm of breast: Secondary | ICD-10-CM

## 2015-02-01 ENCOUNTER — Encounter (HOSPITAL_COMMUNITY): Payer: Medicare HMO | Attending: Oncology | Admitting: Hematology & Oncology

## 2015-02-01 ENCOUNTER — Encounter (HOSPITAL_COMMUNITY): Payer: Self-pay | Admitting: Hematology & Oncology

## 2015-02-01 VITALS — BP 126/66 | HR 55 | Temp 97.9°F | Resp 16 | Wt 170.1 lb

## 2015-02-01 DIAGNOSIS — R911 Solitary pulmonary nodule: Secondary | ICD-10-CM | POA: Diagnosis not present

## 2015-02-01 DIAGNOSIS — Z853 Personal history of malignant neoplasm of breast: Secondary | ICD-10-CM | POA: Diagnosis not present

## 2015-02-01 DIAGNOSIS — R918 Other nonspecific abnormal finding of lung field: Secondary | ICD-10-CM | POA: Diagnosis not present

## 2015-02-01 DIAGNOSIS — C50911 Malignant neoplasm of unspecified site of right female breast: Secondary | ICD-10-CM | POA: Insufficient documentation

## 2015-02-01 DIAGNOSIS — IMO0001 Reserved for inherently not codable concepts without codable children: Secondary | ICD-10-CM

## 2015-02-01 DIAGNOSIS — C50919 Malignant neoplasm of unspecified site of unspecified female breast: Secondary | ICD-10-CM

## 2015-02-01 NOTE — Patient Instructions (Signed)
[place] at [facility] Discharge Instructions  RECOMMENDATIONS MADE BY THE CONSULTANT AND ANY TEST RESULTS WILL BE SENT TO YOUR REFERRING PHYSICIAN.  Discussion by Dr. Whitney Muse. Dr. Whitney Muse will send a not to Dr. Nils Pyle to see what else he plans to do. Keep your appointment with Dr. Gala Romney as scheduled for your GI issues.  We will see you back in 3 weeks.  Thank you for choosing [place] at [facility] to provide your oncology and hematology care.  To afford each patient quality time with our provider, please arrive at least 15 minutes before your scheduled appointment time.    You need to re-schedule your appointment should you arrive 10 or more minutes late.  We strive to give you quality time with our providers, and arriving late affects you and other patients whose appointments are after yours.  Also, if you no show three or more times for appointments you may be dismissed from the clinic at the providers discretion.     Again, thank you for choosing [facility].  Our hope is that these requests will decrease the amount of time that you wait before being seen by our physicians.       _____________________________________________________________  Should you have questions after your visit to [facility], please contact our office at [phone] between the hours of 8:30 a.m. and 4:30 p.m.  Voicemails left after 4:30 p.m. will not be returned until the following business day.  For prescription refill requests, have your pharmacy contact our office.

## 2015-02-01 NOTE — Progress Notes (Signed)
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 ER +90%, PR 30% Her -2 neu indeterminate. Surgery, XRT, hormonal therapy. Intolerant of Tamoxifen, arimidex X 5 years completed 02/15/2005  CT abdomen 12/18/2014 6 mm RML pulmonary nodule, 17 mm nodule in LL Base was 75m previously  PET/CT 7 mm RML nodule hypermetabolic, hypermetabolic activity along the posterior inferior margin of the R mainstem bronchus, non-enlarged subcarinal LN  CURRENT THERAPY: Work-up for newly discovered pulmonary nodule. EBUS 01/19/2015 with Dr. VPrescott Gum INTERVAL HISTORY: Kari Bullock returns today for additional f/u of a recently found 7 mm right middle lobe nodule that is hypermetabolic on PET imaging with a hypermetabolic subcarinal lymph node concerning for malignancy found incidentally after a CT abdomen was performed by GI for periumbilical pain, diarrhea, and weight loss x 1 month.  [facility], grade 2 (T1 A., N1 a) with 2 of 4 positive lymph nodes, ER +90%, PR +30%, lymph node metastases were 1 mm or slightly less. HER-2/neu was indeterminant at 2+. She was diagnosed in February 2001 treated with surgery followed by radiation therapy and then hormonal therapy. She could not tolerate tamoxifen so was switched to Arimidex which she took for total 5 years ending on 02/15/2005, no evidence of disease.  Pathology from the LN biopsy was negative for malignancy, bronchial washings showed abnormal cells.  Per the patient she does not have follow-up with Dr. VPrescott Gum  She is to see Dr. RGala Romneynext Monday for a consultation because her stomach is still burning. She has been experiencing a burning sensation in her stomach for about a month now. She says that she has been eating bananas and yogurt like she was  advised to start out with. She was prescribed a pill that she is able to mash up in apple sauce which alleviates the pain. Her appetite has been good, though she has to be careful as to what to eat as to not upset her stomach.    She is up to date on her mammograms, her last one being done last Friday, 01/30/2015.   Quit smoking in 2011, was previously smoking less than a pack a day.   Past Medical History  Diagnosis Date  . Hypertension   . Breast cancer     2001 RT breat lumpectomy rad tx  . GERD (gastroesophageal reflux disease)   . Memory loss   . Pure hypercholesterolemia   . Memory change 10/24/2013  . 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 Pulmonary nodule 01/09/2015  . Headache     years ago, none since hysterectomy  . Anemia     in the past  . Diarrhea   . Complication of anesthesia     allergic reaction to Morphine    has Memory change; Periumbilical pain; Diarrhea; GERD (gastroesophageal reflux disease); Infiltrating ductal carcinoma of right breast; Pulmonary nodule; Diverticulosis of colon without hemorrhage; and Chronic diarrhea on her problem list.     is allergic to codeine; morphine and related; sulfur; and tamoxifen.  Kari Bullock not currently have medications on file.  Past Surgical History  Procedure Laterality Date  . Abdominal hysterectomy    . Bilateral oophorectomy  2007  . Incision and drainage of wound  2011    buttocks  .  Tonsillectomy    . Cataract extraction Bilateral     lens implant  . Carpal tunnel release Right   . Colonoscopy  2011    RMR: 1. Anal papilla, otherwise normal rectum. 2. Left-sided diverticula, single diminutive polyp in the sigmoid segment status post cold biospy removal. Remainder of the colonic mucosa appeared normal.   . Colonoscopy N/A 01/14/2015    Procedure: COLONOSCOPY;  Surgeon: Daneil Dolin, MD;  [facility];  Service: Endoscopy;  Laterality: N/A;  930 - moved to 10:15  .  Esophagogastroduodenoscopy N/A 01/14/2015    Procedure: ESOPHAGOGASTRODUODENOSCOPY (EGD);  Surgeon: Daneil Dolin, MD;  Location: [facility];  Service: Endoscopy;  Laterality: N/A;  . Breast surgery      right lumpectomy, lymph glands remove  . Video bronchoscopy with endobronchial ultrasound N/A 01/19/2015    Procedure: VIDEO BRONCHOSCOPY WITH ENDOBRONCHIAL ULTRASOUND;  Surgeon: Ivin Poot, MD;  Location: [facility] OR;  Service: Thoracic;  Laterality: N/A;    Denies any headaches, dizziness, double vision, fevers, chills, night sweats, nausea, vomiting, diarrhea, constipation, chest pain, heart palpitations, shortness of breath, blood in stool, black tarry stool, urinary pain, urinary burning, urinary frequency, hematuria, appetite loss.  Positive for stomach pain.     She reports it as being a burning sensation.   PHYSICAL EXAMINATION  ECOG PERFORMANCE STATUS: 1 - Symptomatic but completely ambulatory  There were no vitals filed for this visit.  GENERAL:alert, no distress, well nourished, well developed, comfortable, cooperative, obese, smiling and accompanied by her daughter. SKIN: skin color, texture, turgor are normal, no rashes or significant lesions HEAD: Normocephalic, No masses, lesions, tenderness or abnormalities EYES: normal, PERRLA, EOMI, Conjunctiva are pink and non-injected EARS: External ears normal OROPHARYNX:lips, buccal mucosa, and tongue normal and mucous membranes are moist  NECK: supple, no adenopathy, thyroid normal size, non-tender, without nodularity, trachea midline LYMPH:  no palpable lymphadenopathy BREAST:not examined LUNGS: clear to auscultation  HEART: regular rate & rhythm, no murmurs, no gallops, S1 normal and S2 normal ABDOMEN:abdomen soft, non-tender, obese and normal bowel sounds BACK: Back symmetric, no curvature., No CVA tenderness EXTREMITIES:less then 2 second capillary refill, no joint deformities, effusion, or inflammation, no skin  discoloration, no clubbing, no cyanosis  NEURO: alert & oriented x 3 with fluent speech, no focal motor/sensory deficits, gait normal   LABORATORY DATA: CBC    Component Value Date/Time   WBC 5.5 01/19/2015 0817   RBC 4.98 01/19/2015 0817   HGB 13.8 01/19/2015 0817   HCT 43.0 01/19/2015 0817   PLT 182 01/19/2015 0817   MCV 86.3 01/19/2015 0817   MCH 27.7 01/19/2015 0817   MCHC 32.1 01/19/2015 0817   RDW 13.5 01/19/2015 0817   LYMPHSABS 1.9 01/11/2015 1145   MONOABS 0.7 01/11/2015 1145   EOSABS 0.2 01/11/2015 1145   BASOSABS 0.1 01/11/2015 1145      Chemistry      Component Value Date/Time   NA 139 01/19/2015 0817   K 4.0 01/19/2015 0817   CL 102 01/19/2015 0817   CO2 24 01/19/2015 0817   BUN 13 01/19/2015 0817   CREATININE 0.71 01/19/2015 0817   CREATININE 0.69 12/16/2014 1058      Component Value Date/Time   CALCIUM 10.5* 01/19/2015 0817   ALKPHOS 60 01/19/2015 0817   AST 22 01/19/2015 0817   ALT 18 01/19/2015 0817   BILITOT 1.1 01/19/2015 0817     RADIOGRAPHIC STUDIES:  Dg Chest 2 View  01/19/2015   CLINICAL DATA:  Preoperative study for biopsy of lung nodules, known mediastinal lymphadenopathy, history of right-sided breast malignancy.  EXAM: CHEST  2 VIEW  COMPARISON:  Chest x-ray of September 01, 2014 and PET-CT study of December 31, 2014.  FINDINGS: The lungs are adequately inflated. There is no focal infiltrate. There is no pleural effusion. The heart and pulmonary vascularity are normal. The mediastinum is normal in width. The trachea is midline. The bony thorax exhibits no acute abnormality. There surgical clips in the right breast bed.  IMPRESSION: There is no active cardiopulmonary disease.   Electronically Signed   By: David  Martinique M.D.   On: 01/19/2015 08:35   Dg Chest Port 1 View  01/19/2015   CLINICAL DATA:  Post- operative from bronchoscopy and lung biopsy  EXAM: PORTABLE CHEST - 1 VIEW  COMPARISON:  PA and lateral chest of today's date  FINDINGS: The lungs  are well-expanded. There is no pneumothorax, pulmonary contusion, or pleural effusion. The heart and mediastinal structures are normal. The bony thorax is unremarkable.  IMPRESSION: There is no postprocedure complication following bronchoscopy and lung biopsy.   Electronically Signed   By: David  Martinique M.D.   On: 01/19/2015 13:22   Mm Digital Screening Bilateral  01/29/2015   CLINICAL DATA:  Screening. History of right breast cancer and right lumpectomy in 2001.  EXAM: DIGITAL SCREENING BILATERAL MAMMOGRAM WITH CAD  COMPARISON:  Previous exam(s).  ACR Breast Density Category b: There are scattered areas of fibroglandular density.  FINDINGS: There are no findings suspicious for malignancy.  Right breast scarring is again identified.  Images were processed with CAD.  IMPRESSION: No mammographic evidence of malignancy. A result letter of this screening mammogram will be mailed directly to the patient.  RECOMMENDATION: Screening mammogram in one year. (Code:SM-B-01Y)  BI-RADS CATEGORY  2: Benign.   Electronically Signed   By: Margarette Canada M.D.   On: 01/29/2015 16:30     ASSESSMENT AND PLAN:  History of Stage II ER+ Her-2 neu indeterminate breast cancer diagnosed in 2001 Abnormal CT of the Chest Abnormal PET imaging of the chest EBUS with inconclusive results  I will send a note to Dr. Prescott Gum  to see if he has any additional recommendations regarding further workup of her pulmonary nodule. I reviewed the pathology with the patient and her husband. If Dr. Prescott Gum feels there is nothing additional to do at this point we can repeat her imaging studies within the next 2 months. If there is additional growth in the lesion we can send her for repeat biopsy.  She sees Dr.Rourke on Monday. I advised her would like to see her back in approximately 2 weeks to establish a formal plan moving forward. I will advise the patient once I have contacted her cardiothoracic surgeon in regards to additional  recommendations.  All questions were answered. The patient knows to call the clinic with any problems, questions or concerns. We can certainly see the patient much sooner if necessary.  This document serves as a record of services personally performed by Ancil Linsey, MD. It was created on her behalf by Arlyce Harman, a trained medical scribe. The creation of this record is based on the scribe's personal observations and the provider's statements to them. This document has been checked and approved by the attending provider.  I have reviewed the above documentation for accuracy and completeness, and I agree with the above. Molli Hazard, MD

## 2015-02-03 ENCOUNTER — Ambulatory Visit (HOSPITAL_COMMUNITY): Payer: Medicare HMO | Admitting: Hematology & Oncology

## 2015-02-03 ENCOUNTER — Other Ambulatory Visit (HOSPITAL_COMMUNITY): Payer: Medicare HMO

## 2015-02-08 ENCOUNTER — Ambulatory Visit (INDEPENDENT_AMBULATORY_CARE_PROVIDER_SITE_OTHER): Payer: Medicare HMO | Admitting: Nurse Practitioner

## 2015-02-08 ENCOUNTER — Encounter: Payer: Self-pay | Admitting: Nurse Practitioner

## 2015-02-08 ENCOUNTER — Encounter (INDEPENDENT_AMBULATORY_CARE_PROVIDER_SITE_OTHER): Payer: Self-pay

## 2015-02-08 ENCOUNTER — Other Ambulatory Visit: Payer: Self-pay

## 2015-02-08 VITALS — BP 134/75 | HR 68 | Temp 97.0°F | Ht 63.0 in | Wt 169.2 lb

## 2015-02-08 DIAGNOSIS — R599 Enlarged lymph nodes, unspecified: Secondary | ICD-10-CM | POA: Diagnosis not present

## 2015-02-08 DIAGNOSIS — R590 Localized enlarged lymph nodes: Secondary | ICD-10-CM

## 2015-02-08 DIAGNOSIS — K219 Gastro-esophageal reflux disease without esophagitis: Secondary | ICD-10-CM

## 2015-02-08 MED ORDER — OMEPRAZOLE 20 MG PO CPDR: 20.0000 mg | DELAYED_RELEASE_CAPSULE | Freq: Two times a day (BID) | ORAL | Status: DC

## 2015-02-08 NOTE — Patient Instructions (Signed)
1. Increase your purple pill (Prilosec) to 20 mg twice a day. Take it 30 minutes before eating a meal. 2. We will schedule your procedure (upper endoscopy) for you. 3. Return for further evaluation in 3 months.

## 2015-02-08 NOTE — Progress Notes (Signed)
Pt has been moved up from 03/10/15 to 02/11/15 @ 11:00. She is aware

## 2015-02-08 NOTE — Progress Notes (Signed)
Referring Provider: Marjean Donna, MD Primary Care Physician:  [name], MD Primary GI:  Dr. Gala Romney  Chief Complaint  Patient presents with  . Follow-up    HPI:   74 year old female presents for reevaluation of epigastric pain. A colonoscopy on 01/14/2015 which found diverticulosis, status post stool sampling. At that time she stated her epigastric pain had resolved since starting Prilosec and so the possible EGD was not performed. She is also been seen by cardiothoracic surgery in the interim as he abdominal CT we ordered showed a lung nodule and its follow-up PET scan showed hypermetabolic activity in the biopsy did not show metastatic disease, however bronchial washings showed abnormal cells. Is being seen by oncology and has a repeat visit with them in 2 weeks for further plan and evaluation.  Today she states her abdominal pain has returned. Is taking PPI (Prilosec) daily. Takes Carafate prn which works well for her. Takes Carafate every 2-3 days. Pain is burning and epigastric. Denies N/V. Denies hematochezia and melena. Denies any other upper or lower GI symptoms.   Past Medical History  Diagnosis Date  . Hypertension   . Breast cancer     2001 RT breat lumpectomy rad tx  . GERD (gastroesophageal reflux disease)   . Memory loss   . Pure hypercholesterolemia   . Memory change 10/24/2013  . 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 Pulmonary nodule 01/09/2015  . Headache     years ago, none since hysterectomy  . Anemia     in the past  . Diarrhea   . Complication of anesthesia     allergic reaction to Morphine    Past Surgical History  Procedure Laterality Date  . Abdominal hysterectomy    . Bilateral oophorectomy  2007  . Incision and drainage of wound  2011    buttocks  . Tonsillectomy    . Cataract extraction Bilateral     lens implant  . Carpal tunnel release Right   . Colonoscopy  2011    RMR: 1. Anal papilla, otherwise normal rectum.  2. Left-sided diverticula, single diminutive polyp in the sigmoid segment status post cold biospy removal. Remainder of the colonic mucosa appeared normal.   . Colonoscopy N/A 01/14/2015    Procedure: COLONOSCOPY;  Surgeon: Daneil Dolin, MD;  Location: [facility];  Service: Endoscopy;  Laterality: N/A;  930 - moved to 10:15  . Esophagogastroduodenoscopy N/A 01/14/2015    Procedure: ESOPHAGOGASTRODUODENOSCOPY (EGD);  Surgeon: Daneil Dolin, MD;  Location: [facility];  Service: Endoscopy;  Laterality: N/A;  . Breast surgery      right lumpectomy, lymph glands remove  . Video bronchoscopy with endobronchial ultrasound N/A 01/19/2015    Procedure: VIDEO BRONCHOSCOPY WITH ENDOBRONCHIAL ULTRASOUND;  Surgeon: Ivin Poot, MD;  Location: [facility];  Service: Thoracic;  Laterality: N/A;    Current Outpatient Prescriptions  Medication Sig Dispense Refill  . aspirin EC 81 MG tablet Take 81 mg by mouth daily.    . cholecalciferol (VITAMIN D) 1000 UNITS tablet Take 1,000 Units by mouth at bedtime.    . donepezil (ARICEPT) 10 MG tablet TAKE 1 TABLET BY MOUTH EVERY NIGHT AT BEDTIME 90 tablet 1  . losartan-hydrochlorothiazide (HYZAAR) 50-12.5 MG per tablet Take 1 tablet by mouth daily.     . naproxen sodium (ANAPROX) 220 MG tablet Take 220 mg by mouth 2 (two) times daily as needed (pain). ALEVE    . sucralfate (CARAFATE) 1 G tablet  Take 1 g by mouth 2 (two) times daily.     . polyethylene glycol-electrolytes (TRILYTE) 420 G solution Take 4,000 mLs by mouth as directed. (Patient not taking: Reported on 01/15/2015) 4000 mL 0   No current facility-administered medications for this visit.    Allergies as of 02/08/2015 - Review Complete 02/08/2015  Allergen Reaction Noted  . Codeine Nausea And Vomiting 11/20/2011  . Morphine and related Itching 11/20/2011  . Sulfur Rash 11/20/2011  . Tamoxifen Other (See Comments) 11/20/2011    Family History  Problem Relation Age of Onset  . Stroke Mother   .  Breast cancer Maternal Aunt   . Breast cancer Maternal Grandmother   . Heart attack Father   . Pancreatic cancer Brother     History   Social History  . Marital Status: Married    Spouse Name: N/A  . Number of Children: 3  . Years of Education: 12   Occupational History  . Retired    Social History Main Topics  . Smoking status: Former Smoker -- 1.00 packs/day for 20 years    Types: Cigarettes    Quit date: 11/20/1999  . Smokeless tobacco: Never Used  . Alcohol Use: Yes     Comment: occasional wine  . Drug Use: No  . Sexual Activity: Not on file   Other Topics Concern  . None   Social History Narrative    Review of Systems: General: Negative for anorexia, weight loss, fever, chills, fatigue, weakness. Eyes: Negative for vision changes.  ENT: Negative for hoarseness, difficulty swallowing. CV: Negative for chest pain, angina, palpitations, peripheral edema.  Respiratory: Negative for dyspnea at rest, cough, wheezing.  GI: See history of present illness. Neuro: Negative for weakness, memory loss, confusion.  Psych: Negative for anxiety, depression.  Endo: Negative for unusual weight change.  Heme: Negative for bruising or bleeding.   Physical Exam: BP 134/75 mmHg  Pulse 68  Temp(Src) 97 F (36.1 C)  Ht '5\' 3"'$  (1.6 m)  Wt 169 lb 3.2 oz (76.749 kg)  BMI 29.98 kg/m2 General:   Alert and oriented. Pleasant and cooperative. Well-nourished and well-developed.  Head:  Normocephalic and atraumatic. Eyes:  Without icterus, sclera clear and conjunctiva pink.  Ears:  Normal auditory acuity. Mouth:  No deformity or lesions, oral mucosa pink. No OP edema. Throat/Neck:  Supple, without mass or thyromegaly. No edema. Cardiovascular:  S1, S2 present without murmurs appreciated. Extremities without clubbing or edema. Respiratory:  Clear to auscultation bilaterally. No wheezes, rales, or rhonchi. No distress.  Gastrointestinal:  +BS, soft, and non-distended. Mild TTP  epigastric area. No HSM noted. No guarding or rebound. No masses appreciated.  Rectal:  Deferred  Neurologic:  Alert and oriented x4;  grossly normal neurologically. Psych:  Alert and cooperative. Normal mood and affect. Heme/Lymph/Immune: No significant cervical adenopathy. No excessive bruising noted.    02/08/2015 11:17 AM

## 2015-02-09 NOTE — Assessment & Plan Note (Signed)
Patient with a history of GERD which is persistent despite Prilosec 20 mg daily. Carafate when necessary works well for her, but is requiring Carafate for breakthrough symptoms every 2-3 days. At this point we'll increase her PPI to Prilosec 20 mg twice a day. Continue Carafate as needed. Return to clinic in 3 months for further evaluation. We will also proceed with an endoscopy for persistent GERD as well as proceed with TCS consult for mediastinal lymphadenopathy.  Proceed with EGD with Dr. Gala Romney in near future: the risks, benefits, and alternatives have been discussed with the patient in detail. The patient states understanding and desires to proceed.  The patient is not on any anticoagulants other than aspirin 81 mg daily. The patient is not on any chronic pain meds, tonic antianxiety meds, or antidepressants. Admits occasional/rare alcohol consumption of wine. Denies drug use. At this point conscious sedation should be sufficient.

## 2015-02-09 NOTE — Assessment & Plan Note (Addendum)
Patient with a history of breast cancer. Recently received an abdominal CT which showed a right middle lobe lesion. Follow-up PET scan showed lesion to be hypermetabolic as well as a subcarinal lymph node which is hypermetabolic. Video bronchoscopy was performed by [facility]. They've subsequently referred her back to GI for an EGD for mediastinal adenopathy to rule out T/O esophageal disease. We'll set her up for this procedure at this point. Return for follow-up in 3 months for reevaluation.  Proceed with EGD with Dr. Gala Romney in near future: the risks, benefits, and alternatives have been discussed with the patient in detail. The patient states understanding and desires to proceed.  The patient is not on any anticoagulants other than aspirin 81 mg daily. The patient is not on any chronic pain meds, tonic antianxiety meds, or antidepressants. Admits occasional/rare alcohol consumption of wine. Denies drug use. At this point conscious sedation should be sufficient.

## 2015-02-11 ENCOUNTER — Ambulatory Visit (HOSPITAL_COMMUNITY)
Admission: RE | Admit: 2015-02-11 | Discharge: 2015-02-11 | Disposition: A | Discharge status: Home / Self Care | Payer: Medicare HMO | Source: Ambulatory Visit | Attending: Internal Medicine | Admitting: Internal Medicine

## 2015-02-11 ENCOUNTER — Encounter (HOSPITAL_COMMUNITY)
Admission: RE | Disposition: A | Discharge status: Home / Self Care | Payer: Self-pay | Source: Ambulatory Visit | Attending: Internal Medicine

## 2015-02-11 ENCOUNTER — Telehealth: Payer: Self-pay

## 2015-02-11 DIAGNOSIS — Z853 Personal history of malignant neoplasm of breast: Secondary | ICD-10-CM | POA: Insufficient documentation

## 2015-02-11 DIAGNOSIS — Z885 Allergy status to narcotic agent status: Secondary | ICD-10-CM | POA: Insufficient documentation

## 2015-02-11 DIAGNOSIS — Z87891 Personal history of nicotine dependence: Secondary | ICD-10-CM | POA: Insufficient documentation

## 2015-02-11 DIAGNOSIS — Z7982 Long term (current) use of aspirin: Secondary | ICD-10-CM | POA: Insufficient documentation

## 2015-02-11 DIAGNOSIS — I10 Essential (primary) hypertension: Secondary | ICD-10-CM | POA: Insufficient documentation

## 2015-02-11 DIAGNOSIS — K219 Gastro-esophageal reflux disease without esophagitis: Secondary | ICD-10-CM | POA: Diagnosis not present

## 2015-02-11 DIAGNOSIS — K2950 Unspecified chronic gastritis without bleeding: Secondary | ICD-10-CM | POA: Insufficient documentation

## 2015-02-11 DIAGNOSIS — E780 Pure hypercholesterolemia: Secondary | ICD-10-CM | POA: Diagnosis not present

## 2015-02-11 DIAGNOSIS — Z888 Allergy status to other drugs, medicaments and biological substances status: Secondary | ICD-10-CM | POA: Diagnosis not present

## 2015-02-11 DIAGNOSIS — Z79899 Other long term (current) drug therapy: Secondary | ICD-10-CM | POA: Diagnosis not present

## 2015-02-11 DIAGNOSIS — K3189 Other diseases of stomach and duodenum: Secondary | ICD-10-CM | POA: Insufficient documentation

## 2015-02-11 DIAGNOSIS — R1013 Epigastric pain: Secondary | ICD-10-CM | POA: Diagnosis present

## 2015-02-11 DIAGNOSIS — Z882 Allergy status to sulfonamides status: Secondary | ICD-10-CM | POA: Insufficient documentation

## 2015-02-11 DIAGNOSIS — Z90710 Acquired absence of both cervix and uterus: Secondary | ICD-10-CM | POA: Diagnosis not present

## 2015-02-11 HISTORY — PX: ESOPHAGOGASTRODUODENOSCOPY: SHX5428

## 2015-02-11 SURGERY — EGD (ESOPHAGOGASTRODUODENOSCOPY)
Anesthesia: Moderate Sedation

## 2015-02-11 MED ORDER — MIDAZOLAM HCL 5 MG/5ML IJ SOLN
INTRAMUSCULAR | Status: DC | PRN
  Administered 2015-02-11 (×2): 2 mg via INTRAVENOUS

## 2015-02-11 MED ORDER — MEPERIDINE HCL 100 MG/ML IJ SOLN
INTRAMUSCULAR | Status: DC
Start: 2015-02-11 — End: 2015-02-11
  Filled 2015-02-11: qty 2

## 2015-02-11 MED ORDER — MEPERIDINE HCL 100 MG/ML IJ SOLN
INTRAMUSCULAR | Status: DC | PRN
  Administered 2015-02-11: 50 mg via INTRAVENOUS
  Administered 2015-02-11: 25 mg via INTRAVENOUS

## 2015-02-11 MED ORDER — MIDAZOLAM HCL 5 MG/5ML IJ SOLN
INTRAMUSCULAR | Status: AC
  Filled 2015-02-11: qty 10

## 2015-02-11 MED ORDER — STERILE WATER FOR IRRIGATION IR SOLN
Status: DC | PRN
  Administered 2015-02-11: 11:00:00

## 2015-02-11 MED ORDER — LIDOCAINE VISCOUS 2 % MT SOLN
OROMUCOSAL | Status: AC
  Filled 2015-02-11: qty 15

## 2015-02-11 MED ORDER — LIDOCAINE VISCOUS 2 % MT SOLN
OROMUCOSAL | Status: DC | PRN
Start: 2015-02-11 — End: 2015-02-11
  Administered 2015-02-11: 3 mL via OROMUCOSAL

## 2015-02-11 MED ORDER — ONDANSETRON HCL 4 MG/2ML IJ SOLN
INTRAMUSCULAR | Status: AC
  Filled 2015-02-11: qty 2

## 2015-02-11 MED ORDER — SODIUM CHLORIDE 0.9 % IV SOLN
INTRAVENOUS | Status: DC
  Administered 2015-02-11: 10:00:00 via INTRAVENOUS

## 2015-02-11 MED ORDER — ONDANSETRON HCL 4 MG/2ML IJ SOLN
INTRAMUSCULAR | Status: DC | PRN
  Administered 2015-02-11: 4 mg via INTRAVENOUS

## 2015-02-11 NOTE — Interval H&P Note (Signed)
History and Physical Interval Note:  02/11/2015 10:38 AM  Veneda Melter  has presented today for surgery, with the diagnosis of GERD  The various methods of treatment have been discussed with the patient and family. After consideration of risks, benefits and other options for treatment, the patient has consented to  Procedure(s) with comments: ESOPHAGOGASTRODUODENOSCOPY (EGD) (N/A) - 1200 as a surgical intervention .  The patient's history has been reviewed, patient examined, no change in status, stable for surgery.  I have reviewed the patient's chart and labs.  Questions were answered to the patient's satisfaction.     Tiffay Pinette   Abdominal pain has improved since Prilosec was in increased to twice daily. No dysphagia. Diagnostic EGD today per plan.  The risks, benefits, limitations, alternatives and imponderables have been reviewed with the patient. Potential for esophageal dilation, biopsy, etc. have also been reviewed.  Questions have been answered. All parties agreeable.

## 2015-02-11 NOTE — Discharge Instructions (Signed)
EGD Discharge instructions Please read the instructions outlined below and refer to this sheet in the next few weeks. These discharge instructions provide you with general information on caring for yourself after you leave the hospital. Your doctor may also give you specific instructions. While your treatment has been planned according to the most current medical practices available, unavoidable complications occasionally occur. If you have any problems or questions after discharge, please call your doctor. ACTIVITY  You may resume your regular activity but move at a slower pace for the next 24 hours.   Take frequent rest periods for the next 24 hours.   Walking will help expel (get rid of) the air and reduce the bloated feeling in your abdomen.   No driving for 24 hours (because of the anesthesia (medicine) used during the test).   You may shower.   Do not sign any important legal documents or operate any machinery for 24 hours (because of the anesthesia used during the test).  NUTRITION  Drink plenty of fluids.   You may resume your normal diet.   Begin with a light meal and progress to your normal diet.   Avoid alcoholic beverages for 24 hours or as instructed by your caregiver.  MEDICATIONS  You may resume your normal medications unless your caregiver tells you otherwise.  WHAT YOU CAN EXPECT TODAY  You may experience abdominal discomfort such as a feeling of fullness or gas pains.  FOLLOW-UP  Your doctor will discuss the results of your test with you.  SEEK IMMEDIATE MEDICAL ATTENTION IF ANY OF THE FOLLOWING OCCUR:  Excessive nausea (feeling sick to your stomach) and/or vomiting.   Severe abdominal pain and distention (swelling).   Trouble swallowing.   Temperature over 101 F (37.8 C).   Rectal bleeding or vomiting of blood.     Stop Prilosec for now; trial of Dexilant 60 mg daily - go to my office for free samples.  Further recommendations to follow pending  review of pathology report.

## 2015-02-11 NOTE — Telephone Encounter (Signed)
Pt was given #20 samples of Dexilant 60 mg to take once daily per Dr. Gala Romney.

## 2015-02-11 NOTE — H&P (View-Only) (Signed)
Referring Provider: Marjean Donna, MD Primary Care Physician:  [name], MD Primary GI:  Dr. Gala Romney  Chief Complaint  Patient presents with  . Follow-up    HPI:   74 year old female presents for reevaluation of epigastric pain. A colonoscopy on 01/14/2015 which found diverticulosis, status post stool sampling. At that time she stated her epigastric pain had resolved since starting Prilosec and so the possible EGD was not performed. She is also been seen by cardiothoracic surgery in the interim as he abdominal CT we ordered showed a lung nodule and its follow-up PET scan showed hypermetabolic activity in the biopsy did not show metastatic disease, however bronchial washings showed abnormal cells. Is being seen by oncology and has a repeat visit with them in 2 weeks for further plan and evaluation.  Today she states her abdominal pain has returned. Is taking PPI (Prilosec) daily. Takes Carafate prn which works well for her. Takes Carafate every 2-3 days. Pain is burning and epigastric. Denies N/V. Denies hematochezia and melena. Denies any other upper or lower GI symptoms.   Past Medical History  Diagnosis Date  . Hypertension   . Breast cancer     2001 RT breat lumpectomy rad tx  . GERD (gastroesophageal reflux disease)   . Memory loss   . Pure hypercholesterolemia   . Memory change 10/24/2013  . 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 Pulmonary nodule 01/09/2015  . Headache     years ago, none since hysterectomy  . Anemia     in the past  . Diarrhea   . Complication of anesthesia     allergic reaction to Morphine    Past Surgical History  Procedure Laterality Date  . Abdominal hysterectomy    . Bilateral oophorectomy  2007  . Incision and drainage of wound  2011    buttocks  . Tonsillectomy    . Cataract extraction Bilateral     lens implant  . Carpal tunnel release Right   . Colonoscopy  2011    RMR: 1. Anal papilla, otherwise normal rectum.  2. Left-sided diverticula, single diminutive polyp in the sigmoid segment status post cold biospy removal. Remainder of the colonic mucosa appeared normal.   . Colonoscopy N/A 01/14/2015    Procedure: COLONOSCOPY;  Surgeon: Daneil Dolin, MD;  [facility]: [facility];  Service: Endoscopy;  Laterality: N/A;  930 - moved to 10:15  . Esophagogastroduodenoscopy N/A 01/14/2015    Procedure: ESOPHAGOGASTRODUODENOSCOPY (EGD);  Surgeon: Daneil Dolin, MD;  Location: [facility];  Service: Endoscopy;  Laterality: N/A;  . Breast surgery      right lumpectomy, lymph glands remove  . Video bronchoscopy with endobronchial ultrasound N/A 01/19/2015    Procedure: VIDEO BRONCHOSCOPY WITH ENDOBRONCHIAL ULTRASOUND;  Surgeon: Ivin Poot, MD;  Location: [facility] OR;  Service: Thoracic;  Laterality: N/A;    Current Outpatient Prescriptions  Medication Sig Dispense Refill  . aspirin EC 81 MG tablet Take 81 mg by mouth daily.    . cholecalciferol (VITAMIN D) 1000 UNITS tablet Take 1,000 Units by mouth at bedtime.    . donepezil (ARICEPT) 10 MG tablet TAKE 1 TABLET BY MOUTH EVERY NIGHT AT BEDTIME 90 tablet 1  . losartan-hydrochlorothiazide (HYZAAR) 50-12.5 MG per tablet Take 1 tablet by mouth daily.     . naproxen sodium (ANAPROX) 220 MG tablet Take 220 mg by mouth 2 (two) times daily as needed (pain). ALEVE    . sucralfate (CARAFATE) 1 G tablet  Take 1 g by mouth 2 (two) times daily.     . polyethylene glycol-electrolytes (TRILYTE) 420 G solution Take 4,000 mLs by mouth as directed. (Patient not taking: Reported on 01/15/2015) 4000 mL 0   No current facility-administered medications for this visit.    Allergies as of 02/08/2015 - Review Complete 02/08/2015  Allergen Reaction Noted  . Codeine Nausea And Vomiting 11/20/2011  . Morphine and related Itching 11/20/2011  . Sulfur Rash 11/20/2011  . Tamoxifen Other (See Comments) 11/20/2011    Family History  Problem Relation Age of Onset  . Stroke Mother   .  Breast cancer Maternal Aunt   . Breast cancer Maternal Grandmother   . Heart attack Father   . Pancreatic cancer Brother     History   Social History  . Marital Status: Married    Spouse Name: N/A  . Number of Children: 3  . Years of Education: 12   Occupational History  . Retired    Social History Main Topics  . Smoking status: Former Smoker -- 1.00 packs/day for 20 years    Types: Cigarettes    Quit date: 11/20/1999  . Smokeless tobacco: Never Used  . Alcohol Use: Yes     Comment: occasional wine  . Drug Use: No  . Sexual Activity: Not on file   Other Topics Concern  . None   Social History Narrative    Review of Systems: General: Negative for anorexia, weight loss, fever, chills, fatigue, weakness. Eyes: Negative for vision changes.  ENT: Negative for hoarseness, difficulty swallowing. CV: Negative for chest pain, angina, palpitations, peripheral edema.  Respiratory: Negative for dyspnea at rest, cough, wheezing.  GI: See history of present illness. Neuro: Negative for weakness, memory loss, confusion.  Psych: Negative for anxiety, depression.  Endo: Negative for unusual weight change.  Heme: Negative for bruising or bleeding.   Physical Exam: BP 134/75 mmHg  Pulse 68  Temp(Src) 97 F (36.1 C)  Ht '5\' 3"'$  (1.6 m)  Wt 169 lb 3.2 oz (76.749 kg)  BMI 29.98 kg/m2 General:   Alert and oriented. Pleasant and cooperative. Well-nourished and well-developed.  Head:  Normocephalic and atraumatic. Eyes:  Without icterus, sclera clear and conjunctiva pink.  Ears:  Normal auditory acuity. Mouth:  No deformity or lesions, oral mucosa pink. No OP edema. Throat/Neck:  Supple, without mass or thyromegaly. No edema. Cardiovascular:  S1, S2 present without murmurs appreciated. Extremities without clubbing or edema. Respiratory:  Clear to auscultation bilaterally. No wheezes, rales, or rhonchi. No distress.  Gastrointestinal:  +BS, soft, and non-distended. Mild TTP  epigastric area. No HSM noted. No guarding or rebound. No masses appreciated.  Rectal:  Deferred  Neurologic:  Alert and oriented x4;  grossly normal neurologically. Psych:  Alert and cooperative. Normal mood and affect. Heme/Lymph/Immune: No significant cervical adenopathy. No excessive bruising noted.    02/08/2015 11:17 AM

## 2015-02-11 NOTE — Op Note (Signed)
[facility] [address] [place], [address]   ENDOSCOPY PROCEDURE REPORT  PATIENT: Kari, Bullock  MR#: [id number] BIRTHDATE: 1941-07-17 , 73  yrs. old GENDER: female ENDOSCOPIST: R.  Garfield Cornea, MD FACP FACG REFERRED BY:  Ancil Linsey, MD Marjean Donna, M.D. PROCEDURE DATE:  02-22-15 PROCEDURE:  EGD w/ biopsy INDICATIONS:  Epigastric pain. MEDICATIONS: Versed 4 mg IV and Demerol 75 mg IV in divided doses. Xylocaine gel orally.  Zofran 4 mg IV ASA CLASS:      Class III  CONSENT: The risks, benefits, limitations, alternatives and imponderables have been discussed.  The potential for biopsy, esophogeal dilation, etc. have also been reviewed.  Questions have been answered.  All parties agreeable.  Please see the history and physical in the medical record for more information.  DESCRIPTION OF PROCEDURE: After the risks benefits and alternatives of the procedure were thoroughly explained, informed consent was obtained.  The [id number] ([id number]) endoscope was introduced through the mouth and advanced to the second portion of the duodenum , limited by Without limitations. The instrument was slowly withdrawn as the mucosa was fully examined.    Normal appearing tubular esophagus.  Stomach empty.  Examination of the gastric mucosa revealed some erythema and focal erosion overlying polypoid mucosa in the antrum/prepyloric mucosa.  No ulcer or infiltrating process observed.  Pylorus patent. Examination of the first and second portion of the duodenum revealed no abnormalities.  Biopsies of the abnormal gastric mucosa taken for histologic study. Retroflexed views revealed as previously described.     The scope was then withdrawn from the patient and the procedure completed.  COMPLICATIONS: There were no immediate complications.  ENDOSCOPIC IMPRESSION: Focally abnormal antrum of doubtful clinical significance - status post  biopsy  RECOMMENDATIONS: Although some improvement with twice a day PPI therapy in the way of Prilosec, will have her hold Prilosec and give for a brief trial of Dexilant 60 mg daily. Follow up on pathology.  REPEAT EXAM:  eSigned:  R. Garfield Cornea, MD [facility] 02/22/15 11:08 AM    CC:  CPT CODES: ICD CODES:  The ICD and CPT codes recommended by this software are interpretations from the data that the clinical staff has captured with the software.  The verification of the translation of this report to the ICD and CPT codes and modifiers is the sole responsibility of the health care institution and practicing physician where this report was generated.  [employer] will not be held responsible for the validity of the ICD and CPT codes included on this report.  AMA assumes no liability for data contained or not contained herein. CPT is a Designer, television/film set of the [employer].

## 2015-02-16 ENCOUNTER — Encounter (HOSPITAL_COMMUNITY): Payer: Self-pay | Admitting: Internal Medicine

## 2015-02-17 ENCOUNTER — Encounter: Payer: Self-pay | Admitting: Internal Medicine

## 2015-02-18 ENCOUNTER — Other Ambulatory Visit: Payer: Self-pay | Admitting: *Deleted

## 2015-02-18 ENCOUNTER — Ambulatory Visit (INDEPENDENT_AMBULATORY_CARE_PROVIDER_SITE_OTHER): Payer: Medicare HMO | Admitting: Cardiothoracic Surgery

## 2015-02-18 ENCOUNTER — Encounter: Payer: Self-pay | Admitting: Cardiothoracic Surgery

## 2015-02-18 ENCOUNTER — Other Ambulatory Visit: Payer: Self-pay | Admitting: Cardiothoracic Surgery

## 2015-02-18 VITALS — BP 139/65 | HR 63 | Resp 20 | Ht 63.0 in | Wt 168.0 lb

## 2015-02-18 DIAGNOSIS — R519 Headache, unspecified: Secondary | ICD-10-CM

## 2015-02-18 DIAGNOSIS — Z9889 Other specified postprocedural states: Secondary | ICD-10-CM

## 2015-02-18 DIAGNOSIS — C50911 Malignant neoplasm of unspecified site of right female breast: Secondary | ICD-10-CM | POA: Diagnosis not present

## 2015-02-18 DIAGNOSIS — R911 Solitary pulmonary nodule: Secondary | ICD-10-CM

## 2015-02-18 DIAGNOSIS — R51 Headache: Secondary | ICD-10-CM

## 2015-02-18 NOTE — Progress Notes (Addendum)
PCP is [name], MD Referring Provider is Sheldon Silvan Manon Hilding, PA-C  Chief Complaint  Patient presents with  . Routine Post Op    s/p EBUS 01/19/15, discuss results    HPI:the patient returns for further followup discussion of her recently discovered bilateral pulmonary nodules, hypermetabolic subcarinal node and  results of her bronchoscopy and transbronchial ultrasound biopsy of subcarinal node.  In summary she has had several CT scans of the abdomen for functional upper and lower bowel disease. These have demonstrated a slowly enlarging left lower lobe nodule now 1.7 cm in diameter and a small 7-8 mm right middle lobe nodule. A PET scan showed the left lower lobe nodule to be cold and the right middle lobe nodule to be hypermetabolic with a standard uptake value of 4.2 the subcarinal node was hypermetabolic as well.  The patient underwent recent endoscopy by her gastroenterologist Dr. Sydell Axon who found a gastric polyp which was biopsied and was benign. No esophageal pathology was noted.  Brushings of the right middle lobe showed atypical cells not diagnostic of malignancy. Transbronchial biopsy of the subcarinal node showed lymphoid tissue-repairative changes. The patient is pending head CT scan to complete clinical staging.    The patient had adenocarcinoma right breast and received surgery and axillary node dissection followed by radiation therapy to the right breast in 2001-2002. She had positive nodes. She does have a past smoking history.  With bilateral pulmonary nodules the right side disease seems to be more concerning for primary lung cancer or malignancy. However before proceeding with therapy I would plan on ruling out malignancy of the left nodule with a transthoracic needle biopsy by interventional radiology.  The patient appears to be more interested in nonsurgical therapy if she is diagnosed with cancer. Transthoracic needle biopsy of the right middle lobe hypermetabolic  nodule would be with low diagnostic value currently at such a small size. If the left lower lobe nodule is benign/slowly growing hamartoma than I would probably recommend followup CT scan with probable transthoracic biopsy of the right middle lobe nodule and 2-3 months.stereotactic radiation therapy would probably be beneficial for this elderly patient.  If the needle biopsy of the  lung is positive for cancer then her therapy would be directed by her oncologist depending on cell type. Past Medical History  Diagnosis Date  . Hypertension   . Breast cancer     2001 RT breat lumpectomy rad tx  . GERD (gastroesophageal reflux disease)   . Memory loss   . Pure hypercholesterolemia   . Memory change 10/24/2013  . 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 Pulmonary nodule 01/09/2015  . Headache     years ago, none since hysterectomy  . Anemia     in the past  . Diarrhea   . Complication of anesthesia     allergic reaction to Morphine    Past Surgical History  Procedure Laterality Date  . Abdominal hysterectomy    . Bilateral oophorectomy  2007  . Incision and drainage of wound  2011    buttocks  . Tonsillectomy    . Cataract extraction Bilateral     lens implant  . Carpal tunnel release Right   . Colonoscopy  2011    RMR: 1. Anal papilla, otherwise normal rectum. 2. Left-sided diverticula, single diminutive polyp in the sigmoid segment status post cold biospy removal. Remainder of the colonic mucosa appeared normal.   . Colonoscopy N/A 01/14/2015    Procedure: COLONOSCOPY;  Surgeon:  Daneil Dolin, MD;  [facility]: [facility];  Service: Endoscopy;  Laterality: N/A;  930 - moved to 10:15  . Esophagogastroduodenoscopy N/A 01/14/2015    Procedure: ESOPHAGOGASTRODUODENOSCOPY (EGD);  Surgeon: Daneil Dolin, MD;  Location: [facility];  Service: Endoscopy;  Laterality: N/A;  . Breast surgery      right lumpectomy, lymph glands remove  . Video bronchoscopy with  endobronchial ultrasound N/A 01/19/2015    Procedure: VIDEO BRONCHOSCOPY WITH ENDOBRONCHIAL ULTRASOUND;  Surgeon: Ivin Poot, MD;  Location: [place];  Service: Thoracic;  Laterality: N/A;  . Esophagogastroduodenoscopy N/A 02/11/2015    Procedure: ESOPHAGOGASTRODUODENOSCOPY (EGD);  Surgeon: Daneil Dolin, MD;  [facility]: [facility];  Service: Endoscopy;  Laterality: N/A;  1200    Family History  Problem Relation Age of Onset  . Stroke Mother   . Breast cancer Maternal Aunt   . Breast cancer Maternal Grandmother   . Heart attack Father   . Pancreatic cancer Brother     Social History History  Substance Use Topics  . Smoking status: Former Smoker -- 1.00 packs/day for 20 years    Types: Cigarettes    Quit date: 11/20/1999  . Smokeless tobacco: Never Used  . Alcohol Use: Yes     Comment: occasional wine    Current Outpatient Prescriptions  Medication Sig Dispense Refill  . aspirin EC 81 MG tablet Take 81 mg by mouth daily.    . cholecalciferol (VITAMIN D) 1000 UNITS tablet Take 1,000 Units by mouth at bedtime.    . donepezil (ARICEPT) 10 MG tablet TAKE 1 TABLET BY MOUTH EVERY NIGHT AT BEDTIME 90 tablet 1  . losartan-hydrochlorothiazide (HYZAAR) 50-12.5 MG per tablet Take 1 tablet by mouth daily.     . naproxen sodium (ANAPROX) 220 MG tablet Take 220 mg by mouth 2 (two) times daily as needed (pain). ALEVE    . omeprazole (PRILOSEC) 20 MG capsule Take 1 capsule (20 mg total) by mouth 2 (two) times daily before a meal. 60 capsule 2  . polyethylene glycol-electrolytes (TRILYTE) 420 G solution Take 4,000 mLs by mouth as directed. 4000 mL 0  . sucralfate (CARAFATE) 1 G tablet Take 1 g by mouth daily as needed (stomach acid).      No current facility-administered medications for this visit.    Allergies  Allergen Reactions  . Codeine Nausea And Vomiting  . Morphine And Related Itching  . Sulfur Rash  . Tamoxifen Other (See Comments)    Blurred vision and dizziness    Review  of Systems  Her weight has stabilized She denies cough or pain  BP 139/65 mmHg  Pulse 63  Resp 20  Ht '5\' 3"'$  (1.6 m)  Wt 168 lb (76.204 kg)  BMI 29.77 kg/m2  SpO2 97% Physical Exam Alert and comfortable Lungs clear Heart rate regular No cervical adenopathy palpable No edema  Diagnostic Tests: Results of arthroscopy and biopsies again reviewed with patient Patient will be scheduled for head CT scan to complete clinical staging of possible lung cancer. Impression: Bilateral pulmonary nodules  Plan:return for discussion after left lower lobe nodule was biopsied by interventional radiology.   Len Childs, MD [facility] [phone]     Follow-up note to document phone call to patient 03/19/2015  Transthoracic needle biopsy of left lower lobe lung nodule negative for malignancy showing non-caseating granuloma with stains negative for AFB and fungal. No complications of biopsy.  Brain MRI completed July 1 showing  no evidence of metastatic disease.  I told the patient that since the biopsies of the mediastinal lymph nodes showed inflammatory changes and the biopsy of the left lower lobe slowly enlarging nodule was inflammatory as well that I would not recommend surgical resection of the small right middle lobe nodule, which is too small to biopsy at this time.  We will plan on repeating the chest CT scan with contrast in early October. If the right middle lobe nodule continues to enlarge then a transthoracic CT-guided needle biopsy would be performed  for possible stereotactic radiation therapy--the patient is not interested in surgical resection  At her age.

## 2015-02-22 ENCOUNTER — Ambulatory Visit (HOSPITAL_COMMUNITY): Payer: Medicare HMO | Admitting: Hematology & Oncology

## 2015-02-22 ENCOUNTER — Other Ambulatory Visit: Payer: Medicare HMO

## 2015-02-24 ENCOUNTER — Other Ambulatory Visit: Payer: Self-pay | Admitting: *Deleted

## 2015-02-24 DIAGNOSIS — R519 Headache, unspecified: Secondary | ICD-10-CM

## 2015-02-24 DIAGNOSIS — R911 Solitary pulmonary nodule: Secondary | ICD-10-CM

## 2015-02-24 DIAGNOSIS — R51 Headache: Secondary | ICD-10-CM

## 2015-02-25 ENCOUNTER — Encounter (HOSPITAL_COMMUNITY): Payer: Self-pay | Admitting: Hematology & Oncology

## 2015-02-25 ENCOUNTER — Encounter (HOSPITAL_COMMUNITY): Payer: Medicare HMO | Attending: Oncology | Admitting: Hematology & Oncology

## 2015-02-25 DIAGNOSIS — R911 Solitary pulmonary nodule: Secondary | ICD-10-CM | POA: Insufficient documentation

## 2015-02-25 DIAGNOSIS — C50911 Malignant neoplasm of unspecified site of right female breast: Secondary | ICD-10-CM | POA: Insufficient documentation

## 2015-03-01 ENCOUNTER — Ambulatory Visit: Payer: Medicare HMO | Admitting: Gastroenterology

## 2015-03-01 NOTE — Progress Notes (Signed)
This encounter was created in error - please disregard.

## 2015-03-02 ENCOUNTER — Other Ambulatory Visit: Payer: Self-pay | Admitting: Radiology

## 2015-03-03 ENCOUNTER — Ambulatory Visit (HOSPITAL_COMMUNITY)
Admission: RE | Admit: 2015-03-03 | Discharge: 2015-03-03 | Disposition: A | Discharge status: Home / Self Care | Payer: Medicare HMO | Source: Ambulatory Visit | Attending: Cardiothoracic Surgery | Admitting: Cardiothoracic Surgery

## 2015-03-03 ENCOUNTER — Encounter (HOSPITAL_COMMUNITY): Payer: Self-pay

## 2015-03-03 ENCOUNTER — Ambulatory Visit (HOSPITAL_COMMUNITY)
Admission: RE | Admit: 2015-03-03 | Discharge: 2015-03-03 | Disposition: A | Discharge status: Home / Self Care | Payer: Medicare HMO | Source: Ambulatory Visit | Attending: Interventional Radiology | Admitting: Interventional Radiology

## 2015-03-03 DIAGNOSIS — Z87891 Personal history of nicotine dependence: Secondary | ICD-10-CM | POA: Insufficient documentation

## 2015-03-03 DIAGNOSIS — Z79899 Other long term (current) drug therapy: Secondary | ICD-10-CM | POA: Insufficient documentation

## 2015-03-03 DIAGNOSIS — Z885 Allergy status to narcotic agent status: Secondary | ICD-10-CM | POA: Insufficient documentation

## 2015-03-03 DIAGNOSIS — E780 Pure hypercholesterolemia: Secondary | ICD-10-CM | POA: Insufficient documentation

## 2015-03-03 DIAGNOSIS — Z923 Personal history of irradiation: Secondary | ICD-10-CM | POA: Insufficient documentation

## 2015-03-03 DIAGNOSIS — R51 Headache: Secondary | ICD-10-CM

## 2015-03-03 DIAGNOSIS — R413 Other amnesia: Secondary | ICD-10-CM | POA: Insufficient documentation

## 2015-03-03 DIAGNOSIS — K219 Gastro-esophageal reflux disease without esophagitis: Secondary | ICD-10-CM | POA: Diagnosis not present

## 2015-03-03 DIAGNOSIS — Z853 Personal history of malignant neoplasm of breast: Secondary | ICD-10-CM | POA: Insufficient documentation

## 2015-03-03 DIAGNOSIS — R519 Headache, unspecified: Secondary | ICD-10-CM

## 2015-03-03 DIAGNOSIS — R918 Other nonspecific abnormal finding of lung field: Secondary | ICD-10-CM | POA: Insufficient documentation

## 2015-03-03 DIAGNOSIS — Z800 Family history of malignant neoplasm of digestive organs: Secondary | ICD-10-CM | POA: Insufficient documentation

## 2015-03-03 DIAGNOSIS — I10 Essential (primary) hypertension: Secondary | ICD-10-CM | POA: Insufficient documentation

## 2015-03-03 DIAGNOSIS — Z803 Family history of malignant neoplasm of breast: Secondary | ICD-10-CM | POA: Insufficient documentation

## 2015-03-03 DIAGNOSIS — R911 Solitary pulmonary nodule: Secondary | ICD-10-CM

## 2015-03-03 LAB — CBC
HCT: 43.6 % (ref 36.0–46.0)
Hemoglobin: 14.1 g/dL (ref 12.0–15.0)
MCH: 27.8 pg (ref 26.0–34.0)
MCHC: 32.3 g/dL (ref 30.0–36.0)
MCV: 86 fL (ref 78.0–100.0)
Platelets: 181 10*3/uL (ref 150–400)
RBC: 5.07 MIL/uL (ref 3.87–5.11)
RDW: 13.6 % (ref 11.5–15.5)
WBC: 5.5 10*3/uL (ref 4.0–10.5)

## 2015-03-03 LAB — APTT: APTT: 29 s (ref 24–37)

## 2015-03-03 LAB — PROTIME-INR
INR: 1.06 (ref 0.00–1.49)
Prothrombin Time: 14 seconds (ref 11.6–15.2)

## 2015-03-03 MED ORDER — FENTANYL CITRATE (PF) 100 MCG/2ML IJ SOLN
INTRAMUSCULAR | Status: AC | PRN
  Administered 2015-03-03: 25 ug via INTRAVENOUS

## 2015-03-03 MED ORDER — MIDAZOLAM HCL 2 MG/2ML IJ SOLN
INTRAMUSCULAR | Status: AC | PRN
  Administered 2015-03-03: 0.5 mg via INTRAVENOUS

## 2015-03-03 MED ORDER — FENTANYL CITRATE (PF) 100 MCG/2ML IJ SOLN
INTRAMUSCULAR | Status: AC
  Filled 2015-03-03: qty 2

## 2015-03-03 MED ORDER — SODIUM CHLORIDE 0.9 % IV SOLN
Freq: Once | INTRAVENOUS | Status: AC
  Administered 2015-03-03: 08:00:00 via INTRAVENOUS

## 2015-03-03 MED ORDER — MIDAZOLAM HCL 2 MG/2ML IJ SOLN
INTRAMUSCULAR | Status: AC
  Filled 2015-03-03: qty 2

## 2015-03-03 MED ORDER — HYDROCODONE-ACETAMINOPHEN 5-325 MG PO TABS
1.0000 | ORAL_TABLET | ORAL | Status: DC | PRN
  Filled 2015-03-03: qty 2

## 2015-03-03 MED ORDER — LIDOCAINE HCL 1 % IJ SOLN
INTRAMUSCULAR | Status: AC
  Filled 2015-03-03: qty 20

## 2015-03-03 NOTE — Procedures (Signed)
CT core LLL nodule No complication No blood loss. See complete dictation in [facility].

## 2015-03-03 NOTE — H&P (Signed)
Chief Complaint: Pulmonary nodules  Referring Physician(s): Lucianne Lei Trigt,Peter  History of Present Illness: Kari Bullock is a 74 y.o. female    Hx Rt Breast Ca 2001 Surgery and radiation 2001-2002 Pt had developed abd pain 12/2014 and work up included CT Abd Incidental finding of B pulmonary nodules Evaluation has included brushings of R lobe mass: atypical cells                                                             Bronch bx of subcarinal nodes: Lymphoid tissue Has been consulted with Dr Prescott Gum Has requested Bx of LLL mass biopsy for treatment guidelines   Past Medical History  Diagnosis Date  . Hypertension   . Breast cancer     2001 RT breat lumpectomy rad tx  . GERD (gastroesophageal reflux disease)   . Memory loss   . Pure hypercholesterolemia   . Memory change 10/24/2013  . 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 Pulmonary nodule 01/09/2015  . Headache     years ago, none since hysterectomy  . Anemia     in the past  . Diarrhea   . Complication of anesthesia     allergic reaction to Morphine    Past Surgical History  Procedure Laterality Date  . Abdominal hysterectomy    . Bilateral oophorectomy  2007  . Incision and drainage of wound  2011    buttocks  . Tonsillectomy    . Cataract extraction Bilateral     lens implant  . Carpal tunnel release Right   . Colonoscopy  2011    RMR: 1. Anal papilla, otherwise normal rectum. 2. Left-sided diverticula, single diminutive polyp in the sigmoid segment status post cold biospy removal. Remainder of the colonic mucosa appeared normal.   . Colonoscopy N/A 01/14/2015    Procedure: COLONOSCOPY;  Surgeon: Daneil Dolin, MD;  [facility]: [facility];  Service: Endoscopy;  Laterality: N/A;  930 - moved to 10:15  . Esophagogastroduodenoscopy N/A 01/14/2015    Procedure: ESOPHAGOGASTRODUODENOSCOPY (EGD);  Surgeon: Daneil Dolin, MD;  [facility];  Service: Endoscopy;  Laterality:  N/A;  . Breast surgery      right lumpectomy, lymph glands remove  . Video bronchoscopy with endobronchial ultrasound N/A 01/19/2015    Procedure: VIDEO BRONCHOSCOPY WITH ENDOBRONCHIAL ULTRASOUND;  Surgeon: Ivin Poot, MD;  Location: [place];  Service: Thoracic;  Laterality: N/A;  . Esophagogastroduodenoscopy N/A 02/11/2015    Procedure: ESOPHAGOGASTRODUODENOSCOPY (EGD);  Surgeon: Daneil Dolin, MD;  [facility];  Service: Endoscopy;  Laterality: N/A;  1200    Allergies: Codeine; Morphine and related; Adhesive; Sulfur; and Tamoxifen  Medications: Prior to Admission medications   Medication Sig Start Date End Date Taking? Authorizing Provider  cholecalciferol (VITAMIN D) 1000 UNITS tablet Take 1,000 Units by mouth at bedtime.   Yes Historical Provider, MD  dexlansoprazole (DEXILANT) 60 MG capsule Take 60 mg by mouth daily before breakfast.    Yes Historical Provider, MD  donepezil (ARICEPT) 10 MG tablet TAKE 1 TABLET BY MOUTH EVERY NIGHT AT BEDTIME 12/27/14  Yes Kathrynn Ducking, MD  losartan-hydrochlorothiazide (HYZAAR) 50-12.5 MG per tablet Take 1 tablet by mouth daily.  09/30/11  Yes Historical Provider, MD  naproxen sodium (  ANAPROX) 220 MG tablet Take 220 mg by mouth 2 (two) times daily as needed (pain). ALEVE   Yes Historical Provider, MD  aspirin EC 81 MG tablet Take 81 mg by mouth daily.    Historical Provider, MD  omeprazole (PRILOSEC) 20 MG capsule Take 1 capsule (20 mg total) by mouth 2 (two) times daily before a meal. Patient not taking: Reported on 02/25/2015 02/08/15   Carlis Stable, NP     Family History  Problem Relation Age of Onset  . Stroke Mother   . Breast cancer Maternal Aunt   . Breast cancer Maternal Grandmother   . Heart attack Father   . Pancreatic cancer Brother     History   Social History  . Marital Status: Married    Spouse Name: N/A  . Number of Children: 3  . Years of Education: 12   Occupational History  . Retired    Social History  Main Topics  . Smoking status: Former Smoker -- 1.00 packs/day for 20 years    Types: Cigarettes    Quit date: 11/20/1999  . Smokeless tobacco: Never Used  . Alcohol Use: Yes     Comment: occasional wine  . Drug Use: No  . Sexual Activity: Not on file   Other Topics Concern  . None   Social History Narrative     Review of Systems: A 12 point ROS discussed and pertinent positives are indicated in the HPI above.  All other systems are negative.  Review of Systems  Constitutional: Positive for unexpected weight change. Negative for activity change and appetite change.  Respiratory: Negative for cough and shortness of breath.   Gastrointestinal: Positive for abdominal pain.  Musculoskeletal: Negative for back pain.  Neurological: Negative for weakness.  Psychiatric/Behavioral: Negative for behavioral problems and confusion.    Vital Signs: BP 140/86 mmHg  Pulse 65  Temp(Src) 97.8 F (36.6 C) (Oral)  Resp 17  Ht '5\' 3"'$  (1.6 m)  Wt 163 lb (73.936 kg)  BMI 28.88 kg/m2  SpO2 98%  Physical Exam  Constitutional: She is oriented to person, place, and time. She appears well-nourished.  Cardiovascular: Normal rate, regular rhythm and normal heart sounds.   No murmur heard. Pulmonary/Chest: Effort normal and breath sounds normal. She has no wheezes.  Abdominal: Soft. Bowel sounds are normal. There is no tenderness.  Musculoskeletal: Normal range of motion.  Neurological: She is alert and oriented to person, place, and time.  Skin: Skin is warm and dry.  Psychiatric: She has a normal mood and affect. Her behavior is normal. Judgment and thought content normal.  Nursing note and vitals reviewed.   Mallampati Score:  MD Evaluation Airway: WNL Heart: WNL Abdomen: WNL Chest/ Lungs: WNL ASA  Classification: 3 Mallampati/Airway Score: One  Imaging: No results found.  Labs:  CBC:  Recent Labs  09/01/14 1049 01/11/15 1145 01/19/15 0817 03/03/15 0752  WBC 6.2 7.0 5.5  5.5  HGB 13.1 14.2 13.8 14.1  HCT 40.3 43.8 43.0 43.6  PLT 202 206 182 181    COAGS:  Recent Labs  01/19/15 0817  INR 1.04  APTT 35    BMP:  Recent Labs  09/01/14 1049 12/16/14 1058 01/11/15 1145 01/19/15 0817  NA 138 140 138 139  K 3.9 4.0 3.7 4.0  CL 99 100 102 102  CO2 '25 28 28 24  '$ GLUCOSE 107* 112* 83 108*  BUN '14 14 21 13  '$ CALCIUM 10.5 10.6* 10.5 10.5*  CREATININE 0.64 0.69 0.80  0.71  GFRNONAA 86*  --  70* >60  GFRAA >90  --  82* >60    LIVER FUNCTION TESTS:  Recent Labs  12/16/14 1058 01/11/15 1145 01/19/15 0817  BILITOT 0.5 0.6 1.1  AST '19 25 22  '$ ALT '15 21 18  '$ ALKPHOS 65 61 60  PROT 6.5 7.2 7.0  ALBUMIN 4.0 4.4 4.0    TUMOR MARKERS: No results for input(s): AFPTM, CEA, CA199, CHROMGRNA in the last 8760 hours.  Assessment and Plan:  Hx R Breast Ca 2001 New findings B lung nodules Bronch brushing: R nodule - atypical                                 Subcarinal node: lymphoid Request now for LLL mass bx Risks and Benefits discussed with the patient including, but not limited to bleeding, hemoptysis, respiratory failure requiring intubation, infection, pneumothorax requiring chest tube placement, stroke from air embolism or even death. All of the patient's questions were answered, patient is agreeable to proceed. Consent signed and in chart.    Thank you for this interesting consult.  I greatly enjoyed meeting JONESHA TSUCHIYA and look forward to participating in their care.  Signed: Kapena Hamme A 03/03/2015, 8:23 AM   I spent a total of  20 Minutes   in face to face in clinical consultation, greater than 50% of which was counseling/coordinating care for L lung nodule bx

## 2015-03-03 NOTE — Discharge Instructions (Signed)
Needle Biopsy of Lung, Care After °Refer to this sheet in the next few weeks. These instructions provide you with information on caring for yourself after your procedure. Your health care provider may also give you more specific instructions. Your treatment has been planned according to current medical practices, but problems sometimes occur. Call your health care provider if you have any problems or questions after your procedure. °WHAT TO EXPECT AFTER THE PROCEDURE °· A bandage will be applied over the area where the needle was inserted. You may be asked to apply pressure to the bandage for several minutes to ensure there is minimal bleeding. °· In most cases, you can leave when your needle biopsy procedure is completed. Do not drive yourself home. Someone else should take you home. °· If you received an IV sedative or general anesthetic, you will be taken to a comfortable place to relax while the medicine wears off. °· If you have upcoming travel scheduled, talk to your health care provider about when it is safe to travel by air after the procedure. °HOME CARE INSTRUCTIONS °· Expect to take it easy for the rest of the day. °· Protect the area where you received the needle biopsy by keeping the bandage in place for as long as instructed. °· You may feel some mild pain or discomfort in the area, but this should stop in a day or two. °· Take medicines only as directed by your health care provider. °SEEK MEDICAL CARE IF:  °· You have pain at the biopsy site that worsens or is not helped by medicine. °· You have swelling or drainage at the needle biopsy site. °· You have a fever. °SEEK IMMEDIATE MEDICAL CARE IF:  °· You have new or worsening shortness of breath. °· You have chest pain. °· You are coughing up blood. °· You have bleeding that does not stop with pressure or a bandage. °· You develop light-headedness or fainting. °Document Released: 07/02/2007 Document Revised: 01/19/2014 Document Reviewed:  01/27/2013 °[employer]® Patient Information ©2015 [employer]. This information is not intended to replace advice given to you by your health care provider. Make sure you discuss any questions you have with your health care provider. ° °

## 2015-03-03 NOTE — Progress Notes (Signed)
Pt to radiology for CXR s/p lung bx.

## 2015-03-05 ENCOUNTER — Ambulatory Visit (HOSPITAL_COMMUNITY)
Admission: RE | Admit: 2015-03-05 | Discharge: 2015-03-05 | Disposition: A | Discharge status: Home / Self Care | Payer: Medicare HMO | Source: Ambulatory Visit | Attending: Cardiothoracic Surgery | Admitting: Cardiothoracic Surgery

## 2015-03-05 ENCOUNTER — Other Ambulatory Visit: Payer: Self-pay | Admitting: Cardiothoracic Surgery

## 2015-03-05 DIAGNOSIS — R519 Headache, unspecified: Secondary | ICD-10-CM

## 2015-03-05 DIAGNOSIS — R911 Solitary pulmonary nodule: Secondary | ICD-10-CM

## 2015-03-05 DIAGNOSIS — R51 Headache: Secondary | ICD-10-CM

## 2015-03-08 ENCOUNTER — Other Ambulatory Visit: Payer: Self-pay | Admitting: *Deleted

## 2015-03-10 ENCOUNTER — Ambulatory Visit: Payer: Medicare HMO | Admitting: Cardiothoracic Surgery

## 2015-03-11 ENCOUNTER — Ambulatory Visit (HOSPITAL_COMMUNITY): Payer: Medicare HMO | Admitting: Hematology & Oncology

## 2015-03-19 ENCOUNTER — Ambulatory Visit (HOSPITAL_COMMUNITY)
Admission: RE | Admit: 2015-03-19 | Discharge: 2015-03-19 | Disposition: A | Discharge status: Home / Self Care | Payer: Medicare HMO | Source: Ambulatory Visit | Attending: Cardiothoracic Surgery | Admitting: Cardiothoracic Surgery

## 2015-03-19 ENCOUNTER — Other Ambulatory Visit: Payer: Self-pay | Admitting: Cardiothoracic Surgery

## 2015-03-19 DIAGNOSIS — R519 Headache, unspecified: Secondary | ICD-10-CM

## 2015-03-19 DIAGNOSIS — R918 Other nonspecific abnormal finding of lung field: Secondary | ICD-10-CM | POA: Insufficient documentation

## 2015-03-19 DIAGNOSIS — R51 Headache: Secondary | ICD-10-CM

## 2015-03-19 DIAGNOSIS — R911 Solitary pulmonary nodule: Secondary | ICD-10-CM

## 2015-03-19 DIAGNOSIS — Z853 Personal history of malignant neoplasm of breast: Secondary | ICD-10-CM | POA: Insufficient documentation

## 2015-03-19 LAB — POCT I-STAT CREATININE: Creatinine, Ser: 0.8 mg/dL (ref 0.44–1.00)

## 2015-03-19 MED ORDER — SODIUM CHLORIDE 0.9 % IJ SOLN
INTRAMUSCULAR | Status: AC
  Filled 2015-03-19: qty 9

## 2015-03-19 MED ORDER — GADOBENATE DIMEGLUMINE 529 MG/ML IV SOLN
15.0000 mL | Freq: Once | INTRAVENOUS | Status: AC | PRN
  Administered 2015-03-19: 15 mL via INTRAVENOUS

## 2015-03-23 ENCOUNTER — Telehealth: Payer: Self-pay | Admitting: *Deleted

## 2015-03-24 ENCOUNTER — Ambulatory Visit: Payer: Medicare HMO | Admitting: Cardiothoracic Surgery

## 2015-03-29 ENCOUNTER — Ambulatory Visit (HOSPITAL_COMMUNITY): Payer: Medicare HMO | Admitting: Hematology & Oncology

## 2015-05-03 ENCOUNTER — Other Ambulatory Visit: Payer: Self-pay | Admitting: Nurse Practitioner

## 2015-05-05 ENCOUNTER — Other Ambulatory Visit: Payer: Self-pay

## 2015-05-11 ENCOUNTER — Ambulatory Visit: Payer: Medicare HMO | Admitting: Nurse Practitioner

## 2015-06-22 ENCOUNTER — Other Ambulatory Visit: Payer: Self-pay | Admitting: Neurology

## 2015-06-25 ENCOUNTER — Other Ambulatory Visit: Payer: Self-pay | Admitting: *Deleted

## 2015-06-25 DIAGNOSIS — R911 Solitary pulmonary nodule: Secondary | ICD-10-CM

## 2015-06-28 ENCOUNTER — Encounter: Payer: Self-pay | Admitting: Adult Health

## 2015-06-28 ENCOUNTER — Ambulatory Visit (INDEPENDENT_AMBULATORY_CARE_PROVIDER_SITE_OTHER): Payer: Medicare HMO | Admitting: Adult Health

## 2015-06-28 ENCOUNTER — Other Ambulatory Visit: Payer: Self-pay | Admitting: Cardiothoracic Surgery

## 2015-06-28 VITALS — BP 159/71 | HR 63 | Ht 63.0 in | Wt 167.0 lb

## 2015-06-28 DIAGNOSIS — R413 Other amnesia: Secondary | ICD-10-CM

## 2015-06-28 NOTE — Progress Notes (Signed)
PATIENT: Kari Bullock DOB: 1941/03/01  REASON FOR VISIT: follow up- memory HISTORY FROM: patient  HISTORY OF PRESENT ILLNESS: Kari Bullock is a 74 year old female with a history of mild memory disorder. She returns today for follow-up. The patient continues on Aricept and tolerates the medication well. She denies any significant changes with her memory. She is able to complete all ADLs independently. She lives at home alone. She is able to prepare her own meals. She completes her finances without difficulty. She operates a Teacher, music without difficulty. Denies having to give up anything due to her memory. Patient reports that she does have a runny nose since starting Aricept. She returns today for an evaluation.  HISTORY 10/27/2014 Kari Bullock): Kari Bullock is a 74 year old right-handed white female with a history of a mild memory disorder. The patient has done fairly well over the last 6 months, she denies any significant changes in her memory. She has lost some weight, and she is sleeping better at night, she feels as if her memory and cognitive capacity has improved. The patient is on Aricept, she is tolerating medication well. She has not had any significant medical issues that have come up since last seen. The patient returns to this office for an evaluation. She no longer requires Benadryl at night for sleep.  REVIEW OF SYSTEMS: Out of a complete 14 system review of symptoms, the patient complains only of the following symptoms, and all other reviewed systems are negative.  See history of present illness  ALLERGIES: Allergies  Allergen Reactions  . Codeine Nausea And Vomiting  . Morphine And Related Itching  . Adhesive [Tape] Rash    Please use paper tape  . Sulfur Rash  . Tamoxifen Other (See Comments)    Blurred vision and dizziness    HOME MEDICATIONS: Outpatient Prescriptions Prior to Visit  Medication Sig Dispense Refill  . aspirin EC 81 MG tablet Take 81 mg by mouth  daily.    . cholecalciferol (VITAMIN D) 1000 UNITS tablet Take 1,000 Units by mouth at bedtime.    Marland Kitchen dexlansoprazole (DEXILANT) 60 MG capsule Take 60 mg by mouth daily before breakfast.     . donepezil (ARICEPT) 10 MG tablet TAKE 1 TABLET BY MOUTH EVERY NIGHT AT BEDTIME 90 tablet 1  . losartan-hydrochlorothiazide (HYZAAR) 50-12.5 MG per tablet Take 1 tablet by mouth daily.     . naproxen sodium (ANAPROX) 220 MG tablet Take 220 mg by mouth 2 (two) times daily as needed (pain). ALEVE    . omeprazole (PRILOSEC) 20 MG capsule TAKE 1 CAPSULE(20 MG) BY MOUTH TWICE DAILY BEFORE A MEAL 60 capsule 5  . donepezil (ARICEPT) 10 MG tablet TAKE 1 TABLET BY MOUTH EVERY NIGHT AT BEDTIME 90 tablet 0   No facility-administered medications prior to visit.    PAST MEDICAL HISTORY: Past Medical History  Diagnosis Date  . Hypertension   . Breast cancer ([place])     2001 RT breat lumpectomy rad tx  . GERD (gastroesophageal reflux disease)   . Memory loss   . Pure hypercholesterolemia   . Memory change 10/24/2013  . 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 Pulmonary nodule 01/09/2015  . Headache     years ago, none since hysterectomy  . Anemia     in the past  . Diarrhea   . Complication of anesthesia     allergic reaction to Morphine    PAST SURGICAL HISTORY: Past Surgical History  Procedure Laterality Date  .  Abdominal hysterectomy    . Bilateral oophorectomy  2007  . Incision and drainage of wound  2011    buttocks  . Tonsillectomy    . Cataract extraction Bilateral     lens implant  . Carpal tunnel release Right   . Colonoscopy  2011    RMR: 1. Anal papilla, otherwise normal rectum. 2. Left-sided diverticula, single diminutive polyp in the sigmoid segment status post cold biospy removal. Remainder of the colonic mucosa appeared normal.   . Colonoscopy N/A 01/14/2015    Procedure: COLONOSCOPY;  Surgeon: Daneil Dolin, MD;  [facility];  Service: Endoscopy;   Laterality: N/A;  930 - moved to 10:15  . Esophagogastroduodenoscopy N/A 01/14/2015    RMR: Colonic diverticulosis. status post segmental biospy and stool sampling.  No egd done today  . Breast surgery      right lumpectomy, lymph glands remove  . Video bronchoscopy with endobronchial ultrasound N/A 01/19/2015    Procedure: VIDEO BRONCHOSCOPY WITH ENDOBRONCHIAL ULTRASOUND;  Surgeon: Ivin Poot, MD;  Location: [facility] OR;  Service: Thoracic;  Laterality: N/A;  . Esophagogastroduodenoscopy N/A 02/11/2015    RMR: Focally abnormal antrum of doubtful clinical significance- Status post biopsy.     FAMILY HISTORY: Family History  Problem Relation Age of Onset  . Stroke Mother   . Breast cancer Maternal Aunt   . Breast cancer Maternal Grandmother   . Heart attack Father   . Pancreatic cancer Brother     SOCIAL HISTORY: Social History   Social History  . Marital Status: Married    Spouse Name: N/A  . Number of Children: 3  . Years of Education: 12   Occupational History  . Retired    Social History Main Topics  . Smoking status: Former Smoker -- 1.00 packs/day for 20 years    Types: Cigarettes    Quit date: 11/20/1999  . Smokeless tobacco: Never Used  . Alcohol Use: Yes     Comment: occasional wine  . Drug Use: No  . Sexual Activity: Not on file   Other Topics Concern  . Not on file   Social History Narrative      PHYSICAL EXAM  Filed Vitals:   06/28/15 0923  BP: 159/71  Pulse: 63  Height: '5\' 3"'$  (1.6 m)  Weight: 167 lb (75.751 kg)   Body mass index is 29.59 kg/(m^2).  MMSE - Mini Mental State Exam 06/28/2015 10/27/2014 04/23/2014  Orientation to time '5 5 4  '$ Orientation to Place '5 5 5  '$ Registration '3 3 3  '$ Attention/ Calculation '3 3 4  '$ Recall '3 2 2  '$ Language- name 2 objects '2 2 2  '$ Language- repeat '1 1 1  '$ Language- follow 3 step command '3 3 3  '$ Language- read & follow direction '1 1 1  '$ Write a sentence '1 1 1  '$ Copy design '1 1 1  '$ Total score '28 27 27     '$ Generalized: Well developed, in no acute distress   Neurological examination  Mentation: Alert oriented to time, place, history taking. Follows all commands speech and language fluent Cranial nerve II-XII: Pupils were equal round reactive to light. Extraocular movements were full, visual field were full on confrontational test. Facial sensation and strength were normal. Uvula tongue midline. Head turning and shoulder shrug  were normal and symmetric. Motor: The motor testing reveals 5 over 5 strength of all 4 extremities. Good symmetric motor tone is noted throughout.  Sensory: Sensory testing is intact to soft touch  on all 4 extremities. No evidence of extinction is noted.  Coordination: Cerebellar testing reveals good finger-nose-finger and heel-to-shin bilaterally.  Gait and station: Gait is normal. Tandem gait is normal. Romberg is negative. No drift is seen.  Reflexes: Deep tendon reflexes are symmetric and normal bilaterally.   DIAGNOSTIC DATA (LABS, IMAGING, TESTING) - I reviewed patient records, labs, notes, testing and imaging myself where available.  Lab Results  Component Value Date   WBC 5.5 03/03/2015   HGB 14.1 03/03/2015   HCT 43.6 03/03/2015   MCV 86.0 03/03/2015   PLT 181 03/03/2015      Component Value Date/Time   NA 139 01/19/2015 0817   K 4.0 01/19/2015 0817   CL 102 01/19/2015 0817   CO2 24 01/19/2015 0817   GLUCOSE 108* 01/19/2015 0817   BUN 13 01/19/2015 0817   CREATININE 0.80 03/19/2015 1109   CREATININE 0.69 12/16/2014 1058   CALCIUM 10.5* 01/19/2015 0817   PROT 7.0 01/19/2015 0817   ALBUMIN 4.0 01/19/2015 0817   AST 22 01/19/2015 0817   ALT 18 01/19/2015 0817   ALKPHOS 60 01/19/2015 0817   BILITOT 1.1 01/19/2015 0817   GFRNONAA >60 01/19/2015 0817   GFRAA >60 01/19/2015 0817      ASSESSMENT AND PLAN 74 y.o. year old female  has a past medical history of Hypertension; Breast cancer ([place]); GERD (gastroesophageal reflux disease); Memory loss;  Pure hypercholesterolemia; Memory change (10/24/2013); Infiltrating ductal carcinoma of right breast ([place]) (01/09/2015); Pulmonary nodule (01/09/2015); Headache; Anemia; Diarrhea; and Complication of anesthesia. here with:  1. Memory disturbance  Overall the patient is doing well. Her memory score has remained stable. She will continue on Aricept. Patient advised that if her symptoms worsen or she develops any new symptoms she should let us know. She will follow-up in 6 months or sooner if needed.  I spent 15 minutes with the patient 50% of this time was spent counseling the patient on her medication.   Kari Givens, MSN, NP-C 06/28/2015, 9:42 AM [facility] [address] [place], [place] [address] [phone]

## 2015-06-28 NOTE — Progress Notes (Signed)
I have read the note, and I agree with the clinical assessment and plan.  WILLIS,CHARLES KEITH   

## 2015-06-28 NOTE — Patient Instructions (Signed)
Overall you are doing well.  Continue Aricept We will continue to monitor your memory If your symptoms worsen or you develop new symptoms please let us know.

## 2015-06-30 ENCOUNTER — Other Ambulatory Visit: Payer: Self-pay | Admitting: *Deleted

## 2015-06-30 ENCOUNTER — Ambulatory Visit (INDEPENDENT_AMBULATORY_CARE_PROVIDER_SITE_OTHER): Payer: Medicare HMO | Admitting: Cardiothoracic Surgery

## 2015-06-30 ENCOUNTER — Encounter: Payer: Self-pay | Admitting: Cardiothoracic Surgery

## 2015-06-30 ENCOUNTER — Ambulatory Visit
Admission: RE | Admit: 2015-06-30 | Discharge: 2015-06-30 | Disposition: A | Discharge status: Home / Self Care | Payer: Medicare HMO | Source: Ambulatory Visit | Attending: Cardiothoracic Surgery | Admitting: Cardiothoracic Surgery

## 2015-06-30 VITALS — BP 153/85 | HR 67 | Resp 19 | Ht 63.0 in | Wt 167.0 lb

## 2015-06-30 DIAGNOSIS — R911 Solitary pulmonary nodule: Secondary | ICD-10-CM | POA: Diagnosis not present

## 2015-06-30 MED ORDER — IOPAMIDOL (ISOVUE-300) INJECTION 61%
75.0000 mL | Freq: Once | INTRAVENOUS | Status: AC | PRN
  Administered 2015-06-30: 75 mL via INTRAVENOUS

## 2015-06-30 NOTE — Progress Notes (Signed)
PCP is [name], MD Referring Provider is Sheldon Silvan Manon Hilding, PA-C  Chief Complaint  Patient presents with  . Lung Lesion    review CT BX, CT chest, head CT - eval right lung nodule    HPI:the patient returns for further discussion and counseling on bilateral pulmonary nodules. 5 months ago she underwent bronchoscopy and EBUS biopsy of carinal lymph nodes-non malignant lymphoid tissue--and needle biopsy of a 1.5 cm left upper lobe nodule that returned consistent with inflammation-multinucleatedgiant cells and non-caseating necrosis with  negative AFB fungal stains. At that time she had a small 6 mm right middle lobe nodule with faint activity on PET scan. This was felt to be too small to biopsy and a followup CT scan was performed today. CT scan shows the right middle lobe nodule now to be 1.5 cm. There is enlargement of the subcarinal lymph nodes as well. Clinically the patient is feeling well, denies weight loss, has noted increased energy and no specific areas of pain.  Past Medical History  Diagnosis Date  . Hypertension   . Breast cancer ([place])     2001 RT breat lumpectomy rad tx  . GERD (gastroesophageal reflux disease)   . Memory loss   . Pure hypercholesterolemia   . Memory change 10/24/2013  . 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 Pulmonary nodule 01/09/2015  . Headache     years ago, none since hysterectomy  . Anemia     in the past  . Diarrhea   . Complication of anesthesia     allergic reaction to Morphine    Past Surgical History  Procedure Laterality Date  . Abdominal hysterectomy    . Bilateral oophorectomy  2007  . Incision and drainage of wound  2011    buttocks  . Tonsillectomy    . Cataract extraction Bilateral     lens implant  . Carpal tunnel release Right   . Colonoscopy  2011    RMR: 1. Anal papilla, otherwise normal rectum. 2. Left-sided diverticula, single diminutive polyp in the sigmoid segment status post cold biospy  removal. Remainder of the colonic mucosa appeared normal.   . Colonoscopy N/A 01/14/2015    Procedure: COLONOSCOPY;  Surgeon: Daneil Dolin, MD;  [facility];  Service: Endoscopy;  Laterality: N/A;  930 - moved to 10:15  . Esophagogastroduodenoscopy N/A 01/14/2015    RMR: Colonic diverticulosis. status post segmental biospy and stool sampling.  No egd done today  . Breast surgery      right lumpectomy, lymph glands remove  . Video bronchoscopy with endobronchial ultrasound N/A 01/19/2015    Procedure: VIDEO BRONCHOSCOPY WITH ENDOBRONCHIAL ULTRASOUND;  Surgeon: Ivin Poot, MD;  Location: [facility] OR;  Service: Thoracic;  Laterality: N/A;  . Esophagogastroduodenoscopy N/A 02/11/2015    RMR: Focally abnormal antrum of doubtful clinical significance- Status post biopsy.     Family History  Problem Relation Age of Onset  . Stroke Mother   . Breast cancer Maternal Aunt   . Breast cancer Maternal Grandmother   . Heart attack Father   . Pancreatic cancer Brother     Social History Social History  Substance Use Topics  . Smoking status: Former Smoker -- 1.00 packs/day for 20 years    Types: Cigarettes    Quit date: 11/20/1999  . Smokeless tobacco: Never Used  . Alcohol Use: Yes     Comment: occasional wine    Current Outpatient Prescriptions  Medication Sig Dispense Refill  .  aspirin EC 81 MG tablet Take 81 mg by mouth daily.    . cholecalciferol (VITAMIN D) 1000 UNITS tablet Take 1,000 Units by mouth at bedtime.    Marland Kitchen dexlansoprazole (DEXILANT) 60 MG capsule Take 60 mg by mouth daily before breakfast.     . donepezil (ARICEPT) 10 MG tablet TAKE 1 TABLET BY MOUTH EVERY NIGHT AT BEDTIME 90 tablet 1  . losartan-hydrochlorothiazide (HYZAAR) 50-12.5 MG per tablet Take 1 tablet by mouth daily.     . naproxen sodium (ANAPROX) 220 MG tablet Take 220 mg by mouth 2 (two) times daily as needed (pain). ALEVE    . omeprazole (PRILOSEC) 20 MG capsule TAKE 1 CAPSULE(20 MG) BY MOUTH TWICE  DAILY BEFORE A MEAL 60 capsule 5   No current facility-administered medications for this visit.    Allergies  Allergen Reactions  . Codeine Nausea And Vomiting  . Morphine And Related Itching  . Adhesive [Tape] Rash    Please use paper tape  . Sulfur Rash  . Tamoxifen Other (See Comments)    Blurred vision and dizziness    Review of Systems  No weight loss or productive cough  No headache or neurologic symptoms       Review of Systems :  [ y ] = yes, [  ] = no        General :  Weight gain [   ]    Weight loss  [   ]  Fatigue [  ]  Fever [  ]  Chills  [  ]                                Weakness  [  ]           Cardiac :  Chest pain/ pressure [  ]  Resting SOB [  ] exertional SOB [  ]                        Orthopnea [  ]  Pedal edema  [  ]  Palpitations [  ] Syncope/presyncope '[ ]'$                         Paroxysmal nocturnal dyspnea [  ]        Pulmonary : cough [  ]  wheezing [  ]  Hemoptysis [  ] Sputum [  ] Snoring [  ]                              Pneumothorax [  ]  Sleep apnea [  ]       GI : Vomiting [  ]  Dysphagia [  ]  Melena  [  ]  Abdominal pain [  ] BRBPR [  ]              Heart burn [  ]  Constipation [  ] Diarrhea  [  ] Colonoscopy [  ]       GU : Hematuria [  ]  Dysuria [  ]  Nocturia [  ] UTI's [  ]       Vascular : Claudication [  ]  Rest pain [  ]  DVT [  ] Vein stripping [  ] leg ulcers [  ]  TIA [  ] Stroke [  ]  Varicose veins [  ]       NEURO :  Headaches  [  ] Seizures [  ] Vision changes [  ] Paresthesias [  ]       Musculoskeletal :  Arthritis [  ] Gout  [  ]  Back pain [ yes-chronic ]  Joint pain [place].Spore  ]       Skin :  Rash [  ]  Melanoma [  ]        Heme : Bleeding problems [  ]Clotting Disorders [  ] Anemia [  ]Blood Transfusion '[ ]'$        Endocrine : Diabetes [  ] Thyroid Disorder  [  ]       Psych : Depression [  ]  Anxiety [  ]  Psych hospitalizations [  ]                                               BP 153/85 mmHg   Pulse 67  Resp 19  Ht '5\' 3"'$  (1.6 m)  Wt 167 lb (75.751 kg)  BMI 29.59 kg/m2  SpO2 96% Physical Exam      Physical Exam  General:  Obese elderlyfragile Caucasian female no acute distress accompanied by her husband HEENT: Normocephalic pupils equal , dentition adequate Neck: Supple without JVD, adenopathy, or bruit Chest: Clear to auscultation, symmetrical breath sounds, no rhonchi, no tenderness             or deformity Cardiovascular: Regular rate and rhythm, no murmur, no gallop, peripheral pulses             palpable in all extremities Abdomen:  Soft, nontender, no palpable mass or organomegaly Extremities: Warm, well-perfused, no clubbing cyanosis edema or tenderness,              no venous stasis changes of the legs Rectal/GU: Deferred Neuro: Grossly non--focal and symmetrical throughout Skin: Clean and dry without rash or ulceration   Diagnostic Tests: CT scan shows significant enlargement right middle lobe nodule now 1.5 cm. Subcarinal lymph node station is increased in size. We'll proceed with CT directed needle biopsy of right middle lobe nodule. It appears patient may have stage III lung cancer. She states she has an oncologist at [facility] who has  been following her breast cancer.  Impression: Possible stage III lung cancer right middle lobe She's not a surgical candidate We'll proceed with needle biopsy and if positive will refer to oncology.  Plan: returnto discuss results of needle biopsy. Patient will be away on vacation next week and wishes to have the biopsy performed last week in October.  Len Childs, MD [facility] [phone]

## 2015-07-13 ENCOUNTER — Other Ambulatory Visit: Payer: Self-pay | Admitting: Radiology

## 2015-07-14 ENCOUNTER — Other Ambulatory Visit: Payer: Self-pay | Admitting: Radiology

## 2015-07-15 ENCOUNTER — Ambulatory Visit (HOSPITAL_COMMUNITY)
Admission: RE | Admit: 2015-07-15 | Discharge: 2015-07-15 | Disposition: A | Discharge status: Home / Self Care | Payer: Medicare HMO | Source: Ambulatory Visit | Attending: Cardiothoracic Surgery | Admitting: Cardiothoracic Surgery

## 2015-07-15 ENCOUNTER — Ambulatory Visit (HOSPITAL_COMMUNITY)
Admission: RE | Admit: 2015-07-15 | Discharge: 2015-07-15 | Disposition: A | Discharge status: Home / Self Care | Payer: Medicare HMO | Source: Ambulatory Visit | Attending: Interventional Radiology | Admitting: Interventional Radiology

## 2015-07-15 DIAGNOSIS — I10 Essential (primary) hypertension: Secondary | ICD-10-CM | POA: Insufficient documentation

## 2015-07-15 DIAGNOSIS — R911 Solitary pulmonary nodule: Secondary | ICD-10-CM

## 2015-07-15 DIAGNOSIS — C342 Malignant neoplasm of middle lobe, bronchus or lung: Secondary | ICD-10-CM | POA: Diagnosis not present

## 2015-07-15 DIAGNOSIS — Z853 Personal history of malignant neoplasm of breast: Secondary | ICD-10-CM | POA: Diagnosis not present

## 2015-07-15 DIAGNOSIS — Z7982 Long term (current) use of aspirin: Secondary | ICD-10-CM | POA: Diagnosis not present

## 2015-07-15 DIAGNOSIS — Z885 Allergy status to narcotic agent status: Secondary | ICD-10-CM | POA: Diagnosis not present

## 2015-07-15 DIAGNOSIS — K219 Gastro-esophageal reflux disease without esophagitis: Secondary | ICD-10-CM | POA: Insufficient documentation

## 2015-07-15 DIAGNOSIS — R918 Other nonspecific abnormal finding of lung field: Secondary | ICD-10-CM | POA: Diagnosis present

## 2015-07-15 DIAGNOSIS — Z87891 Personal history of nicotine dependence: Secondary | ICD-10-CM | POA: Diagnosis not present

## 2015-07-15 DIAGNOSIS — Z79899 Other long term (current) drug therapy: Secondary | ICD-10-CM | POA: Diagnosis not present

## 2015-07-15 DIAGNOSIS — E7800 Pure hypercholesterolemia, unspecified: Secondary | ICD-10-CM | POA: Diagnosis not present

## 2015-07-15 DIAGNOSIS — J95811 Postprocedural pneumothorax: Secondary | ICD-10-CM

## 2015-07-15 DIAGNOSIS — Z882 Allergy status to sulfonamides status: Secondary | ICD-10-CM | POA: Insufficient documentation

## 2015-07-15 LAB — PROTIME-INR
INR: 1.06 (ref 0.00–1.49)
PROTHROMBIN TIME: 14 s (ref 11.6–15.2)

## 2015-07-15 LAB — CBC
HCT: 42.7 % (ref 36.0–46.0)
HEMOGLOBIN: 13.9 g/dL (ref 12.0–15.0)
MCH: 28.6 pg (ref 26.0–34.0)
MCHC: 32.6 g/dL (ref 30.0–36.0)
MCV: 87.9 fL (ref 78.0–100.0)
PLATELETS: 163 10*3/uL (ref 150–400)
RBC: 4.86 MIL/uL (ref 3.87–5.11)
RDW: 13.8 % (ref 11.5–15.5)
WBC: 5.3 10*3/uL (ref 4.0–10.5)

## 2015-07-15 LAB — APTT: aPTT: 33 seconds (ref 24–37)

## 2015-07-15 MED ORDER — SODIUM CHLORIDE 0.9 % IV SOLN
Freq: Once | INTRAVENOUS | Status: AC
  Administered 2015-07-15: 11:00:00 via INTRAVENOUS

## 2015-07-15 MED ORDER — FENTANYL CITRATE (PF) 100 MCG/2ML IJ SOLN
INTRAMUSCULAR | Status: AC
  Filled 2015-07-15: qty 2

## 2015-07-15 MED ORDER — FENTANYL CITRATE (PF) 100 MCG/2ML IJ SOLN
INTRAMUSCULAR | Status: AC | PRN
  Administered 2015-07-15: 50 ug via INTRAVENOUS

## 2015-07-15 MED ORDER — MIDAZOLAM HCL 2 MG/2ML IJ SOLN
INTRAMUSCULAR | Status: AC | PRN
  Administered 2015-07-15: 1 mg via INTRAVENOUS

## 2015-07-15 MED ORDER — LIDOCAINE HCL 1 % IJ SOLN
INTRAMUSCULAR | Status: AC
  Filled 2015-07-15: qty 20

## 2015-07-15 MED ORDER — MIDAZOLAM HCL 2 MG/2ML IJ SOLN
INTRAMUSCULAR | Status: AC
  Filled 2015-07-15: qty 2

## 2015-07-15 NOTE — Sedation Documentation (Signed)
Patient is resting comfortably. 

## 2015-07-15 NOTE — Discharge Instructions (Signed)

## 2015-07-15 NOTE — H&P (Signed)
Referring Physician(s): Lucianne Lei Trigt,Peter  Chief Complaint:  Right lung mass  Subjective:  Kari Bullock familiar to IR service from prior CT guided left lung nodule biopsy on 03/03/15 which revealed non necrotizing granulomatous inflammation with neg cultures. At the time Kari Bullock also had known smaller RML nodule which was hypermetabolic on PET. She has hx of rt breast cancer and was prior smoker. Recent f/u CT chest reveals enlargement of the RML nodule currently measuring 1.5 cm along with enlarged subcarinal lymph node. She presents again today for CT guided biopsy of the RML lung nodule. She currently denies fever, HA, CP, dyspnea, abd pain, N/V or abnormal bleeding. She does have occ cough and back pain.  Past Medical History  Diagnosis Date  . Hypertension   . Breast cancer ([place])     2001 RT breat lumpectomy rad tx  . GERD (gastroesophageal reflux disease)   . Memory loss   . Pure hypercholesterolemia   . Memory change 10/24/2013  . 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 Pulmonary nodule 01/09/2015  . Headache     years ago, none since hysterectomy  . Anemia     in the past  . Diarrhea   . Complication of anesthesia     allergic reaction to Morphine   Past Surgical History  Procedure Laterality Date  . Abdominal hysterectomy    . Bilateral oophorectomy  2007  . Incision and drainage of wound  2011    buttocks  . Tonsillectomy    . Cataract extraction Bilateral     lens implant  . Carpal tunnel release Right   . Colonoscopy  2011    RMR: 1. Anal papilla, otherwise normal rectum. 2. Left-sided diverticula, single diminutive polyp in the sigmoid segment status post cold biospy removal. Remainder of the colonic mucosa appeared normal.   . Colonoscopy N/A 01/14/2015    Procedure: COLONOSCOPY;  Surgeon: Daneil Dolin, MD;  [facility];  Service: Endoscopy;  Laterality: N/A;  930 - moved to 10:15  . Esophagogastroduodenoscopy N/A 01/14/2015    RMR:  Colonic diverticulosis. status post segmental biospy and stool sampling.  No egd done today  . Breast surgery      right lumpectomy, lymph glands remove  . Video bronchoscopy with endobronchial ultrasound N/A 01/19/2015    Procedure: VIDEO BRONCHOSCOPY WITH ENDOBRONCHIAL ULTRASOUND;  Surgeon: Ivin Poot, MD;  Location[facility];  Service: Thoracic;  Laterality: N/A;  . Esophagogastroduodenoscopy N/A 02/11/2015    RMR: Focally abnormal antrum of doubtful clinical significance- Status post biopsy.      Allergies: Codeine; Morphine and related; Adhesive; Sulfur; and Tamoxifen  Medications: Prior to Admission medications   Medication Sig Start Date End Date Taking? Authorizing Provider  aspirin EC 81 MG tablet Take 81 mg by mouth daily.   Yes Historical Provider, MD  cholecalciferol (VITAMIN D) 1000 UNITS tablet Take 1,000 Units by mouth at bedtime.   Yes Historical Provider, MD  donepezil (ARICEPT) 10 MG tablet TAKE 1 TABLET BY MOUTH EVERY NIGHT AT BEDTIME 12/27/14  Yes Kathrynn Ducking, MD  losartan-hydrochlorothiazide [facility]) 50-12.5 MG per tablet Take 1 tablet by mouth daily.  09/30/11  Yes Historical Provider, MD  naproxen sodium (ANAPROX) 220 MG tablet Take 220 mg by mouth 2 (two) times daily as needed (pain). ALEVE   Yes Historical Provider, MD  omeprazole (PRILOSEC) 20 MG capsule TAKE 1 CAPSULE(20 MG) BY MOUTH TWICE DAILY BEFORE A MEAL 05/05/15  Yes Magda Paganini  Jaclyn Shaggy, PA-C     Vital Signs: BP 169/59 mmHg  Pulse 60  Temp(Src) 97.7 F (36.5 C)  Resp 20  Ht '5\' 2"'$  (1.575 m)  Wt 163 lb (73.936 kg)  BMI 29.81 kg/m2  SpO2 98%  Physical Exam  Constitutional: She is oriented to person, place, and time. She appears well-developed and well-nourished.  Cardiovascular: Normal rate and regular rhythm.   Pulmonary/Chest: Effort normal.  Distant but clear BS bilat  Abdominal: Soft. Bowel sounds are normal. There is no tenderness.  Musculoskeletal: Normal range of motion. She exhibits no  edema.  Neurological: She is alert and oriented to person, place, and time.    Imaging: No results found.  Labs:  CBC:  Recent Labs  09/01/14 1049 01/11/15 1145 01/19/15 0817 03/03/15 0752  WBC 6.2 7.0 5.5 5.5  HGB 13.1 14.2 13.8 14.1  HCT 40.3 43.8 43.0 43.6  PLT 202 206 182 181    COAGS:  Recent Labs  01/19/15 0817 03/03/15 0840  INR 1.04 1.06  APTT 35 29    BMP:  Recent Labs  09/01/14 1049 12/16/14 1058 01/11/15 1145 01/19/15 0817 03/19/15 1109  NA 138 140 138 139  --   K 3.9 4.0 3.7 4.0  --   CL 99 100 102 102  --   CO2 '25 28 28 24  '$ --   GLUCOSE 107* 112* 83 108*  --   BUN '14 14 21 13  '$ --   CALCIUM 10.5 10.6* 10.5 10.5*  --   CREATININE 0.64 0.69 0.80 0.71 0.80  GFRNONAA 86*  --  70* >60  --   GFRAA >90  --  82* >60  --     LIVER FUNCTION TESTS:  Recent Labs  12/16/14 1058 01/11/15 1145 01/19/15 0817  BILITOT 0.5 0.6 1.1  AST '19 25 22  '$ ALT '15 21 18  '$ ALKPHOS 65 61 60  PROT 6.5 7.2 7.0  ALBUMIN 4.0 4.4 4.0    Assessment and Plan: 74 yo WF, former smoker with hx  rt breast cancer and enlarging RML lung nodule. Plan is for CT guided RML lung nodule biopsy today. Risks and benefits discussed with the patient/family including, but not limited to bleeding, infection, damage to adjacent structures, pneumothorax requiring chest tube or low yield requiring additional tests and death. All of the patient's questions were answered, patient is agreeable to proceed. Consent signed and in chart.     Signed: D. Rowe Robert 07/15/2015, 10:08 AM   I spent a total of 15 minutes at the the patient's bedside AND on the patient's hospital floor or unit, greater than 50% of which was counseling/coordinating care for CT guided right lung nodule biopsy

## 2015-07-15 NOTE — Procedures (Signed)
Interventional Radiology Procedure Note  Procedure: CT guided biopsy of right middle lobe nodule.  Deployment of [employer] Complications: None Recommendations: - Bedrest until CXR cleared.  Minimize talking, coughing or otherwise straining.  - Follow up 2 hr CXR pending   Signed,  Arius Harnois S. Earleen Newport, DO

## 2015-07-21 ENCOUNTER — Ambulatory Visit (INDEPENDENT_AMBULATORY_CARE_PROVIDER_SITE_OTHER): Payer: Medicare HMO | Admitting: Cardiothoracic Surgery

## 2015-07-21 ENCOUNTER — Encounter: Payer: Self-pay | Admitting: Cardiothoracic Surgery

## 2015-07-21 VITALS — BP 141/77 | HR 60 | Resp 16 | Ht 63.0 in | Wt 167.0 lb

## 2015-07-21 DIAGNOSIS — Z9889 Other specified postprocedural states: Secondary | ICD-10-CM | POA: Diagnosis not present

## 2015-07-21 DIAGNOSIS — C342 Malignant neoplasm of middle lobe, bronchus or lung: Secondary | ICD-10-CM

## 2015-07-21 NOTE — Progress Notes (Signed)
PCP is [name], MD Referring Provider is Sheldon Silvan Manon Hilding, PA-C  Chief Complaint  Patient presents with  . Follow-up    s/p BX RMLobe    NTZ:GYFVCBS returns for followup discussion and counseling after undergoing transthoracic needle biopsy of a 1.6 mm right middle lobe nodule. 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 Most recent CT scan shows an enlarged subcarinal lymph node. This had been previously biopsied 6 months ago--it was  Less than a centimeter but with metabolic activity on PET scan. that was negative with  reactive -repairative changes.the patient denies any symptoms from either pulmonary or systemic. Brain scan performed earlier this year was negative. A left lower lobe needle biopsy performed earlier this year was negative for cancer showing noncaseating granulomatous changes  Past Medical History  Diagnosis Date  . Hypertension   . Breast cancer ([place])     2001 RT breat lumpectomy rad tx  . GERD (gastroesophageal reflux disease)   . Memory loss   . Pure hypercholesterolemia   . Memory change 10/24/2013  . 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 Pulmonary nodule 01/09/2015  . Headache     years ago, none since hysterectomy  . Anemia     in the past  . Diarrhea   . Complication of anesthesia     allergic reaction to Morphine    Past Surgical History  Procedure Laterality Date  . Abdominal hysterectomy    . Bilateral oophorectomy  2007  . Incision and drainage of wound  2011    buttocks  . Tonsillectomy    . Cataract extraction Bilateral     lens implant  . Carpal tunnel release Right   . Colonoscopy  2011    RMR: 1. Anal papilla, otherwise normal rectum. 2. Left-sided diverticula, single diminutive polyp in the sigmoid segment status post cold biospy removal. Remainder of the colonic mucosa appeared normal.   . Colonoscopy N/A 01/14/2015    Procedure: COLONOSCOPY;  Surgeon: Daneil Dolin, MD;  [facility]: [facility];  Service: Endoscopy;  Laterality: N/A;  930 - moved to 10:15  . Esophagogastroduodenoscopy N/A 01/14/2015    RMR: Colonic diverticulosis. status post segmental biospy and stool sampling.  No egd done today  . Breast surgery      right lumpectomy, lymph glands remove  . Video bronchoscopy with endobronchial ultrasound N/A 01/19/2015    Procedure: VIDEO BRONCHOSCOPY WITH ENDOBRONCHIAL ULTRASOUND;  Surgeon: Ivin Poot, MD;  Location: [facility] OR;  Service: Thoracic;  Laterality: N/A;  . Esophagogastroduodenoscopy N/A 02/11/2015    RMR: Focally abnormal antrum of doubtful clinical significance- Status post biopsy.     Family History  Problem Relation Age of Onset  . Stroke Mother   . Breast cancer Maternal Aunt   . Breast cancer Maternal Grandmother   . Heart attack Father   . Pancreatic cancer Brother     Social History Social History  Substance Use Topics  . Smoking status: Former Smoker -- 1.00 packs/day for 20 years    Types: Cigarettes    Quit date: 11/20/1999  . Smokeless tobacco: Never Used  . Alcohol Use: Yes     Comment: occasional wine    Current Outpatient Prescriptions  Medication Sig Dispense Refill  . aspirin EC 81 MG tablet Take 81 mg by mouth daily.    . cholecalciferol (VITAMIN D) 1000 UNITS tablet Take 1,000 Units by mouth at bedtime.    . donepezil (ARICEPT) 10 MG tablet  TAKE 1 TABLET BY MOUTH EVERY NIGHT AT BEDTIME 90 tablet 1  . losartan-hydrochlorothiazide (HYZAAR) 50-12.5 MG per tablet Take 1 tablet by mouth daily.     . naproxen sodium (ANAPROX) 220 MG tablet Take 220 mg by mouth 2 (two) times daily as needed (pain). ALEVE    . omeprazole (PRILOSEC) 20 MG capsule TAKE 1 CAPSULE(20 MG) BY MOUTH TWICE DAILY BEFORE A MEAL 60 capsule 5   No current facility-administered medications for this visit.    Allergies  Allergen Reactions  . Codeine Nausea And Vomiting  . Morphine And Related Itching  . Adhesive [Tape] Rash     Please use paper tape  . Sulfur Rash  . Tamoxifen Other (See Comments)    Blurred vision and dizziness    Review of Systems  Negative for fever weight loss No bone pain  BP 141/77 mmHg  Pulse 60  Resp 16  Ht '5\' 3"'$  (1.6 m)  Wt 167 lb (75.751 kg)  BMI 29.59 kg/m2  SpO2 97% Physical Exam Alert and comfortable Lungs clear Slight area of ecchymoses over right anterior chest from needle biopsy Heart rate regular Abdomen soft Neuro intact  Diagnostic Tests: Pathology report reviewed and copy of report given to patient.  Impression: Squamous cell carcinoma right middle lobe. Enlarged subcarinal lymph node is suspicious for stage III disease. We'll plan on repeat bronchoscopy-EBUS if recommended by the thoracic oncology conference. The patient would prefer SBRT  as primary therapy  if she is not stage III.  Plan:   Len Childs, MD [facility] [phone]

## 2015-07-22 ENCOUNTER — Other Ambulatory Visit: Payer: Self-pay | Admitting: *Deleted

## 2015-07-22 DIAGNOSIS — C342 Malignant neoplasm of middle lobe, bronchus or lung: Secondary | ICD-10-CM

## 2015-07-23 ENCOUNTER — Encounter (HOSPITAL_COMMUNITY): Payer: Self-pay | Admitting: *Deleted

## 2015-07-27 ENCOUNTER — Ambulatory Visit (HOSPITAL_COMMUNITY): Payer: Medicare HMO | Admitting: Anesthesiology

## 2015-07-27 ENCOUNTER — Encounter (HOSPITAL_COMMUNITY)
Admission: RE | Disposition: A | Discharge status: Home / Self Care | Payer: Self-pay | Source: Ambulatory Visit | Attending: Cardiothoracic Surgery

## 2015-07-27 ENCOUNTER — Ambulatory Visit (HOSPITAL_COMMUNITY): Payer: Medicare HMO

## 2015-07-27 ENCOUNTER — Ambulatory Visit (HOSPITAL_COMMUNITY)
Admission: RE | Admit: 2015-07-27 | Discharge: 2015-07-27 | Disposition: A | Discharge status: Home / Self Care | Payer: Medicare HMO | Source: Ambulatory Visit | Attending: Cardiothoracic Surgery | Admitting: Cardiothoracic Surgery

## 2015-07-27 DIAGNOSIS — J939 Pneumothorax, unspecified: Secondary | ICD-10-CM

## 2015-07-27 DIAGNOSIS — C3491 Malignant neoplasm of unspecified part of right bronchus or lung: Secondary | ICD-10-CM | POA: Insufficient documentation

## 2015-07-27 DIAGNOSIS — C342 Malignant neoplasm of middle lobe, bronchus or lung: Secondary | ICD-10-CM

## 2015-07-27 HISTORY — PX: VIDEO BRONCHOSCOPY WITH ENDOBRONCHIAL ULTRASOUND: SHX6177

## 2015-07-27 LAB — POCT I-STAT 4, (NA,K, GLUC, HGB,HCT)
Glucose, Bld: 98 mg/dL (ref 65–99)
HCT: 42 % (ref 36.0–46.0)
Hemoglobin: 14.3 g/dL (ref 12.0–15.0)
Potassium: 3.7 mmol/L (ref 3.5–5.1)
Sodium: 140 mmol/L (ref 135–145)

## 2015-07-27 SURGERY — BRONCHOSCOPY, WITH EBUS
Anesthesia: General | Site: Chest

## 2015-07-27 MED ORDER — NEOSTIGMINE METHYLSULFATE 10 MG/10ML IV SOLN
INTRAVENOUS | Status: AC
  Filled 2015-07-27: qty 1

## 2015-07-27 MED ORDER — HYDROMORPHONE HCL 1 MG/ML IJ SOLN: 0.2500 mg | INTRAMUSCULAR | Status: DC | PRN

## 2015-07-27 MED ORDER — ONDANSETRON HCL 4 MG/2ML IJ SOLN
INTRAMUSCULAR | Status: DC | PRN
  Administered 2015-07-27: 4 mg via INTRAVENOUS

## 2015-07-27 MED ORDER — LIDOCAINE HCL 4 % MT SOLN
OROMUCOSAL | Status: DC | PRN
  Administered 2015-07-27: 4 mL via TOPICAL

## 2015-07-27 MED ORDER — SODIUM CHLORIDE 0.9 % IJ SOLN: 3.0000 mL | INTRAMUSCULAR | Status: DC | PRN

## 2015-07-27 MED ORDER — CEFAZOLIN SODIUM-DEXTROSE 2-3 GM-% IV SOLR
INTRAVENOUS | Status: AC
  Filled 2015-07-27: qty 50

## 2015-07-27 MED ORDER — 0.9 % SODIUM CHLORIDE (POUR BTL) OPTIME
TOPICAL | Status: DC | PRN
  Administered 2015-07-27: 1000 mL

## 2015-07-27 MED ORDER — ACETAMINOPHEN 650 MG RE SUPP
650.0000 mg | RECTAL | Status: DC | PRN
  Filled 2015-07-27: qty 1

## 2015-07-27 MED ORDER — GLYCOPYRROLATE 0.2 MG/ML IJ SOLN
INTRAMUSCULAR | Status: DC | PRN
Start: 2015-07-27 — End: 2015-07-27
  Administered 2015-07-27: 0.4 mg via INTRAVENOUS

## 2015-07-27 MED ORDER — ROCURONIUM BROMIDE 100 MG/10ML IV SOLN
INTRAVENOUS | Status: DC | PRN
  Administered 2015-07-27: 30 mg via INTRAVENOUS

## 2015-07-27 MED ORDER — FENTANYL CITRATE (PF) 250 MCG/5ML IJ SOLN
INTRAMUSCULAR | Status: AC
  Filled 2015-07-27: qty 5

## 2015-07-27 MED ORDER — EPHEDRINE SULFATE 50 MG/ML IJ SOLN
INTRAMUSCULAR | Status: DC | PRN
  Administered 2015-07-27: 10 mg via INTRAVENOUS

## 2015-07-27 MED ORDER — FENTANYL CITRATE (PF) 100 MCG/2ML IJ SOLN
INTRAMUSCULAR | Status: DC | PRN
  Administered 2015-07-27: 100 ug via INTRAVENOUS
  Administered 2015-07-27: 50 ug via INTRAVENOUS

## 2015-07-27 MED ORDER — ESMOLOL HCL 100 MG/10ML IV SOLN
INTRAVENOUS | Status: AC
  Filled 2015-07-27: qty 10

## 2015-07-27 MED ORDER — CEFAZOLIN SODIUM-DEXTROSE 2-3 GM-% IV SOLR
INTRAVENOUS | Status: DC | PRN
  Administered 2015-07-27: 2 g via INTRAVENOUS

## 2015-07-27 MED ORDER — GLYCOPYRROLATE 0.2 MG/ML IJ SOLN
INTRAMUSCULAR | Status: AC
  Filled 2015-07-27: qty 2

## 2015-07-27 MED ORDER — SODIUM CHLORIDE 0.9 % IJ SOLN: 3.0000 mL | Freq: Two times a day (BID) | INTRAMUSCULAR | Status: DC

## 2015-07-27 MED ORDER — OXYCODONE HCL 5 MG/5ML PO SOLN: 5.0000 mg | Freq: Once | ORAL | Status: DC | PRN

## 2015-07-27 MED ORDER — EPINEPHRINE HCL 1 MG/ML IJ SOLN
INTRAMUSCULAR | Status: AC
Start: 2015-07-27 — End: 2015-07-27
  Filled 2015-07-27: qty 1

## 2015-07-27 MED ORDER — PHENYLEPHRINE HCL 10 MG/ML IJ SOLN
10.0000 mg | INTRAVENOUS | Status: DC | PRN
  Administered 2015-07-27: 20 ug/min via INTRAVENOUS

## 2015-07-27 MED ORDER — OXYCODONE HCL 5 MG PO TABS: 5.0000 mg | ORAL_TABLET | Freq: Once | ORAL | Status: DC | PRN

## 2015-07-27 MED ORDER — LACTATED RINGERS IV SOLN
INTRAVENOUS | Status: DC | PRN
  Administered 2015-07-27: 13:00:00 via INTRAVENOUS

## 2015-07-27 MED ORDER — ONDANSETRON HCL 4 MG/2ML IJ SOLN: 4.0000 mg | Freq: Four times a day (QID) | INTRAMUSCULAR | Status: DC | PRN

## 2015-07-27 MED ORDER — ONDANSETRON HCL 4 MG/2ML IJ SOLN
INTRAMUSCULAR | Status: AC
  Filled 2015-07-27: qty 2

## 2015-07-27 MED ORDER — LIDOCAINE HCL (CARDIAC) 20 MG/ML IV SOLN
INTRAVENOUS | Status: AC
  Filled 2015-07-27: qty 5

## 2015-07-27 MED ORDER — ACETAMINOPHEN 325 MG PO TABS
650.0000 mg | ORAL_TABLET | ORAL | Status: DC | PRN
  Filled 2015-07-27: qty 2

## 2015-07-27 MED ORDER — STERILE WATER FOR INJECTION IJ SOLN
INTRAMUSCULAR | Status: AC
  Filled 2015-07-27: qty 10

## 2015-07-27 MED ORDER — OXYCODONE HCL 5 MG PO TABS: 5.0000 mg | ORAL_TABLET | ORAL | Status: DC | PRN

## 2015-07-27 MED ORDER — ESMOLOL HCL 100 MG/10ML IV SOLN
INTRAVENOUS | Status: DC | PRN
  Administered 2015-07-27: 20 mg via INTRAVENOUS

## 2015-07-27 MED ORDER — PROPOFOL 10 MG/ML IV BOLUS
INTRAVENOUS | Status: AC
  Filled 2015-07-27: qty 20

## 2015-07-27 MED ORDER — PROPOFOL 10 MG/ML IV BOLUS
INTRAVENOUS | Status: DC | PRN
  Administered 2015-07-27: 40 mg via INTRAVENOUS
  Administered 2015-07-27: 10 mg via INTRAVENOUS
  Administered 2015-07-27: 120 mg via INTRAVENOUS

## 2015-07-27 MED ORDER — NEOSTIGMINE METHYLSULFATE 10 MG/10ML IV SOLN
INTRAVENOUS | Status: DC | PRN
Start: 2015-07-27 — End: 2015-07-27
  Administered 2015-07-27: 3 mg via INTRAVENOUS

## 2015-07-27 MED ORDER — DEXAMETHASONE SODIUM PHOSPHATE 4 MG/ML IJ SOLN
INTRAMUSCULAR | Status: AC
  Filled 2015-07-27: qty 2

## 2015-07-27 MED ORDER — SODIUM CHLORIDE 0.9 % IV SOLN: 250.0000 mL | INTRAVENOUS | Status: DC | PRN

## 2015-07-27 MED ORDER — EPHEDRINE SULFATE 50 MG/ML IJ SOLN
INTRAMUSCULAR | Status: AC
  Filled 2015-07-27: qty 1

## 2015-07-27 MED ORDER — FENTANYL CITRATE (PF) 100 MCG/2ML IJ SOLN: 25.0000 ug | INTRAMUSCULAR | Status: DC | PRN

## 2015-07-27 MED ORDER — LIDOCAINE HCL (CARDIAC) 20 MG/ML IV SOLN
INTRAVENOUS | Status: DC | PRN
  Administered 2015-07-27: 60 mg via INTRAVENOUS

## 2015-07-27 MED ORDER — DEXAMETHASONE SODIUM PHOSPHATE 4 MG/ML IJ SOLN
INTRAMUSCULAR | Status: DC | PRN
  Administered 2015-07-27: 8 mg via INTRAVENOUS

## 2015-07-27 SURGICAL SUPPLY — 27 items
BRUSH CYTOL CELLEBRITY 1.5X140 (MISCELLANEOUS) IMPLANT
CANISTER SUCTION 2500CC (MISCELLANEOUS) ×3 IMPLANT
CONT SPEC 4OZ CLIKSEAL STRL BL (MISCELLANEOUS) ×3 IMPLANT
COTTONBALL LRG STERILE PKG (GAUZE/BANDAGES/DRESSINGS) IMPLANT
COVER DOME SNAP 22 D (MISCELLANEOUS) ×3 IMPLANT
COVER TABLE BACK 60X90 (DRAPES) ×3 IMPLANT
FORCEPS BIOP RJ4 1.8 (CUTTING FORCEPS) IMPLANT
GAUZE SPONGE 4X4 12PLY STRL (GAUZE/BANDAGES/DRESSINGS) IMPLANT
GLOVE BIO SURGEON STRL SZ7.5 (GLOVE) ×3 IMPLANT
KIT CLEAN ENDO COMPLIANCE (KITS) ×9 IMPLANT
KIT ROOM TURNOVER OR (KITS) ×3 IMPLANT
MARKER SKIN DUAL TIP RULER LAB (MISCELLANEOUS) ×3 IMPLANT
NEEDLE 22X1 1/2 (OR ONLY) (NEEDLE) IMPLANT
NEEDLE BIOPSY TRANSBRONCH 21G (NEEDLE) IMPLANT
NEEDLE BLUNT 18X1 FOR OR ONLY (NEEDLE) IMPLANT
NEEDLE SONO TIP II EBUS (NEEDLE) ×6 IMPLANT
NS IRRIG 1000ML POUR BTL (IV SOLUTION) ×3 IMPLANT
OIL SILICONE PENTAX (PARTS (SERVICE/REPAIRS)) ×3 IMPLANT
PAD ARMBOARD 7.5X6 YLW CONV (MISCELLANEOUS) ×6 IMPLANT
SYR 20CC LL (SYRINGE) ×3 IMPLANT
SYR 20ML ECCENTRIC (SYRINGE) ×3 IMPLANT
SYR 5ML LUER SLIP (SYRINGE) ×3 IMPLANT
SYR CONTROL 10ML LL (SYRINGE) IMPLANT
TOWEL OR 17X26 10 PK STRL BLUE (TOWEL DISPOSABLE) ×3 IMPLANT
TRAP SPECIMEN MUCOUS 40CC (MISCELLANEOUS) ×3 IMPLANT
TUBE CONNECTING 20'X1/4 (TUBING) ×2
TUBE CONNECTING 20X1/4 (TUBING) ×4 IMPLANT

## 2015-07-27 NOTE — Brief Op Note (Signed)
07/27/2015  3:29 PM  PATIENT:  Kari Bullock  74 y.o. female  PRE-OPERATIVE DIAGNOSIS:  RIGHT LUNG CANCER  POST-OPERATIVE DIAGNOSIS:  right lung cancer  PROCEDURE:  Procedure(s): VIDEO BRONCHOSCOPY WITH ENDOBRONCHIAL ULTRASOUND (N/A) biopsy of level 7 nodes( subcarinal)  SURGEON:  Surgeon(s) and Role:    * Ivin Poot, MD - Primary  PHYSICIAN ASSISTANT:   ASSISTANTS: none   ANESTHESIA:   general  EBL:  Total I/O In: 600 [I.V.:600] Out: 10 [Blood:10]  BLOOD ADMINISTERED:none  DRAINS: none   LOCAL MEDICATIONS USED:  NONE  SPECIMEN:  Aspirate  Level 7 node DISPOSITION OF SPECIMEN:  PATHOLOGY  COUNTS:  YES  TOURNIQUET:  * No tourniquets in log *  DICTATION: .Dragon Dictation  PLAN OF CARE: Discharge to home after PACU  PATIENT DISPOSITION:  PACU - hemodynamically stable.   Delay start of Pharmacological VTE agent (>24hrs) due to surgical blood loss or risk of bleeding: yes

## 2015-07-27 NOTE — Anesthesia Postprocedure Evaluation (Signed)
Anesthesia Post Note  Patient: Kari Bullock  Procedure(s) Performed: Procedure(s) (LRB): VIDEO BRONCHOSCOPY WITH ENDOBRONCHIAL ULTRASOUND (N/A)  Anesthesia type: General  Patient location: PACU  Post pain: Pain level controlled and Adequate analgesia  Post assessment: Post-op Vital signs reviewed, Patient's Cardiovascular Status Stable, Respiratory Function Stable, Patent Airway and Pain level controlled  Last Vitals:  Filed Vitals:   07/27/15 1615  BP: 102/56  Pulse:   Temp: 36.4 C  Resp:     Post vital signs: Reviewed and stable  Level of consciousness: awake, alert  and oriented  Complications: No apparent anesthesia complications

## 2015-07-27 NOTE — Transfer of Care (Signed)
Immediate Anesthesia Transfer of Care Note  Patient: Kari Bullock  Procedure(s) Performed: Procedure(s): VIDEO BRONCHOSCOPY WITH ENDOBRONCHIAL ULTRASOUND (N/A)  Patient Location: PACU  Anesthesia Type:General  Level of Consciousness: awake and alert   Airway & Oxygen Therapy: Patient Spontanous Breathing and Patient connected to nasal cannula oxygen  Post-op Assessment: Report given to RN, Post -op Vital signs reviewed and stable and Patient moving all extremities X 4  Post vital signs: Reviewed and stable  Last Vitals:  Filed Vitals:   07/27/15 1140  BP: 161/51  Pulse: 61  Temp: 36.4 C  Resp: 18    Complications: No apparent anesthesia complications

## 2015-07-27 NOTE — Discharge Instructions (Signed)
Dr. Prescott Gum talked to family states to them the patient may cough up a little blood. Results will be called by Dr. Prescott Gum in a couple of days.  Make sure patient rests today.

## 2015-07-27 NOTE — Anesthesia Preprocedure Evaluation (Addendum)
Anesthesia Evaluation  Patient identified by MRN, date of birth, ID band Patient awake    Reviewed: Allergy & Precautions, NPO status , Patient's Chart, lab work & pertinent test results  Airway Mallampati: II  TM Distance: >3 FB Neck ROM: full    Dental  (+) Edentulous Upper, Edentulous Lower, Dental Advisory Given   Pulmonary former smoker,  Lung CA.   breath sounds clear to auscultation       Cardiovascular hypertension,  Rhythm:regular Rate:Normal     Neuro/Psych  Headaches,    GI/Hepatic GERD  ,  Endo/Other    Renal/GU      Musculoskeletal   Abdominal   Peds  Hematology   Anesthesia Other Findings   Reproductive/Obstetrics                            Anesthesia Physical Anesthesia Plan  ASA: III  Anesthesia Plan: General   Post-op Pain Management:    Induction: Intravenous  Airway Management Planned: Oral ETT  Additional Equipment:   Intra-op Plan:   Post-operative Plan: Extubation in OR  Informed Consent: I have reviewed the patients History and Physical, chart, labs and discussed the procedure including the risks, benefits and alternatives for the proposed anesthesia with the patient or authorized representative who has indicated his/her understanding and acceptance.     Plan Discussed with: CRNA, Anesthesiologist and Surgeon  Anesthesia Plan Comments:         Anesthesia Quick Evaluation

## 2015-07-27 NOTE — Anesthesia Procedure Notes (Signed)
Procedure Name: Intubation Date/Time: 07/27/2015 2:20 PM Performed by: Garrison Columbus T Pre-anesthesia Checklist: Patient identified, Emergency Drugs available, Suction available and Patient being monitored Patient Re-evaluated:Patient Re-evaluated prior to inductionOxygen Delivery Method: Circle system utilized Preoxygenation: Pre-oxygenation with 100% oxygen Intubation Type: IV induction Ventilation: Mask ventilation without difficulty Laryngoscope Size: Miller and 2 Grade View: Grade I Tube type: Oral Tube size: 8.5 mm Number of attempts: 1 Airway Equipment and Method: Stylet and LTA kit utilized Placement Confirmation: ETT inserted through vocal cords under direct vision,  positive ETCO2 and breath sounds checked- equal and bilateral Secured at: 20 cm Tube secured with: Tape Dental Injury: Teeth and Oropharynx as per pre-operative assessment

## 2015-07-27 NOTE — Progress Notes (Signed)
The patient was examined and preop studies reviewed. There has been no change from the prior exam and the patient is ready for surgery.   Plan bronchoscopy and EBUS biopsy on [address] [place] today

## 2015-07-28 ENCOUNTER — Encounter (HOSPITAL_COMMUNITY): Payer: Self-pay | Admitting: Cardiothoracic Surgery

## 2015-07-28 NOTE — Op Note (Signed)
NAME:  DIMONIQUE, BOURDEAU NO.:  [id number]  MEDICAL RECORD NO.:  [id number]  LOCATION:  [facility]                         FACILITY:  [facility]  PHYSICIAN:  Ivin Poot, M.D.  DATE OF BIRTH:  [birth year]  DATE OF PROCEDURE:  07/27/2015 DATE OF DISCHARGE:                              OPERATIVE REPORT   PROCEDURE:  Fiberoptic video bronchoscopy, endobronchial ultrasound- guided transbronchial biopsy of level 7 mediastinal lymph node - subcarinal lymph node.  SURGEON:  Ivin Poot, M.D.  ANESTHESIA:  General.  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  On her most recent CT scan, there was a significant enlargement of some subcarinal lymph nodes, which had been previously biopsied 6 months ago and were negative.  Prior to initiating therapy, re-biopsy of the subcarinal level 7 lymph node station was recommended by the Multidisciplinary Thoracic Tumor Conference.  I discussed the procedure in detail with the patient including the risks of bleeding.  She understood and agreed to proceed under what I felt was an informed consent.  DESCRIPTION OF PROCEDURE:  The patient was brought to the operating room and placed supine on the operating room table where general anesthesia was induced.  Through the endotracheal tube, a video scope was passed. There were no endobronchial lesions.  There was some inflammation in the right lung in the middle lobe takeoff of the bronchus intermedius. Washings were taken.  The left-sided endobronchial segments were visualized and were normal.  The video bronchoscope was removed.  The EBUS scope was then passed.  The level 7 subcarinal lymph node station was visualized.  7 transbronchial needle aspirates of this were taken and submitted for Cytology.  Scope was removed and there was no active bleeding.  The patient was  reversed from anesthesia and returned to recovery room in stable condition.     Ivin Poot, M.D.     PV/MEDQ  D:  07/27/2015  T:  07/28/2015  Job:  [id number]  cc:   Ancil Linsey, MD

## 2015-07-29 ENCOUNTER — Encounter: Payer: Self-pay | Admitting: *Deleted

## 2015-07-29 NOTE — Progress Notes (Signed)
Oncology Nurse Navigator Documentation  Oncology Nurse Navigator Flowsheets 07/29/2015  Referral date to RadOnc/MedOnc 07/29/2015  Navigator Encounter Type Other/I received a referral on Ms. Lovena Le today for [facility].  Referral made.  [facility] will schedule  Treatment Phase Abnormal Scans  Interventions Coordination of Care  Coordination of Care MD Appointments  Time Spent with Patient 30

## 2015-08-05 ENCOUNTER — Ambulatory Visit (HOSPITAL_COMMUNITY): Payer: Medicare HMO | Admitting: Hematology & Oncology

## 2015-08-06 ENCOUNTER — Encounter (HOSPITAL_COMMUNITY): Payer: Medicare HMO | Attending: Hematology & Oncology | Admitting: Hematology & Oncology

## 2015-08-06 ENCOUNTER — Encounter (HOSPITAL_COMMUNITY): Payer: Self-pay | Admitting: Hematology & Oncology

## 2015-08-06 VITALS — BP 115/58 | HR 68 | Temp 97.4°F | Resp 16 | Wt 168.2 lb

## 2015-08-06 DIAGNOSIS — Z0000 Encounter for general adult medical examination without abnormal findings: Secondary | ICD-10-CM

## 2015-08-06 DIAGNOSIS — C342 Malignant neoplasm of middle lobe, bronchus or lung: Secondary | ICD-10-CM | POA: Diagnosis not present

## 2015-08-06 DIAGNOSIS — Z853 Personal history of malignant neoplasm of breast: Secondary | ICD-10-CM

## 2015-08-06 DIAGNOSIS — Z23 Encounter for immunization: Secondary | ICD-10-CM

## 2015-08-06 MED ORDER — INFLUENZA VAC SPLIT QUAD 0.5 ML IM SUSY
0.5000 mL | PREFILLED_SYRINGE | Freq: Once | INTRAMUSCULAR | Status: AC
  Administered 2015-08-06: 0.5 mL via INTRAMUSCULAR
  Filled 2015-08-06: qty 0.5

## 2015-08-06 NOTE — Patient Instructions (Signed)
[place] at [facility] Discharge Instructions  RECOMMENDATIONS MADE BY THE CONSULTANT AND ANY TEST RESULTS WILL BE SENT TO YOUR REFERRING PHYSICIAN.  Exam and discussion by [place] Will give you your flu vaccine today Will make a referral to Radiation Oncology and they will call you with the day and time of your appointment.  We will see you back in 2 weeks for further discussion of treatment.   Thank you for choosing [place] at [facility] to provide your oncology and hematology care.  To afford each patient quality time with our provider, please arrive at least 15 minutes before your scheduled appointment time.    You need to re-schedule your appointment should you arrive 10 or more minutes late.  We strive to give you quality time with our providers, and arriving late affects you and other patients whose appointments are after yours.  Also, if you no show three or more times for appointments you may be dismissed from the clinic at the providers discretion.     Again, thank you for choosing [facility].  Our hope is that these requests will decrease the amount of time that you wait before being seen by our physicians.       _____________________________________________________________  Should you have questions after your visit to [facility], please contact our office at [phone] between the hours of 8:30 a.m. and 4:30 p.m.  Voicemails left after 4:30 p.m. will not be returned until the following business day.  For prescription refill requests, have your pharmacy contact our office.

## 2015-08-06 NOTE — Progress Notes (Signed)
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 ER +90%, PR 30% Her -2 neu indeterminate. Surgery, XRT, hormonal therapy. Intolerant of Tamoxifen, arimidex X 5 years completed 02/15/2005 CT abdomen 12/18/2014 6 mm RML pulmonary nodule, 17 mm nodule in LL Base was 28m previously PET/CT 7 mm RML nodule hypermetabolic, hypermetabolic activity along the posterior inferior margin of the R mainstem bronchus, non-enlarged subcarinal LN EBUS 01/19/2015 with Dr. VPrescott Gum Lung, core biopsy RML on 07/15/2015 biopsy positive for poorly differentiated carcinoma, staining pattern favors poorly differentiated squamous cell carcinoma.  Wang needle FNA 7 Node #2, no malignant cells  CURRENT THERAPY: Work-up for newly discovered pulmonary nodule. EBUS 01/19/2015 with Dr. VPrescott Gum INTERVAL HISTORY: BSHARVI MOONEYHAN89y.o. female returns today for additional f/u of a recently found 7 mm right middle lobe nodule that is hypermetabolic on PET imaging with a hypermetabolic subcarinal lymph node concerning for malignancy found incidentally after a CT abdomen was performed by GI for periumbilical pain, diarrhea, and weight loss x 1 month.  [facility] the right breast, grade 2 (T1 A., N1 a) with 2 of 4 positive lymph nodes, ER +90%, PR +30%, lymph node metastases were 1 mm or slightly less. HER-2/neu was indeterminant at 2+. She was diagnosed in February 2001 treated with surgery followed by radiation therapy and then hormonal therapy. She could not tolerate tamoxifen so was switched to Arimidex which she took for total 5 years ending on 02/15/2005, no evidence of disease.  Pathology from the LN biopsy was negative for malignancy, bronchial washings showed abnormal cells. Biopsy on 10/27/2/016  of the RML was positive for poorly differentiated carcinoma, with staining pattern c/w squamous cell carcinoma. She states Dr. VPrescott Gumdiscussed XRT with her and not surgery.   She is up to date on her mammograms, her last one being done last Friday, 01/30/2015.   Quit smoking in 2001, was previously smoking less than a pack a day.  Mrs. TLeberis accompanied by her husband today. [place] [facility] She has received a flu shot this year.   She currently denies change in appetite or energy level. No nausea and vomiting , constipation, or diarrhea.    Past Medical History  Diagnosis Date  . Hypertension   . Breast cancer [place]     [place] RT breat lumpectomy rad tx  . GERD (gastroesophageal reflux disease)   . Memory loss   . Pure hypercholesterolemia   . Memory change 10/24/2013  . Infiltrating ductal carcinoma of right breast ([place] 01/09/2015  . Pulmonary nodule 01/09/2015  . Headache     years ago, none since hysterectomy  . Anemia     in the past  . Diarrhea   . Complication of anesthesia     confusion  . [place]     has Memory change; Periumbilical pain; Diarrhea; GERD (gastroesophageal reflux disease); Infiltrating ductal carcinoma of right breast ([place]; Pulmonary nodule; Diverticulosis of colon without hemorrhage; Chronic diarrhea; Mediastinal adenopathy; and Mucosal abnormality of stomach on her problem list.     is allergic to codeine; morphine and related; adhesive; sulfur; and tamoxifen.  Ms. TMoeserdoes not currently have medications on file.  Past  Surgical History  Procedure Laterality Date  . Abdominal hysterectomy    . Bilateral oophorectomy  2007  . Incision and drainage of wound  2011    buttocks  . Tonsillectomy    . Cataract extraction Bilateral     lens implant  . Carpal tunnel release Right   . Colonoscopy  2011    RMR: 1. Anal papilla, otherwise normal rectum. 2. Left-sided diverticula, single  diminutive polyp in the sigmoid segment status post cold biospy removal. Remainder of the colonic mucosa appeared normal.   . Colonoscopy N/A 01/14/2015    Procedure: COLONOSCOPY;  Surgeon: Daneil Dolin, MD;  [facility];  Service: Endoscopy;  Laterality: N/A;  930 - moved to 10:15  . Esophagogastroduodenoscopy N/A 01/14/2015    RMR: Colonic diverticulosis. status post segmental biospy and stool sampling.  No egd done today  . Breast surgery Right     right lumpectomy, lymph glands remove  . Video bronchoscopy with endobronchial ultrasound N/A 01/19/2015    Procedure: VIDEO BRONCHOSCOPY WITH ENDOBRONCHIAL ULTRASOUND;  Surgeon: Ivin Poot, MD;  Location: [facility] OR;  Service: Thoracic;  Laterality: N/A;  . Esophagogastroduodenoscopy N/A 02/11/2015    RMR: Focally abnormal antrum of doubtful clinical significance- Status post biopsy.   . Video bronchoscopy with endobronchial ultrasound N/A 07/27/2015    Procedure: VIDEO BRONCHOSCOPY WITH ENDOBRONCHIAL ULTRASOUND;  Surgeon: Ivin Poot, MD;  Location: [facility] OR;  Service: Thoracic;  Laterality: N/A;    Denies any headaches, dizziness, double vision, fevers, chills, night sweats, nausea, vomiting, diarrhea, constipation, chest pain, heart palpitations, shortness of breath, blood in stool, black tarry stool, urinary pain, urinary burning, urinary frequency, hematuria, appetite loss.  Positive for stomach pain.     She reports it as being a burning sensation.  14 point review of systems was performed and is negative except as detailed under history of present illness and above    PHYSICAL EXAMINATION  ECOG PERFORMANCE STATUS: 1 - Symptomatic but completely ambulatory  Filed Vitals:   08/06/15 1143  BP: 115/58  Pulse: 68  Temp: 97.4 F (36.3 C)  Resp: 16    GENERAL:alert, no distress, well nourished, well developed, comfortable, cooperative, obese, smiling and accompanied by her daughter. SKIN: skin color, texture, turgor are  normal, no rashes or significant lesions HEAD: Normocephalic, No masses, lesions, tenderness or abnormalities EYES: normal, PERRLA, EOMI, Conjunctiva are pink and non-injected EARS: External ears normal OROPHARYNX:lips, buccal mucosa, and tongue normal and mucous membranes are moist  NECK: supple, no adenopathy, thyroid normal size, non-tender, without nodularity, trachea midline LYMPH:  no palpable lymphadenopathy BREAST:not examined LUNGS: clear to auscultation  HEART: regular rate & rhythm, no murmurs, no gallops, S1 normal and S2 normal ABDOMEN:abdomen soft, non-tender, obese and normal bowel sounds BACK: Back symmetric, no curvature., No CVA tenderness EXTREMITIES:less then 2 second capillary refill, no joint deformities, effusion, or inflammation, no skin discoloration, no clubbing, no cyanosis  NEURO: alert & oriented x 3 with fluent speech, no focal motor/sensory deficits, gait normal   LABORATORY DATA: I have reviewed the data as listed  CBC    Component Value Date/Time   WBC 5.3 07/15/2015 0945   RBC 4.86 07/15/2015 0945   HGB 14.3 07/27/2015 1220   HCT 42.0 07/27/2015 1220   PLT 163 07/15/2015 0945   MCV 87.9 07/15/2015 0945   MCH 28.6 07/15/2015 0945   MCHC 32.6 07/15/2015 0945   RDW 13.8 07/15/2015 0945   LYMPHSABS 1.9 01/11/2015 1145  MONOABS 0.7 01/11/2015 1145   EOSABS 0.2 01/11/2015 1145   BASOSABS 0.1 01/11/2015 1145      Chemistry      Component Value Date/Time   NA 140 07/27/2015 1220   K 3.7 07/27/2015 1220   CL 102 01/19/2015 0817   CO2 24 01/19/2015 0817   BUN 13 01/19/2015 0817   CREATININE 0.80 03/19/2015 1109   CREATININE 0.69 12/16/2014 1058      Component Value Date/Time   CALCIUM 10.5* 01/19/2015 0817   ALKPHOS 60 01/19/2015 0817   AST 22 01/19/2015 0817   ALT 18 01/19/2015 0817   BILITOT 1.1 01/19/2015 0817     RADIOGRAPHIC STUDIES:  Study Result     CLINICAL DATA: F/U RT LUNG NODULE CXR 06/16 PACS 26 LBS WT LOSS SINCE  01/16 HX OF RT BREAST CA , LUMPECTOMY AND XRT 2001 EX SMOKER 2001^64m ISOVUE-300 IOPAMIDOL (ISOVUE-300) INJECTION 61%  EXAM: CT CHEST WITH CONTRAST  TECHNIQUE: Multidetector CT imaging of the chest was performed during intravenous contrast administration.  [place] [facility] [place] [facility] [address]  Results: Creatinine 0.7 mg/dL.  CONTRAST: 765mISOVUE-300 IOPAMIDOL (ISOVUE-300) INJECTION 61%  COMPARISON: PET-CT 12/31/2014  FINDINGS: Mediastinum/Lymph Nodes: Enlargement of subcarinal lymph node measuring at least 17 mm short axis diameter, previous less than 1 cm. Stable subcentimeter prevascular and precarinal lymph nodes. Normal vascular enhancement. No pericardial effusion.  Lungs/Pleura: No pleural effusion. 8 mm lingular nodule image 35/6, previously 7 mm. 12 mm nodule, anterior basal segment left lower lobe, stable. 15 mm right middle lobe nodule image 41, previously 7 mm. No new pulmonary nodule. Streaky scarring in the right apex probably related to radiation therapy. Emphysematous changes in both upper lobes.  Upper abdomen: Visualized segments unremarkable , adrenal glands incompletely visualized.  Musculoskeletal: Spondylitic changes in the lower thoracic spine and lower cervical spine. Sternum intact.  IMPRESSION: 1. Significant interval enlargement of right middle lobe 15 mm nodule (previously 7 mm) and 17 mm subcarinal lymph node. 2. Stable lingular and anterior left lower lobe nodules.   Electronically Signed  By: D Lucrezia Europe.D.  On: 06/30/2015 11:55    Dg Chest 1 View  07/15/2015  CLINICAL DATA:  Status post biopsy of a right middle lobe nodule. EXAM: CHEST 1 VIEW COMPARISON:  CT scan of today's date and frontal chest x-ray of March 03, 2015. FINDINGS: The lungs are well-expanded. There is no pneumothorax or pneumomediastinum. There is no pleural effusion. The left lung is clear. The heart and  pulmonary vascularity are normal. Coarse infrahilar lung markings on the right remain. IMPRESSION: There is no postprocedure pneumothorax following CT directed needle biopsy today. Electronically Signed   By: David  JoMartinique.D.   On: 07/15/2015 15:07   Dg Chest 2 View  07/27/2015  CLINICAL DATA:  [age]ear old female preop study for bronchoscopy. Right middle lobe lung cancer. Subsequent encounter. EXAM: CHEST  2 VIEW COMPARISON:  07/15/2015 radiograph. CT-guided right lung biopsy images 07/15/2015, and earlier FINDINGS: Stable lung volumes. Stable cardiac size and mediastinal contours. No pneumothorax, pulmonary edema, pleural effusion or acute pulmonary opacity. Indistinct nodular anterior middle lobe opacity on the right appears stable. No acute osseous abnormality identified. IMPRESSION: Stable since the post CT biopsy radiograph. No new cardiopulmonary abnormality. Electronically Signed   By: H Genevie Ann.D.   On: 07/27/2015 12:15   Ct Biopsy  07/15/2015  CLINICAL DATA:  [age]ear old female with a history of lung nodules. Right middle lobe nodule targeted  for biopsy. EXAM: CT-GUIDED BIOPSY RIGHT MIDDLE LOBE LUNG NODULE MEDICATIONS AND MEDICAL HISTORY: Versed 1.0 mg, Fentanyl 50 mcg. Additional Medications: None. ANESTHESIA/SEDATION: Moderate sedation time: 12 minutes PROCEDURE: The procedure, risks, benefits, and alternatives were explained to the patient and the patient's family. Specific risks that were addressed included bleeding, infection, pneumothorax, need for further procedure including chest tube placement, chance of delayed pneumothorax or hemorrhage, hemoptysis, nondiagnostic sample, cardiopulmonary collapse, death. Questions regarding the procedure were encouraged and answered. The patient understands and consents to the procedure. Patient was positioned in the supine position on the CT gantry table and a scout CT of the chest was performed for planning purposes. Once angle of approach was  determined, the skin and subcutaneous tissues this scan was prepped and draped in the usual sterile fashion, and a sterile drape was applied covering the operative field. A sterile gown and sterile gloves were used for the procedure. Local anesthesia was provided with 1% Lidocaine. The skin and subcutaneous tissues were infiltrated 1% lidocaine for local anesthesia, and a small stab incision was made with an 11 blade scalpel. Using CT guidance, a 17 gauge trocar needle was advanced into the right middle lobetarget. After confirmation of the tip, separate 18 gauge core biopsies were performed. These were placed into solution for transportation to the lab. A BioSentry device was deployed. A final CT image was performed. Patient tolerated the procedure well and remained hemodynamically stable throughout. No complications were encountered and no significant blood loss was encounter Patient tolerated the procedure well and remained hemodynamically stable throughout. No complications were encountered and no significant blood loss was encountered. FINDINGS: The images document guide needle placement within the right middle lobe nodule. Post biopsy images demonstrate small amount of expected hemorrhage and no pneumothorax. COMPLICATIONS: None IMPRESSION: Status post right middle lobe lung nodule biopsy. Tissue specimen sent to pathology for complete histopathologic analysis. Signed, Dulcy Fanny. Earleen Newport, DO [facility] Electronically Signed   By: Corrie Mckusick D.O.   On: 07/15/2015 13:03   Dg Chest Port 1 View  07/27/2015  CLINICAL DATA:  History of breast carcinoma status post media bronchoscopy/lung biopsy EXAM: PORTABLE CHEST 1 VIEW COMPARISON:  Chest CT June 30, 2015; chest radiograph obtained earlier in the day FINDINGS: There is no apparent pneumothorax. The nodular lesion in the right middle lobe measuring 1.5 x 1.2 cm remain stable. No new opacity. Heart  size and pulmonary vascularity are normal. No adenopathy is appreciable by radiography ; the sub- carinal adenopathy seen on recent CT is not appreciable on this portable radiograph. There is atherosclerotic calcification in the aorta. There are surgical clips in the right axillary region. IMPRESSION: Stable nodular opacity in the right middle lobe region. No pneumothorax. No new opacity. No change in cardiac silhouette. Electronically Signed   By: Lowella Grip III M.D.   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  She has a lot of questions regarding radiation and I advised her that these would best be answered by her radiation oncologist.  Currently, there is no indication for chemotherapy and we did discuss this in detail.  We addressed the potential that she could "fail" within the chest or outside of the chest in the future, but current guidelines do not indicate a definitive role for chemotherapy.    I spent time explaining to the patient that this is not a recurrence of her breast cancer. She seems confused by this at times.  All questions were answered. The patient knows to call the clinic with any problems, questions or concerns. We can certainly see the patient much sooner if necessary.  This document serves as a record of services personally performed by Ancil Linsey, MD. It was created on her behalf by Toni Amend, a trained medical scribe. The creation of this record is based on the scribe's personal observations and  the provider's statements to them. This document has been checked and approved by the attending provider.  I have reviewed the above documentation for accuracy and completeness, and I agree with the above. Molli Hazard, MD

## 2015-08-06 NOTE — Progress Notes (Signed)
NECOLE MINASSIAN presents today for injection per MD orders. Flu vaccine administered IM in left deltoid. Administration without incident. Patient tolerated well.

## 2015-08-16 NOTE — Progress Notes (Signed)
Thoracic Location of Tumor / Histology: Infiltrating ductal carcinoma   Kari Bullock presented with "stomach issues" and months ago and during her workup was discovered a have lung noduel in the right middle lobe.  Biopsies of (if applicable) revealed:     Tobacco/Marijuana/Snuff/ETOH use: Former smoker  Past/Anticipated interventions by cardiothoracic surgery, if any: 07/27/15: Fiberoptic video bronchoscopy, endobronchial ultrasound- guided transbronchial biopsy of level 7 mediastinal lymph node - subcarinal lymph node  Past/Anticipated interventions by medical oncology, if any: Next appointment with Dr. Whitney Muse 08/26/15  Signs/Symptoms  Weight changes, if any: None  Respiratory complaints, if any: O2 sat 97%. Denies any SOB with her normal activity  Hemoptysis, if any: No  Pain issues, if any:  1- 5 intermittent pain in the left, posterior, lower left lung which she has experienced for the past "several months"  SAFETY ISSUES:  Prior radiation? Yes- history of Right Breast Cancer  Pacemaker/ICD? No   Possible current pregnancy?No  Is the patient on methotrexate? 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 ER +90%, PR 30% Her -2 neu indeterminate. Surgery, XRT, hormonal therapy. Intolerant of Tamoxifen, arimidex X 5 years completed 02/15/2005

## 2015-08-18 ENCOUNTER — Ambulatory Visit
Admission: RE | Admit: 2015-08-18 | Discharge: 2015-08-18 | Disposition: A | Discharge status: Home / Self Care | Payer: Medicare HMO | Source: Ambulatory Visit | Attending: Radiation Oncology | Admitting: Radiation Oncology

## 2015-08-18 ENCOUNTER — Encounter: Payer: Self-pay | Admitting: Radiation Oncology

## 2015-08-18 VITALS — BP 131/55 | HR 63 | Temp 97.7°F | Ht 63.0 in | Wt 169.1 lb

## 2015-08-18 DIAGNOSIS — C50911 Malignant neoplasm of unspecified site of right female breast: Secondary | ICD-10-CM

## 2015-08-18 DIAGNOSIS — Z79818 Long term (current) use of other agents affecting estrogen receptors and estrogen levels: Secondary | ICD-10-CM | POA: Diagnosis not present

## 2015-08-18 DIAGNOSIS — Z923 Personal history of irradiation: Secondary | ICD-10-CM | POA: Diagnosis not present

## 2015-08-18 DIAGNOSIS — Z853 Personal history of malignant neoplasm of breast: Secondary | ICD-10-CM | POA: Diagnosis present

## 2015-08-18 DIAGNOSIS — Z170 Estrogen receptor positive status [ER+]: Secondary | ICD-10-CM | POA: Insufficient documentation

## 2015-08-18 DIAGNOSIS — Z510 Encounter for antineoplastic radiation therapy: Secondary | ICD-10-CM | POA: Insufficient documentation

## 2015-08-18 DIAGNOSIS — C3490 Malignant neoplasm of unspecified part of unspecified bronchus or lung: Secondary | ICD-10-CM | POA: Diagnosis present

## 2015-08-18 NOTE — Progress Notes (Signed)
Radiation Oncology         [id number]) [phone] ________________________________  Initial outpatient Consultation - Date: 08/18/2015   Name: Kari Bullock MRN: [id number]   DOB: 08/18/1941  REFERRING PHYSICIAN: Patrici Ranks, MD  DIAGNOSIS AND STAGE: Stage I Lung cancer with a history of Stage II breast cancer treated with lumpectomy, radiation and anti-estrogen therapy at [facility] is a 74 y.o. female  who has a history of Stage II breast cancer. A recent CT scan showed an enlarging right middle lobe nodule as well as a large 17 mm subcarinal lymph node. Biopsy of the right middle lobe nodule was positive for poorly differentiated squamous cell caricnoma on October 27th. Biopsy on November 8th of the subcarinal lymph node showed no malignant cells. She is not interested in surgery and is therefore referred for discussion of SBRT to this newly diagnosed right middle lobe nodule.  She is accompanied by her husband and daughter today. Her next appointment with Dr. Whitney Muse is on 08/26/15.    She has no respiratory symptoms at present. She performs all her ADLs.   PREVIOUS RADIATION THERAPY: Yes, right breast cancer in 2001 at [facility] with Dr. Valere Dross  Past medical, social and family history were reviewed in the electronic chart. Review of symptoms was reviewed in the electronic chart. Medications were reviewed in the electronic chart.   PHYSICAL EXAM: Vitals with BMI 08/18/2015  Height '5\' 3"'$   Weight 169 lbs 2 oz  BMI 30  Systolic 213  Diastolic 55  Pulse 95  Respirations    Pleasant female in no distress. Normal respiratory effort. Alert and oriented x3.   IMPRESSION: Stage I lung cancer with a history of Stage II breast cancer treated with lumpectomy, radiation and anti-estrogen therapy at [facility]  PLAN: We discussed that she has stage I lung cancer and the results of SBRT with provide a greater than 90% local control. We discussed  that she may fail in the mediastinum hilum or elsewhere in the body and radiation was a local only process. We discussed the process of simulation and the use of a pad all to decrease respiratory motion. We discussed the use of 4-dimensional simulation to minimize normal lung tissue treated and the use of respiratory compression. We discussed 3- 5 treatments occurring every other day as an outpatient. We discussed these treatments will last about 10-20 minutes and she would be here at the hospital for about an hour. We discussed that SBRT was unlikely to make his breathing any worse. It is also unlikely to make her breathing symptoms any better. We discussed possible side effects including shoulder pain due to arm positioning and possible rib fracture withthe pleural-based nodules proximity to the ribs. We discussed damage to other critical normal structures including heart, ribs, lung collapse, chronic cough, and brachial plexus injury. We discussed that without treatment this  could develop into a more aggressive or even metastatic cancer.   She has been scheduled for simulation next Tuesday, 12/6, at 1 pm. She has a follow up with Dr. Whitney Muse next week as well.   Informed consent was signed.   I spent 40 minutes face to face with the patient and more than 50% of that time was spent in counseling and/or coordination of care.   ------------------------------------------------  Thea Silversmith, MD  This document serves as a record of services personally performed by Thea Silversmith, MD. It was created on her behalf by Arlyce Harman, a  trained medical scribe. The creation of this record is based on the scribe's personal observations and the provider's statements to them. This document has been checked and approved by the attending provider.

## 2015-08-24 ENCOUNTER — Ambulatory Visit
Admission: RE | Admit: 2015-08-24 | Discharge: 2015-08-24 | Disposition: A | Discharge status: Home / Self Care | Payer: Medicare HMO | Source: Ambulatory Visit | Attending: Radiation Oncology | Admitting: Radiation Oncology

## 2015-08-24 ENCOUNTER — Ambulatory Visit: Payer: Medicare HMO | Admitting: Radiation Oncology

## 2015-08-24 DIAGNOSIS — C50911 Malignant neoplasm of unspecified site of right female breast: Secondary | ICD-10-CM

## 2015-08-24 DIAGNOSIS — Z510 Encounter for antineoplastic radiation therapy: Secondary | ICD-10-CM | POA: Diagnosis not present

## 2015-08-24 NOTE — Progress Notes (Signed)
[facility] Radiation Oncology Simulation and Treatment Planning Note   Name: Kari Bullock MRN: [id number]  Date: 08/24/2015  DOB: [birth year]   DIAGNOSIS: There were no encounter diagnoses.  CONSENT VERIFIED: yes  SET UP AND IMMOBILIZATION: Patient is setup supine in a vac loc with a custom moldable pillow for head and neck immobilization   NARRATIVE: The patient was brought to the [facility].  Identity was confirmed.  All relevant records and images related to the planned course of therapy were reviewed.  Then, the patient was positioned in a stable reproducible clinical set-up for radiation therapy.  Abdominal compression was applied. CT images were obtained.  Skin markings were placed.  A four dimensional simulation was then performed to track tumor movement throughout the patients' breathing cycle. The CT images were loaded into the planning software where the target and avoidance structures were contoured.  The GTV was outlined on the free breathing, 4D and MIP image sets.  The radiation prescription was entered and confirmed.   TREATMENT PLANNING NOTE:  Treatment planning then occurred. I have requested 3D simulation with [facility] of the spinal cord, total lungs and gross tumor volume. I have also requested mlcs and an isodose plan.   Special treatment procedure will be performed as Kari Bullock will be receiving high dose per fraction.    We have requested her previous treatment records.

## 2015-08-26 ENCOUNTER — Encounter (HOSPITAL_COMMUNITY): Payer: Medicare HMO | Attending: Hematology & Oncology | Admitting: Hematology & Oncology

## 2015-08-26 ENCOUNTER — Encounter (HOSPITAL_COMMUNITY): Payer: Self-pay | Admitting: Hematology & Oncology

## 2015-08-26 VITALS — BP 123/59 | HR 64 | Temp 97.5°F | Resp 16 | Wt 167.2 lb

## 2015-08-26 DIAGNOSIS — Z87891 Personal history of nicotine dependence: Secondary | ICD-10-CM

## 2015-08-26 DIAGNOSIS — C342 Malignant neoplasm of middle lobe, bronchus or lung: Secondary | ICD-10-CM

## 2015-08-26 DIAGNOSIS — Z853 Personal history of malignant neoplasm of breast: Secondary | ICD-10-CM

## 2015-08-26 NOTE — Patient Instructions (Signed)
[place] at [facility] Discharge Instructions  RECOMMENDATIONS MADE BY THE CONSULTANT AND ANY TEST RESULTS WILL BE SENT TO YOUR REFERRING PHYSICIAN.   Exam completed by Dr Whitney Muse today Return to see the doctor at the end of the January At this visit we will see when we will do the scans Please call the clinic if you have any questions or concerns     Thank you for choosing [place] at [facility] to provide your oncology and hematology care.  To afford each patient quality time with our provider, please arrive at least 15 minutes before your scheduled appointment time.    You need to re-schedule your appointment should you arrive 10 or more minutes late.  We strive to give you quality time with our providers, and arriving late affects you and other patients whose appointments are after yours.  Also, if you no show three or more times for appointments you may be dismissed from the clinic at the providers discretion.     Again, thank you for choosing [facility].  Our hope is that these requests will decrease the amount of time that you wait before being seen by our physicians.       _____________________________________________________________  Should you have questions after your visit to [facility], please contact our office at [phone] between the hours of 8:30 a.m. and 4:30 p.m.  Voicemails left after 4:30 p.m. will not be returned until the following business day.  For prescription refill requests, have your pharmacy contact our office.

## 2015-08-26 NOTE — Progress Notes (Signed)
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 ER +90%, PR 30% Her -2 neu indeterminate. Surgery, XRT, hormonal therapy. Intolerant of Tamoxifen, arimidex X 5 years completed 02/15/2005 CT abdomen 12/18/2014 6 mm RML pulmonary nodule, 17 mm nodule in LL Base was 40m previously PET/CT 7 mm RML nodule hypermetabolic, hypermetabolic activity along the posterior inferior margin of the R mainstem bronchus, non-enlarged subcarinal LN EBUS 01/19/2015 with Dr. VPrescott Gum Lung, core biopsy RML on 07/15/2015 biopsy positive for poorly differentiated carcinoma, staining pattern favors poorly differentiated squamous cell carcinoma.  Wang needle FNA 7 Node #2, no malignant cells   CURRENT THERAPY:  Patient scheduled for SBRT  INTERVAL HISTORY: Kari Bullock returns today for additional f/u of a recently found 7 mm right middle lobe nodule that is hypermetabolic on PET imaging with a hypermetabolic subcarinal lymph node concerning for malignancy found incidentally after a CT abdomen was performed by GI for periumbilical pain, diarrhea, and weight loss x 1 month.  [facility] the right breast, grade 2 (T1 A., N1 a) with 2 of 4 positive lymph nodes, ER +90%, PR +30%, lymph node metastases were 1 mm or slightly less. HER-2/neu was indeterminant at 2+. She was diagnosed in February 2001 treated with surgery followed by radiation therapy and then hormonal therapy. She could not tolerate tamoxifen so was switched to Arimidex which she took for total 5 years ending on 02/15/2005, no evidence of disease.  Pathology from the LN biopsy was negative for malignancy, bronchial washings showed abnormal cells. Biopsy on 10/27/2/016 of the RML was positive for poorly  differentiated carcinoma, with staining pattern c/w squamous cell carcinoma.   She is up to date on her mammograms, her last one being done last Friday, 01/30/2015.   Quit smoking in 2001, was previously smoking less than a pack a day.  Mrs. TStandishis accompanied by her husband today. She is scheduled to begin radiation therapy with [name] December 21st. She will receive three total treatments.   She has received a flu shot this year.   She currently denies change in appetite or energy level. No nausea and vomiting , constipation, or diarrhea.   Past Medical History  Diagnosis Date  . Hypertension   . Breast cancer ([place]     [place] RT breat lumpectomy rad tx  . GERD (gastroesophageal reflux disease)   . Memory loss   . Pure hypercholesterolemia   . Memory change 10/24/2013  . [place] 01/09/2015  . Pulmonary nodule 01/09/2015  . Headache     years ago, none since hysterectomy  . Anemia     in the past  . Diarrhea   . Complication of anesthesia     confusion  . Lung cancer ([place]     has Memory change; Periumbilical pain; Diarrhea; GERD (gastroesophageal reflux disease); Infiltrating ductal carcinoma of right breast ([place]; Pulmonary nodule; Diverticulosis of colon without hemorrhage; Chronic diarrhea; Mediastinal adenopathy; and Mucosal abnormality of stomach on her problem list.     is allergic to codeine; morphine and related; adhesive; sulfur; and tamoxifen.  Ms. THackworthdoes not currently have medications on file.  Past Surgical History  Procedure Laterality Date  . Abdominal  hysterectomy    . Bilateral oophorectomy  2007  . Incision and drainage of wound  2011    buttocks  . Tonsillectomy    . Cataract extraction Bilateral     lens implant  . Carpal tunnel release Right   . Colonoscopy  2011    RMR: 1. Anal papilla, otherwise normal rectum. 2. Left-sided diverticula, single diminutive polyp in the sigmoid segment  status post cold biospy removal. Remainder of the colonic mucosa appeared normal.   . Colonoscopy N/A 01/14/2015    Procedure: COLONOSCOPY;  Surgeon: Daneil Dolin, MD;  Location[facility];  Service: Endoscopy;  Laterality: N/A;  930 - moved to 10:15  . Esophagogastroduodenoscopy N/A 01/14/2015    RMR: Colonic diverticulosis. status post segmental biospy and stool sampling.  No egd done today  . Breast surgery Right     right lumpectomy, lymph glands remove  . Video bronchoscopy with endobronchial ultrasound N/A 01/19/2015    Procedure: VIDEO BRONCHOSCOPY WITH ENDOBRONCHIAL ULTRASOUND;  Surgeon: Ivin Poot, MD;  [facility] OR;  Service: Thoracic;  Laterality: N/A;  . Esophagogastroduodenoscopy N/A 02/11/2015    RMR: Focally abnormal antrum of doubtful clinical significance- Status post biopsy.   . Video bronchoscopy with endobronchial ultrasound N/A 07/27/2015    Procedure: VIDEO BRONCHOSCOPY WITH ENDOBRONCHIAL ULTRASOUND;  Surgeon: Ivin Poot, MD;  Location: [facility] OR;  Service: Thoracic;  Laterality: N/A;    Denies any headaches, dizziness, double vision, fevers, chills, night sweats, nausea, vomiting, diarrhea, constipation, chest pain, heart palpitations, shortness of breath, blood in stool, black tarry stool, urinary pain, urinary burning, urinary frequency, hematuria, appetite loss.  14 point review of systems was performed and is negative except as detailed under history of present illness and above    PHYSICAL EXAMINATION  ECOG PERFORMANCE STATUS: 1 - Symptomatic but completely ambulatory  Filed Vitals:   08/26/15 1115  BP: 123/59  Pulse: 64  Temp: 97.5 F (36.4 C)  Resp: 16    GENERAL:alert, no distress, well nourished, well developed, comfortable, cooperative, obese, smiling and accompanied by her daughter. SKIN: skin color, texture, turgor are normal, no rashes or significant lesions HEAD: Normocephalic, No masses, lesions, tenderness or abnormalities EYES:  normal, PERRLA, EOMI, Conjunctiva are pink and non-injected EARS: External ears normal OROPHARYNX:lips, buccal mucosa, and tongue normal and mucous membranes are moist  NECK: supple, no adenopathy, thyroid normal size, non-tender, without nodularity, trachea midline LYMPH:  no palpable lymphadenopathy BREAST:not examined LUNGS: clear to auscultation  HEART: regular rate & rhythm, no murmurs, no gallops, S1 normal and S2 normal ABDOMEN:abdomen soft, non-tender, obese and normal bowel sounds BACK: Back symmetric, no curvature., No CVA tenderness EXTREMITIES:less then 2 second capillary refill, no joint deformities, effusion, or inflammation, no skin discoloration, no clubbing, no cyanosis  NEURO: alert & oriented x 3 with fluent speech, no focal motor/sensory deficits, gait normal   LABORATORY DATA: I have reviewed the data as listed. CBC    Component Value Date/Time   WBC 5.3 07/15/2015 0945   RBC 4.86 07/15/2015 0945   HGB 14.3 07/27/2015 1220   HCT 42.0 07/27/2015 1220   PLT 163 07/15/2015 0945   MCV 87.9 07/15/2015 0945   MCH 28.6 07/15/2015 0945   MCHC 32.6 07/15/2015 0945   RDW 13.8 07/15/2015 0945   LYMPHSABS 1.9 01/11/2015 1145   MONOABS 0.7 01/11/2015 1145   EOSABS 0.2 01/11/2015 1145   BASOSABS 0.1 01/11/2015 1145      Chemistry  Component Value Date/Time   NA 140 07/27/2015 1220   K 3.7 07/27/2015 1220   CL 102 01/19/2015 0817   CO2 24 01/19/2015 0817   BUN 13 01/19/2015 0817   CREATININE 0.80 03/19/2015 1109   CREATININE 0.69 12/16/2014 1058      Component Value Date/Time   CALCIUM 10.5* 01/19/2015 0817   ALKPHOS 60 01/19/2015 0817   AST 22 01/19/2015 0817   ALT 18 01/19/2015 0817   BILITOT 1.1 01/19/2015 0817     RADIOGRAPHIC STUDIES:  Dg Chest 2 View  07/27/2015  CLINICAL DATA:  74 year old Bullock preop study for bronchoscopy. Right middle lobe lung cancer. Subsequent encounter. EXAM: CHEST  2 VIEW COMPARISON:  07/15/2015 radiograph. CT-guided  right lung biopsy images 07/15/2015, and earlier FINDINGS: Stable lung volumes. Stable cardiac size and mediastinal contours. No pneumothorax, pulmonary edema, pleural effusion or acute pulmonary opacity. Indistinct nodular anterior middle lobe opacity on the right appears stable. No acute osseous abnormality identified. IMPRESSION: Stable since the post CT biopsy radiograph. No new cardiopulmonary abnormality. Electronically Signed   By: Genevie Ann M.D.   On: 07/27/2015 12:15   Dg Chest Port 1 View  07/27/2015  CLINICAL DATA:  History of breast carcinoma status post media bronchoscopy/lung biopsy EXAM: PORTABLE CHEST 1 VIEW COMPARISON:  Chest CT June 30, 2015; chest radiograph obtained earlier in the day FINDINGS: There is no apparent pneumothorax. The nodular lesion in the right middle lobe measuring 1.5 x 1.2 cm remain stable. No new opacity. Heart size and pulmonary vascularity are normal. No adenopathy is appreciable by radiography ; the sub- carinal adenopathy seen on recent CT is not appreciable on this portable radiograph. There is atherosclerotic calcification in the aorta. There are surgical clips in the right axillary region. IMPRESSION: Stable nodular opacity in the right middle lobe region. No pneumothorax. No new opacity. No change in cardiac silhouette. Electronically Signed   By: Lowella Grip III M.D.   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 Pablo Ledger. She has no high risk features currently to qualify for incorporation  of chemotherapy. She understands that she could fail in the mediastinum, hilum, etc.   She will return the end of January for routine follow-up and to see how she has done with radiation treatment. We will discuss and schedule for repeat imaging at this next visit.   All questions were answered. The patient knows to call the clinic with any problems, questions or concerns. We can certainly see the patient much sooner if necessary.  This document serves as a record of services personally performed by Ancil Linsey, MD. It was created on her behalf by Arlyce Harman, a trained medical scribe. The creation of this record is based on the scribe's personal observations and the provider's statements to them. This document has been checked and approved by the attending provider.  I have reviewed the above documentation for accuracy and completeness, and I agree with the above. Molli Hazard, MD

## 2015-08-27 DIAGNOSIS — C342 Malignant neoplasm of middle lobe, bronchus or lung: Secondary | ICD-10-CM | POA: Insufficient documentation

## 2015-09-02 DIAGNOSIS — Z510 Encounter for antineoplastic radiation therapy: Secondary | ICD-10-CM | POA: Diagnosis not present

## 2015-09-08 ENCOUNTER — Ambulatory Visit
Admission: RE | Admit: 2015-09-08 | Discharge: 2015-09-08 | Disposition: A | Discharge status: Home / Self Care | Payer: Medicare HMO | Source: Ambulatory Visit | Attending: Radiation Oncology | Admitting: Radiation Oncology

## 2015-09-08 DIAGNOSIS — Z510 Encounter for antineoplastic radiation therapy: Secondary | ICD-10-CM | POA: Diagnosis not present

## 2015-09-10 ENCOUNTER — Ambulatory Visit
Admission: RE | Admit: 2015-09-10 | Discharge: 2015-09-10 | Disposition: A | Discharge status: Home / Self Care | Payer: Medicare HMO | Source: Ambulatory Visit | Attending: Radiation Oncology | Admitting: Radiation Oncology

## 2015-09-10 DIAGNOSIS — Z510 Encounter for antineoplastic radiation therapy: Secondary | ICD-10-CM | POA: Diagnosis not present

## 2015-09-15 ENCOUNTER — Ambulatory Visit
Admission: RE | Admit: 2015-09-15 | Discharge: 2015-09-15 | Disposition: A | Discharge status: Home / Self Care | Payer: Medicare HMO | Source: Ambulatory Visit | Attending: Radiation Oncology | Admitting: Radiation Oncology

## 2015-09-15 ENCOUNTER — Encounter: Payer: Self-pay | Admitting: Radiation Oncology

## 2015-09-15 VITALS — BP 158/71 | HR 68 | Temp 98.0°F | Resp 16 | Wt 168.5 lb

## 2015-09-15 DIAGNOSIS — C50911 Malignant neoplasm of unspecified site of right female breast: Secondary | ICD-10-CM

## 2015-09-15 DIAGNOSIS — Z510 Encounter for antineoplastic radiation therapy: Secondary | ICD-10-CM | POA: Diagnosis not present

## 2015-09-15 DIAGNOSIS — C342 Malignant neoplasm of middle lobe, bronchus or lung: Secondary | ICD-10-CM

## 2015-09-15 NOTE — Progress Notes (Signed)
Weight and vitals stable. Patient denies pain. Patient denies cough, shortness of breath, dyspnea, or difficulty swallowing. No skin changes noted within treatment field. Encouraged patient to continue to moisturize chest and upper back. Provided patient with one month follow up appointment card.   BP 158/71 mmHg  Pulse 68  Temp(Src) 98 F (36.7 C) (Oral)  Resp 16  Wt 168 lb 8 oz (76.431 kg)  SpO2 99% Wt Readings from Last 3 Encounters:  09/15/15 168 lb 8 oz (76.431 kg)  08/26/15 167 lb 3.2 oz (75.841 kg)  08/18/15 169 lb 1.6 oz (76.703 kg)

## 2015-09-15 NOTE — Progress Notes (Signed)
  Radiation Oncology         [phone] ________________________________  Name: Kari Bullock MRN: [id number]  Date: 09/15/2015  DOB: 23-Apr-1941  End of Treatment Note  Diagnosis:   Infiltrating ductal carcinoma of right breast ([place])   Staging form: Breast, AJCC 7th Edition     Clinical: Stage IIA (T1a, N1, M0) - Signed by Baird Cancer, PA-C on 01/09/2015      Indication for treatment:  Curative       Radiation treatment dates:   09/08/15-09/15/15  Site/dose:   Right middle lobe/ 54 Gy at 18 GY per fraction x 3 fractions.   Beams/energy:   VMAT with 6 MV photons and filter free mode  Narrative: The patient tolerated radiation treatment relatively well.   She had no ill effects from treamtent.   Plan: The patient has completed radiation treatment. The patient will return to radiation oncology clinic for routine followup in one month. I advised them to call or return sooner if they have any questions or concerns related to their recovery or treatment.  ------------------------------------------------  Thea Silversmith, MD

## 2015-09-19 DIAGNOSIS — C3490 Malignant neoplasm of unspecified part of unspecified bronchus or lung: Secondary | ICD-10-CM

## 2015-09-19 HISTORY — DX: Malignant neoplasm of unspecified part of unspecified bronchus or lung: C34.90

## 2015-09-30 DIAGNOSIS — K219 Gastro-esophageal reflux disease without esophagitis: Secondary | ICD-10-CM | POA: Diagnosis not present

## 2015-09-30 DIAGNOSIS — Z0389 Encounter for observation for other suspected diseases and conditions ruled out: Secondary | ICD-10-CM | POA: Diagnosis not present

## 2015-09-30 DIAGNOSIS — I10 Essential (primary) hypertension: Secondary | ICD-10-CM | POA: Diagnosis not present

## 2015-09-30 DIAGNOSIS — C50911 Malignant neoplasm of unspecified site of right female breast: Secondary | ICD-10-CM | POA: Diagnosis not present

## 2015-09-30 DIAGNOSIS — E559 Vitamin D deficiency, unspecified: Secondary | ICD-10-CM | POA: Diagnosis not present

## 2015-10-14 DIAGNOSIS — R0789 Other chest pain: Secondary | ICD-10-CM | POA: Diagnosis not present

## 2015-10-18 ENCOUNTER — Encounter (HOSPITAL_COMMUNITY): Payer: Medicare HMO | Attending: Hematology & Oncology | Admitting: Hematology & Oncology

## 2015-10-18 ENCOUNTER — Encounter (HOSPITAL_COMMUNITY): Payer: Self-pay | Admitting: Hematology & Oncology

## 2015-10-18 VITALS — BP 147/70 | HR 65 | Temp 97.7°F | Resp 18 | Wt 164.6 lb

## 2015-10-18 DIAGNOSIS — Z853 Personal history of malignant neoplasm of breast: Secondary | ICD-10-CM

## 2015-10-18 DIAGNOSIS — C342 Malignant neoplasm of middle lobe, bronchus or lung: Secondary | ICD-10-CM

## 2015-10-18 DIAGNOSIS — C50911 Malignant neoplasm of unspecified site of right female breast: Secondary | ICD-10-CM

## 2015-10-18 NOTE — Patient Instructions (Addendum)
[place] at [facility] Discharge Instructions  RECOMMENDATIONS MADE BY THE CONSULTANT AND ANY TEST RESULTS WILL BE SENT TO YOUR REFERRING PHYSICIAN.     Exam and discussion by Dr Whitney Muse today. Dr Pablo Ledger will order your first set of scans. We will coordinate care with her. February 2nd at 1:20pm you see Dr Pablo Ledger.   Return to see the doctor in 3 months. Please call the clinic if you have any questions or concerns.     Thank you for choosing [place] at [facility] to provide your oncology and hematology care.  To afford each patient quality time with our provider, please arrive at least 15 minutes before your scheduled appointment time.   Beginning January 23rd 2017 lab work for the [facility] will be done in the  Main lab at [facility] on 1st floor. If you have a lab appointment with the [facility] please come in thru the  Main Entrance and check in at the main information desk  You need to re-schedule your appointment should you arrive 10 or more minutes late.  We strive to give you quality time with our providers, and arriving late affects you and other patients whose appointments are after yours.  Also, if you no show three or more times for appointments you may be dismissed from the clinic at the providers discretion.     Again, thank you for choosing [facility].  Our hope is that these requests will decrease the amount of time that you wait before being seen by our physicians.       _____________________________________________________________  Should you have questions after your visit to [facility], please contact our office at [phone] between the hours of 8:30 a.m. and 4:30 p.m.  Voicemails left after 4:30 p.m. will not be returned until the following business day.  For prescription refill requests, have your pharmacy contact our office.

## 2015-10-18 NOTE — Progress Notes (Signed)
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 ER +90%, PR 30% Her -2 neu indeterminate. Surgery, XRT, hormonal therapy. Intolerant of Tamoxifen, arimidex X 5 years completed 02/15/2005 CT abdomen 12/18/2014 6 mm RML pulmonary nodule, 17 mm nodule in LL Base was 23m previously PET/CT 7 mm RML nodule hypermetabolic, hypermetabolic activity along the posterior inferior margin of the R mainstem bronchus, non-enlarged subcarinal LN EBUS 01/19/2015 with Dr. VPrescott Gum Lung, core biopsy RML on 07/15/2015 biopsy positive for poorly differentiated carcinoma, staining pattern favors poorly differentiated squamous cell carcinoma.  Wang needle FNA 7 Node #2, no malignant cells   CURRENT THERAPY:  Patient scheduled for SBRT  INTERVAL HISTORY: Kari [id number].o. female returns today for additional f/u of a recently found 7 mm right middle lobe nodule that is hypermetabolic on PET imaging with a hypermetabolic subcarinal lymph node concerning for malignancy found incidentally after a CT abdomen was performed by GI for periumbilical pain, diarrhea, and weight loss x 1 month.  [facility], grade 2 (T1 A., N1 a) with 2 of 4 positive lymph nodes, ER +90%, PR +30%, lymph node metastases were 1 mm or slightly less. HER-2/neu was indeterminant at 2+. She was diagnosed in February 2001 treated with surgery followed by radiation therapy and then hormonal therapy. She could not tolerate tamoxifen so was switched to Arimidex which she took for total 5 years ending on 02/15/2005, no evidence of disease.  Pathology from the LN biopsy was negative for malignancy, bronchial washings showed abnormal cells. Biopsy on 10/27/2/016 of the RML was positive for poorly  differentiated carcinoma, with staining pattern c/w squamous cell carcinoma.   She is up to date on her mammograms, her last one being done last Friday, 01/30/2015.   Quit smoking in 2001, was previously smoking less than a pack a day.  Kari Bullock accompanied by her husband today. She is scheduled for follow up with [name] Thursday, 2/2. She has completed SBRT.  She is eating better than she was previously.   Her new PCP is [name] She notes that he performed a heart study on her recently but she is not aware of the results.    Past Medical History  Diagnosis Date  . Hypertension   . Breast cancer ([facility]     2001 RT breat lumpectomy rad tx  . GERD (gastroesophageal reflux disease)   . Memory loss   . Pure hypercholesterolemia   . Memory change 10/24/2013  . Infiltrating ductal carcinoma of right breast ([facility] 01/09/2015  . Pulmonary nodule 01/09/2015  . Headache     years ago, none since hysterectomy  . Anemia     in the past  . Diarrhea   . Complication of anesthesia     confusion  . Lung cancer ([facility]     has Memory change; Periumbilical pain; Diarrhea; GERD (gastroesophageal reflux disease); Infiltrating ductal carcinoma of right breast ([facility]; Pulmonary nodule; Diverticulosis of colon without hemorrhage; Chronic diarrhea; Mediastinal adenopathy; Mucosal abnormality of stomach; and Primary cancer of right middle lobe of lung ([facility] on her problem list.     is allergic to codeine; morphine and related; adhesive; sulfur; and tamoxifen.  Ms. TSweetingdoes not currently have  medications on file.  Past Surgical History  Procedure Laterality Date  . Abdominal hysterectomy    . Bilateral oophorectomy  2007  . Incision and drainage of wound  2011    buttocks  . Tonsillectomy    . Cataract extraction Bilateral     lens implant  . Carpal tunnel release Right   . Colonoscopy  2011    RMR: 1. Anal papilla, otherwise normal rectum. 2. Left-sided diverticula, single  diminutive polyp in the sigmoid segment status post cold biospy removal. Remainder of the colonic mucosa appeared normal.   . Colonoscopy N/A 01/14/2015    Procedure: COLONOSCOPY;  Surgeon: Daneil Dolin, MD;  Location[facility];  Service: Endoscopy;  Laterality: N/A;  930 - moved to 10:15  . Esophagogastroduodenoscopy N/A 01/14/2015    RMR: Colonic diverticulosis. status post segmental biospy and stool sampling.  No egd done today  . Breast surgery Right     right lumpectomy, lymph glands remove  . Video bronchoscopy with endobronchial ultrasound N/A 01/19/2015    Procedure: VIDEO BRONCHOSCOPY WITH ENDOBRONCHIAL ULTRASOUND;  Surgeon: Ivin Poot, MD;  Location: [facility] OR;  Service: Thoracic;  Laterality: N/A;  . Esophagogastroduodenoscopy N/A 02/11/2015    RMR: Focally abnormal antrum of doubtful clinical significance- Status post biopsy.   . Video bronchoscopy with endobronchial ultrasound N/A 07/27/2015    Procedure: VIDEO BRONCHOSCOPY WITH ENDOBRONCHIAL ULTRASOUND;  Surgeon: Ivin Poot, MD;  Location: [facility] OR;  Service: Thoracic;  Laterality: N/A;    Denies any headaches, dizziness, double vision, fevers, chills, night sweats, nausea, vomiting, diarrhea, constipation, chest pain, heart palpitations, shortness of breath, blood in stool, black tarry stool, urinary pain, urinary burning, urinary frequency, hematuria, appetite loss.  14 point review of systems was performed and is negative except as detailed under history of present illness and above    PHYSICAL EXAMINATION  ECOG PERFORMANCE STATUS: 1 - Symptomatic but completely ambulatory  Filed Vitals:   10/18/15 1100  BP: 147/70  Pulse: 65  Temp: 97.7 F (36.5 C)  Resp: 18    GENERAL:alert, no distress, well nourished, well developed, comfortable, cooperative, obese, smiling and accompanied by her daughter. SKIN: skin color, texture, turgor are normal, no rashes or significant lesions HEAD: Normocephalic, No masses, lesions,  tenderness or abnormalities EYES: normal, PERRLA, EOMI, Conjunctiva are pink and non-injected EARS: External ears normal OROPHARYNX:lips, buccal mucosa, and tongue normal and mucous membranes are moist  NECK: supple, no adenopathy, thyroid normal size, non-tender, without nodularity, trachea midline LYMPH:  no palpable lymphadenopathy BREAST:not examined LUNGS: clear to auscultation  HEART: regular rate & rhythm, no murmurs, no gallops, S1 normal and S2 normal ABDOMEN:abdomen soft, non-tender, obese and normal bowel sounds BACK: Back symmetric, no curvature., No CVA tenderness EXTREMITIES:less then 2 second capillary refill, no joint deformities, effusion, or inflammation, no skin discoloration, no clubbing, no cyanosis  NEURO: alert & oriented x 3 with fluent speech, no focal motor/sensory deficits, gait normal   LABORATORY DATA: I have reviewed the data as listed. CBC    Component Value Date/Time   WBC 5.3 07/15/2015 0945   RBC 4.86 07/15/2015 0945   HGB 14.3 07/27/2015 1220   HCT 42.0 07/27/2015 1220   PLT 163 07/15/2015 0945   MCV 87.9 07/15/2015 0945   MCH 28.6 07/15/2015 0945   MCHC 32.6 07/15/2015 0945   RDW 13.8 07/15/2015 0945   LYMPHSABS 1.9 01/11/2015 1145   MONOABS 0.7 01/11/2015 1145   EOSABS 0.2 01/11/2015 1145   BASOSABS  0.1 01/11/2015 1145      Chemistry      Component Value Date/Time   NA 140 07/27/2015 1220   K 3.7 07/27/2015 1220   CL 102 01/19/2015 0817   CO2 24 01/19/2015 0817   BUN 13 01/19/2015 0817   CREATININE 0.80 03/19/2015 1109   CREATININE 0.69 12/16/2014 1058      Component Value Date/Time   CALCIUM 10.5* 01/19/2015 0817   ALKPHOS 60 01/19/2015 0817   AST 22 01/19/2015 0817   ALT 18 01/19/2015 0817   BILITOT 1.1 01/19/2015 0817      ASSESSMENT AND PLAN:  History of Stage II ER+ Her-2 neu indeterminate breast cancer diagnosed in 2001 Abnormal CT of the Chest Abnormal PET imaging of the chest EBUS with inconclusive results Lung,  core biopsy RML on 07/15/2015 biopsy positive for poorly differentiated carcinoma, staining pattern favors poorly differentiated squamous cell carcinoma Wang needle FNA 7 Node #2, no malignant cells 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 Pablo Ledger on Thursday, 2/2. She will most likely be scheduled for imaging studies at this appointment. I reviewed guidelines for ongoing surveillance of her lung cancer.   I will see her for routine follow up in 3 months. At this next visit, we will schedule Mrs. Enterline for a screening mammogram if she has not yet scheduled one. She will be due in May.  All questions were answered. The patient knows to call the clinic with any problems, questions or concerns. We can certainly see the patient much sooner if necessary.  This document serves as a record of services personally performed by Ancil Linsey, MD. It was created on her behalf by Arlyce Harman, a trained medical scribe. The creation of this record is based on the scribe's personal observations and the provider's statements to them. This document has been checked and approved by the attending provider.  I have reviewed the above documentation for accuracy and completeness, and I agree with the above. Molli Hazard, MD

## 2015-10-21 ENCOUNTER — Ambulatory Visit
Admission: RE | Admit: 2015-10-21 | Discharge: 2015-10-21 | Disposition: A | Discharge status: Home / Self Care | Payer: PPO | Source: Ambulatory Visit | Attending: Radiation Oncology | Admitting: Radiation Oncology

## 2015-10-21 ENCOUNTER — Encounter: Payer: Self-pay | Admitting: Radiation Oncology

## 2015-10-21 VITALS — BP 103/52 | HR 66 | Temp 97.5°F | Ht 63.0 in | Wt 166.3 lb

## 2015-10-21 DIAGNOSIS — C342 Malignant neoplasm of middle lobe, bronchus or lung: Secondary | ICD-10-CM

## 2015-10-21 NOTE — Progress Notes (Signed)
   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   2001: Right breast cancer at [facility] with Dr. Valere Dross  Interval History: Kari Bullock presents today for routine followup. She saw Dr. Whitney Muse on 10/18/15. She denies a cough or pain. She presents to the clinic with her husband.  Physical Exam:  Filed Vitals:   10/21/15 1312  BP: 103/52  Pulse: 66  Temp: 97.5 F (36.4 C)  Height: '5\' 3"'$  (1.6 m)  Weight: 166 lb 4.8 oz (75.433 kg)  SpO2: 96%  Alert and oriented 3. In no respiratory distress.  IMPRESSION: Kari Bullock is a 75 y.o. female with stage I squamous cell carcinoma of the right middle lobe with no acute effects of radiation.   PLAN: She will have a CT of the chest without contrast soon as a baseline and another in 3 months before she sees Dr Whitney Muse. She will follow up with Dr. Whitney Muse in May and myself on an as needed basis. She would prefer to be followed at [facility] as that is closer to home for her.  We discussed the evolution of radiation change in her lung. She can call me if she has any questions or concerns.  Kari Silversmith, MD  This document serves as a record of services personally performed by Kari Silversmith, MD. It was created on her behalf by Darcus Austin, a trained medical scribe. The creation of this record is based on the scribe's personal observations and the provider's statements to them. This document has been checked and approved by the attending provider.

## 2015-10-25 ENCOUNTER — Telehealth: Payer: Self-pay | Admitting: *Deleted

## 2015-10-25 ENCOUNTER — Other Ambulatory Visit: Payer: Self-pay | Admitting: Gastroenterology

## 2015-10-25 NOTE — Telephone Encounter (Signed)
CALLED PATIENT TO INFORM OF CT, LVM FOR  A RETURN CALL

## 2015-10-27 ENCOUNTER — Ambulatory Visit (HOSPITAL_COMMUNITY)
Admission: RE | Admit: 2015-10-27 | Discharge: 2015-10-27 | Disposition: A | Discharge status: Home / Self Care | Payer: PPO | Source: Ambulatory Visit | Attending: Radiation Oncology | Admitting: Radiation Oncology

## 2015-10-27 DIAGNOSIS — R590 Localized enlarged lymph nodes: Secondary | ICD-10-CM | POA: Insufficient documentation

## 2015-10-27 DIAGNOSIS — Z08 Encounter for follow-up examination after completed treatment for malignant neoplasm: Secondary | ICD-10-CM | POA: Diagnosis not present

## 2015-10-27 DIAGNOSIS — Z923 Personal history of irradiation: Secondary | ICD-10-CM | POA: Insufficient documentation

## 2015-10-27 DIAGNOSIS — C342 Malignant neoplasm of middle lobe, bronchus or lung: Secondary | ICD-10-CM | POA: Insufficient documentation

## 2015-10-27 DIAGNOSIS — Z853 Personal history of malignant neoplasm of breast: Secondary | ICD-10-CM | POA: Diagnosis not present

## 2015-10-27 DIAGNOSIS — R918 Other nonspecific abnormal finding of lung field: Secondary | ICD-10-CM | POA: Diagnosis not present

## 2015-10-28 ENCOUNTER — Other Ambulatory Visit: Payer: Self-pay | Admitting: Radiation Oncology

## 2015-10-28 DIAGNOSIS — C342 Malignant neoplasm of middle lobe, bronchus or lung: Secondary | ICD-10-CM

## 2015-10-29 ENCOUNTER — Telehealth: Payer: Self-pay | Admitting: *Deleted

## 2015-10-29 NOTE — Telephone Encounter (Signed)
CALLED PATIENT TO INFORM OF PET ON 11-08-15 AND HER FU ON 11-11-15, SPOKE WITH PATIENT AND SHE IS AWARE OF THESE APPTS.

## 2015-11-03 DIAGNOSIS — I119 Hypertensive heart disease without heart failure: Secondary | ICD-10-CM | POA: Diagnosis not present

## 2015-11-03 DIAGNOSIS — E784 Other hyperlipidemia: Secondary | ICD-10-CM | POA: Diagnosis not present

## 2015-11-03 DIAGNOSIS — E559 Vitamin D deficiency, unspecified: Secondary | ICD-10-CM | POA: Diagnosis not present

## 2015-11-03 DIAGNOSIS — I10 Essential (primary) hypertension: Secondary | ICD-10-CM | POA: Diagnosis not present

## 2015-11-08 ENCOUNTER — Ambulatory Visit (HOSPITAL_COMMUNITY)
Admission: RE | Admit: 2015-11-08 | Discharge: 2015-11-08 | Disposition: A | Discharge status: Home / Self Care | Payer: PPO | Source: Ambulatory Visit | Attending: Radiation Oncology | Admitting: Radiation Oncology

## 2015-11-08 DIAGNOSIS — C342 Malignant neoplasm of middle lobe, bronchus or lung: Secondary | ICD-10-CM | POA: Diagnosis not present

## 2015-11-08 DIAGNOSIS — R590 Localized enlarged lymph nodes: Secondary | ICD-10-CM | POA: Insufficient documentation

## 2015-11-08 DIAGNOSIS — R918 Other nonspecific abnormal finding of lung field: Secondary | ICD-10-CM | POA: Insufficient documentation

## 2015-11-08 DIAGNOSIS — C3491 Malignant neoplasm of unspecified part of right bronchus or lung: Secondary | ICD-10-CM | POA: Diagnosis not present

## 2015-11-08 LAB — GLUCOSE, CAPILLARY: Glucose-Capillary: 103 mg/dL — ABNORMAL HIGH (ref 65–99)

## 2015-11-08 MED ORDER — FLUDEOXYGLUCOSE F - 18 (FDG) INJECTION
8.1200 | Freq: Once | INTRAVENOUS | Status: AC | PRN
  Administered 2015-11-08: 8.12 via INTRAVENOUS

## 2015-11-10 NOTE — Progress Notes (Signed)
   Weight changes, if any:  Wt Readings from Last 3 Encounters:  11/11/15 166 lb 11.2 oz (75.615 kg)  10/21/15 166 lb 4.8 oz (75.433 kg)  10/18/15 164 lb 9.6 oz (74.662 kg)    Respiratory complaints, if any: None O2 sat 95%  Hemoptysis, if any:No Denies coughing  Pain issues, if any:  Mid Back 4/10 Taking Aleve for pain control  Swallowing Problems/Pain/Difficulty swallowing:No Smoking Tobacco/Marijuana/Snuff/ETOH VNR:[facility] 2001 Smoked 30 years Skin:Skin to chest normal BP 115/60 mmHg  Pulse 60  Temp(Src) 97.7 F (36.5 C)  Ht '5\' 3"'$  (1.6 m)  Wt 166 lb 11.2 oz (75.615 kg)  BMI 29.54 kg/m2  SpO2 95%

## 2015-11-11 ENCOUNTER — Encounter: Payer: Self-pay | Admitting: Radiation Oncology

## 2015-11-11 ENCOUNTER — Other Ambulatory Visit (HOSPITAL_COMMUNITY): Payer: Self-pay | Admitting: Hematology & Oncology

## 2015-11-11 ENCOUNTER — Ambulatory Visit
Admission: RE | Admit: 2015-11-11 | Discharge: 2015-11-11 | Disposition: A | Discharge status: Home / Self Care | Payer: PPO | Source: Ambulatory Visit | Attending: Radiation Oncology | Admitting: Radiation Oncology

## 2015-11-11 ENCOUNTER — Other Ambulatory Visit (HOSPITAL_COMMUNITY): Payer: Self-pay | Admitting: Oncology

## 2015-11-11 VITALS — BP 115/60 | HR 60 | Temp 97.7°F | Ht 63.0 in | Wt 166.7 lb

## 2015-11-11 DIAGNOSIS — Z510 Encounter for antineoplastic radiation therapy: Secondary | ICD-10-CM | POA: Insufficient documentation

## 2015-11-11 DIAGNOSIS — C50911 Malignant neoplasm of unspecified site of right female breast: Secondary | ICD-10-CM | POA: Diagnosis not present

## 2015-11-11 DIAGNOSIS — C342 Malignant neoplasm of middle lobe, bronchus or lung: Secondary | ICD-10-CM

## 2015-11-11 NOTE — Progress Notes (Signed)
   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   2001: Right breast cancer at [facility] with Dr. Valere Dross  Interval History: Kari Bullock presents today for routine followup. On 11/08/15 PET, subcarinal and right infrahilar lymph nodes have increased in size and metabolic activity. No hypermetabolic activity in the treated nodule. She has no evidence of metastatic disease. Her last brain MRI was in July. After discussion with Dr.Penland, we decided to offer her concurrent chemoradiation for this mediastinal recurrence. On today's visit, the patient complains of burning in her throat, likely of heartburn origin.   Physical Exam:  Filed Vitals:   11/11/15 1340  BP: 115/60  Pulse: 60  Temp: 97.7 F (36.5 C)  Height: '5\' 3"'$  (1.6 m)  Weight: 166 lb 11.2 oz (75.615 kg)  SpO2: 95%  Alert and oriented x3. Normal respiratory effort.   IMPRESSION: Kari Bullock is a 75 y.o. 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   PLAN:We discussed her diagnosis and stage. We discussed definitive chemoradiation in the management of her disease. We discussed 6 weeks of treatment as an outpatient. We discussed the process of simulation and the placement of tattoos. We discussed dysphagia, weight loss and fatigue as the acute side effects of radiation. We discussed damage to critical normal structures in the chest as well as the spinal cord as possible but improbably. We will schedule him for sim next week  and begin treatments as soon as possible. I encouraged her to start eating and gaining weight.     She has signed a consent form. She will proceed with simulation today.  We will order another brain MRI. The patient will follow up with Dr.Penland to begin chemotherapy as soon as possible.. She is leaving [facility].   ------------------------------------------------  Thea Silversmith, MD   This document serves as a record of services personally performed by Thea Silversmith, MD. It was created on her behalf by Derek Mound, a trained medical scribe. The creation of this record is based on the scribe's personal observations and the provider's statements to them. This document has been checked and approved by the attending provider.

## 2015-11-11 NOTE — Progress Notes (Signed)
[facility] Radiation Oncology Simulation and Treatment Planning Note   Name: Kari Bullock MRN: [id number]  Date: 11/11/2015  DOB: 1941-08-10  Status: outpatient    DIAGNOSIS: Infiltrating ductal carcinoma of right breast ([place])   Staging form: Breast, AJCC 7th Edition     Clinical: Stage IIA (T1a, N1, M0) - Signed by Baird Cancer, PA-C on 01/09/2015     CONSENT VERIFIED: yes   SET UP AND IMMOBILIZATION: Patient is setup supine with arms in a wing board.   NARRATIVE: The patient was brought to the [facility].  Identity was confirmed.  All relevant records and images related to the planned course of therapy were reviewed.  Then, the patient was positioned in a stable reproducible clinical set-up for radiation therapy.  CT images were obtained.  Skin markings were placed.  The CT images were loaded into the planning software where the target and avoidance structures were contoured.  The radiation prescription was entered and confirmed.   TREATMENT PLANNING NOTE:  Treatment planning then occurred. I have requested 3D simulation with [facility] of the spinal cord, total lungs and gross tumor volume. I have also requested mlcs and an isodose plan.   Special treatment procedure will be performed as Veneda Melter will be receiving concurrent chemotherapy.   I have ordered a consult with the dietician for monitoring.  I will also be verifying that weekly lab values are appropriate.   A total of 3 complex treatment devices will be utilized in the form of unique MLCs' to protect critical normal structures including the heart and normal lungs   ------------------------------------------------  Thea Silversmith, MD   This document serves as a record of services personally performed by Thea Silversmith, MD. It was created on her behalf by Derek Mound, a trained medical scribe. The creation of this record is based on the scribe's personal observations and  the provider's statements to them. This document has been checked and approved by the attending provider.

## 2015-11-12 ENCOUNTER — Other Ambulatory Visit: Payer: Self-pay | Admitting: Radiation Oncology

## 2015-11-12 ENCOUNTER — Other Ambulatory Visit: Payer: Self-pay | Admitting: Radiology

## 2015-11-12 DIAGNOSIS — C342 Malignant neoplasm of middle lobe, bronchus or lung: Secondary | ICD-10-CM

## 2015-11-12 MED ORDER — CEFAZOLIN SODIUM 1 G IJ SOLR: 2.0000 g | INTRAMUSCULAR | Status: AC

## 2015-11-15 ENCOUNTER — Other Ambulatory Visit (HOSPITAL_COMMUNITY): Payer: Self-pay | Admitting: Oncology

## 2015-11-15 ENCOUNTER — Encounter (HOSPITAL_COMMUNITY): Payer: Self-pay

## 2015-11-15 ENCOUNTER — Ambulatory Visit (HOSPITAL_COMMUNITY)
Admission: RE | Admit: 2015-11-15 | Discharge: 2015-11-15 | Disposition: A | Discharge status: Home / Self Care | Payer: PPO | Source: Ambulatory Visit | Attending: Hematology & Oncology | Admitting: Hematology & Oncology

## 2015-11-15 ENCOUNTER — Ambulatory Visit (HOSPITAL_COMMUNITY)
Admission: RE | Admit: 2015-11-15 | Discharge: 2015-11-15 | Disposition: A | Discharge status: Home / Self Care | Payer: PPO | Source: Ambulatory Visit | Attending: Oncology | Admitting: Oncology

## 2015-11-15 DIAGNOSIS — C3491 Malignant neoplasm of unspecified part of right bronchus or lung: Secondary | ICD-10-CM | POA: Diagnosis not present

## 2015-11-15 DIAGNOSIS — I10 Essential (primary) hypertension: Secondary | ICD-10-CM | POA: Diagnosis not present

## 2015-11-15 DIAGNOSIS — C342 Malignant neoplasm of middle lobe, bronchus or lung: Secondary | ICD-10-CM | POA: Insufficient documentation

## 2015-11-15 DIAGNOSIS — C3490 Malignant neoplasm of unspecified part of unspecified bronchus or lung: Secondary | ICD-10-CM | POA: Diagnosis not present

## 2015-11-15 DIAGNOSIS — Z853 Personal history of malignant neoplasm of breast: Secondary | ICD-10-CM | POA: Insufficient documentation

## 2015-11-15 DIAGNOSIS — Z87891 Personal history of nicotine dependence: Secondary | ICD-10-CM | POA: Insufficient documentation

## 2015-11-15 DIAGNOSIS — K219 Gastro-esophageal reflux disease without esophagitis: Secondary | ICD-10-CM | POA: Insufficient documentation

## 2015-11-15 DIAGNOSIS — Z452 Encounter for adjustment and management of vascular access device: Secondary | ICD-10-CM | POA: Diagnosis not present

## 2015-11-15 DIAGNOSIS — E7800 Pure hypercholesterolemia, unspecified: Secondary | ICD-10-CM | POA: Diagnosis not present

## 2015-11-15 DIAGNOSIS — Z803 Family history of malignant neoplasm of breast: Secondary | ICD-10-CM | POA: Diagnosis not present

## 2015-11-15 DIAGNOSIS — Z7982 Long term (current) use of aspirin: Secondary | ICD-10-CM | POA: Diagnosis not present

## 2015-11-15 DIAGNOSIS — Z8249 Family history of ischemic heart disease and other diseases of the circulatory system: Secondary | ICD-10-CM | POA: Diagnosis not present

## 2015-11-15 LAB — CBC WITH DIFFERENTIAL/PLATELET
BASOS PCT: 1 %
Basophils Absolute: 0.1 10*3/uL (ref 0.0–0.1)
EOS ABS: 0.2 10*3/uL (ref 0.0–0.7)
EOS PCT: 4 %
HCT: 40.3 % (ref 36.0–46.0)
HEMOGLOBIN: 12.8 g/dL (ref 12.0–15.0)
LYMPHS ABS: 1.3 10*3/uL (ref 0.7–4.0)
LYMPHS PCT: 22 %
MCH: 28.2 pg (ref 26.0–34.0)
MCHC: 31.8 g/dL (ref 30.0–36.0)
MCV: 88.8 fL (ref 78.0–100.0)
MONOS PCT: 8 %
Monocytes Absolute: 0.5 10*3/uL (ref 0.1–1.0)
Neutro Abs: 3.9 10*3/uL (ref 1.7–7.7)
Neutrophils Relative %: 65 %
PLATELETS: 171 10*3/uL (ref 150–400)
RBC: 4.54 MIL/uL (ref 3.87–5.11)
RDW: 13.6 % (ref 11.5–15.5)
WBC: 6 10*3/uL (ref 4.0–10.5)

## 2015-11-15 LAB — BASIC METABOLIC PANEL
ANION GAP: 9 (ref 5–15)
BUN: 19 mg/dL (ref 6–20)
CO2: 25 mmol/L (ref 22–32)
Calcium: 9.8 mg/dL (ref 8.9–10.3)
Chloride: 104 mmol/L (ref 101–111)
Creatinine, Ser: 0.64 mg/dL (ref 0.44–1.00)
GFR calc Af Amer: >60 mL/min (ref >60)
GFR calc non Af Amer: >60 mL/min (ref >60)
GLUCOSE: 96 mg/dL (ref 65–99)
Potassium: 3.5 mmol/L (ref 3.5–5.1)
SODIUM: 138 mmol/L (ref 135–145)

## 2015-11-15 LAB — PROTIME-INR
INR: 1.06 (ref 0.00–1.49)
PROTHROMBIN TIME: 13.6 s (ref 11.6–15.2)

## 2015-11-15 MED ORDER — FENTANYL CITRATE (PF) 100 MCG/2ML IJ SOLN
INTRAMUSCULAR | Status: AC | PRN
  Administered 2015-11-15: 25 ug via INTRAVENOUS
  Administered 2015-11-15: 50 ug via INTRAVENOUS
  Administered 2015-11-15: 25 ug via INTRAVENOUS

## 2015-11-15 MED ORDER — PROCHLORPERAZINE MALEATE 10 MG PO TABS: 10.0000 mg | ORAL_TABLET | Freq: Four times a day (QID) | ORAL | Status: DC | PRN

## 2015-11-15 MED ORDER — MIDAZOLAM HCL 2 MG/2ML IJ SOLN
INTRAMUSCULAR | Status: AC | PRN
  Administered 2015-11-15 (×2): 0.5 mg via INTRAVENOUS
  Administered 2015-11-15: 1 mg via INTRAVENOUS
  Administered 2015-11-15 (×3): 0.5 mg via INTRAVENOUS

## 2015-11-15 MED ORDER — LIDOCAINE HCL 1 % IJ SOLN
INTRAMUSCULAR | Status: AC
  Filled 2015-11-15: qty 20

## 2015-11-15 MED ORDER — FENTANYL CITRATE (PF) 100 MCG/2ML IJ SOLN
INTRAMUSCULAR | Status: AC
  Filled 2015-11-15: qty 2

## 2015-11-15 MED ORDER — LIDOCAINE-PRILOCAINE 2.5-2.5 % EX CREA: TOPICAL_CREAM | CUTANEOUS | Status: AC

## 2015-11-15 MED ORDER — HEPARIN SOD (PORK) LOCK FLUSH 100 UNIT/ML IV SOLN
INTRAVENOUS | Status: AC | PRN
  Administered 2015-11-15: 500 [IU]

## 2015-11-15 MED ORDER — MIDAZOLAM HCL 2 MG/2ML IJ SOLN
INTRAMUSCULAR | Status: AC
  Filled 2015-11-15: qty 2

## 2015-11-15 MED ORDER — MIDAZOLAM HCL 2 MG/2ML IJ SOLN
INTRAMUSCULAR | Status: AC
  Filled 2015-11-15: qty 4

## 2015-11-15 MED ORDER — CLINDAMYCIN PHOSPHATE 600 MG/50ML IV SOLN
600.0000 mg | Freq: Once | INTRAVENOUS | Status: AC
  Administered 2015-11-15: 600 mg via INTRAVENOUS
  Filled 2015-11-15: qty 50

## 2015-11-15 MED ORDER — HEPARIN SOD (PORK) LOCK FLUSH 100 UNIT/ML IV SOLN
INTRAVENOUS | Status: AC
  Filled 2015-11-15: qty 5

## 2015-11-15 MED ORDER — SODIUM CHLORIDE 0.9 % IV SOLN
INTRAVENOUS | Status: DC
  Administered 2015-11-15: 13:00:00 via INTRAVENOUS

## 2015-11-15 MED ORDER — ONDANSETRON HCL 8 MG PO TABS: 8.0000 mg | ORAL_TABLET | Freq: Three times a day (TID) | ORAL | Status: DC | PRN

## 2015-11-15 NOTE — Procedures (Signed)
LIJV PAC SVC RA No comp/EBL

## 2015-11-15 NOTE — H&P (Signed)
Chief Complaint: Patient was seen in consultation today for port placement at the request of Kefalas,Thomas S  Referring Physician(s): [facility]  Supervising physician: Marybelle Killings  History of Present Illness: Kari Bullock is a 75 y.o. 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 She is to have both chemotherapy and radiation therapy. She is here today for port placement. Has been NPO PMHx, meds, labs, allergies, imaging reviewed. Feels well, no recent illness, fevers.  Past Medical History  Diagnosis Date  . Hypertension   . Breast cancer ([place])     2001 RT breat lumpectomy rad tx  . GERD (gastroesophageal reflux disease)   . Memory loss   . Pure hypercholesterolemia   . Memory change 10/24/2013  . Infiltrating ductal carcinoma of right breast ([place]) 01/09/2015  . Pulmonary nodule 01/09/2015  . Headache     years ago, none since hysterectomy  . Anemia     in the past  . Diarrhea   . Complication of anesthesia     confusion  . Lung cancer [facility])     Past Surgical History  Procedure Laterality Date  . Abdominal hysterectomy    . Bilateral oophorectomy  2007  . Incision and drainage of wound  2011    buttocks  . Tonsillectomy    . Cataract extraction Bilateral     lens implant  . Carpal tunnel release Right   . Colonoscopy  2011    RMR: 1. Anal papilla, otherwise normal rectum. 2. Left-sided diverticula, single diminutive polyp in the sigmoid segment status post cold biospy removal. Remainder of the colonic mucosa appeared normal.   . Colonoscopy N/A 01/14/2015    Procedure: COLONOSCOPY;  Surgeon: Daneil Dolin, MD;  Location[facility];  Service: Endoscopy;  Laterality: N/A;  930 - moved to 10:15  . Esophagogastroduodenoscopy N/A 01/14/2015    RMR: Colonic diverticulosis. status post segmental biospy and stool sampling.  No egd done today  . Breast surgery Right     right lumpectomy, lymph  glands remove  . Video bronchoscopy with endobronchial ultrasound N/A 01/19/2015    Procedure: VIDEO BRONCHOSCOPY WITH ENDOBRONCHIAL ULTRASOUND;  Surgeon: Ivin Poot, MD;  Location: [facility] OR;  Service: Thoracic;  Laterality: N/A;  . Esophagogastroduodenoscopy N/A 02/11/2015    RMR: Focally abnormal antrum of doubtful clinical significance- Status post biopsy.   . Video bronchoscopy with endobronchial ultrasound N/A 07/27/2015    Procedure: VIDEO BRONCHOSCOPY WITH ENDOBRONCHIAL ULTRASOUND;  Surgeon: Ivin Poot, MD;  Location: [facility] OR;  Service: Thoracic;  Laterality: N/A;    Allergies: Codeine; Morphine and related; Adhesive; Sulfur; and Tamoxifen  Medications: Prior to Admission medications   Medication Sig Start Date End Date Taking? Authorizing Provider  aspirin EC 81 MG tablet Take 81 mg by mouth daily.   Yes Historical Provider, MD  cholecalciferol (VITAMIN D) 1000 UNITS tablet Take 1,000 Units by mouth at bedtime. D3   Yes Historical Provider, MD  donepezil (ARICEPT) 10 MG tablet TAKE 1 TABLET BY MOUTH EVERY NIGHT AT BEDTIME 12/27/14  Yes Kathrynn Ducking, MD  losartan-hydrochlorothiazide [facility]) 50-12.5 MG per tablet Take 1 tablet by mouth daily.  09/30/11  Yes Historical Provider, MD  naproxen sodium (ANAPROX) 220 MG tablet Take 220 mg by mouth 2 (two) times daily as needed (pain). ALEVE   Yes Historical Provider, MD  omeprazole (PRILOSEC) 20 MG capsule TAKE 1 CAPSULE(20 MG) BY MOUTH  TWICE DAILY BEFORE A MEAL 10/26/15  Yes Orvil Feil, NP     Family History  Problem Relation Age of Onset  . Stroke Mother   . Breast cancer Maternal Aunt   . Breast cancer Maternal Grandmother   . Heart attack Father   . Pancreatic cancer Brother     Social History   Social History  . Marital Status: Married    Spouse Name: N/A  . Number of Children: 3  . Years of Education: 12   Occupational History  . Retired    Social History Main Topics  . Smoking status: Former Smoker -- 1.00  packs/day for 20 years    Types: Cigarettes    Quit date: 11/20/1999  . Smokeless tobacco: Never Used  . Alcohol Use: No     Comment: occasional wine  . Drug Use: No  . Sexual Activity: Not Asked   Other Topics Concern  . None   Social History Narrative     Review of Systems: A 12 point ROS discussed and pertinent positives are indicated in the HPI above.  All other systems are negative.  Review of Systems  Vital Signs: BP 117/77 mmHg  Pulse 65  Temp(Src) 98.3 F (36.8 C) (Oral)  Resp 16  SpO2 98%  Physical Exam  Constitutional: She is oriented to person, place, and time. She appears well-developed and well-nourished. No distress.  HENT:  Head: Normocephalic.  Mouth/Throat: Oropharynx is clear and moist.  Neck: Normal range of motion. No tracheal deviation present.  Cardiovascular: Normal rate, regular rhythm and normal heart sounds.   Pulmonary/Chest: Effort normal and breath sounds normal. No respiratory distress.  Abdominal: Soft.  Neurological: She is alert and oriented to person, place, and time.  Psychiatric: She has a normal mood and affect. Judgment normal.    Mallampati Score:  MD Evaluation Airway: WNL Heart: WNL Abdomen: WNL Chest/ Lungs: WNL ASA  Classification: 2 Mallampati/Airway Score: One  Imaging: Ct Chest Wo Contrast  10/27/2015  CLINICAL DATA:  Right middle lobe lung carcinoma diagnosed in 2016. Radiation therapy 09/15/2015. Right breast cancer 16 years ago with lumpectomy and radiation therapy. EXAM: CT CHEST WITHOUT CONTRAST TECHNIQUE: Multidetector CT imaging of the chest was performed following the standard protocol without IV contrast. COMPARISON:  06/30/2015 FINDINGS: Mediastinum/Nodes: No supraclavicular adenopathy. Right axillary node dissection. No axillary adenopathy. Aortic and branch vessel atherosclerosis. Normal heart size, without pericardial effusion. LAD coronary artery atherosclerosis. Subcarinal node measures 2.6 x 2.8 cm on  image 28/series 2. Compare 1.7 x 2.3 cm on the prior. Suspect developing right infrahilar adenopathy at 1.5 cm on image 32/series 2. Compare 1.2 cm on the prior. Lungs/Pleura: No pleural fluid.  Mild centrilobular emphysema. Lingular nodule which measures 7 mm on image 36/series 3. Compare similar to the prior exam (when remeasured). A 3 mm left lower lobe pulmonary nodule on image 37/series 3 is unchanged. The right middle lobe dominant pulmonary nodule is no longer identified. Presumed radiation change in this area, including on image 42/series 3. Minimal posterior right upper lobe nodularity 2 mm on image 16/series 3, similar. Pleural-based anterior left lower lobe pulmonary nodule is similar at 12 mm on image 55/ series 3. Upper abdomen: Normal imaged portions of the liver, spleen, stomach, pancreas, adrenal glands, kidneys. Musculoskeletal: Thoracolumbar spondylosis. IMPRESSION: 1. response to therapy of the right middle lobe lung lesion. Only presumed radiation induced scarring remains. 2. Progression of mediastinal adenopathy with probable developing right infrahilar nodal metastasis. 3. Smaller pulmonary nodules  are unchanged and indeterminate. 4.  Atherosclerosis, including within the coronary arteries. Electronically Signed   By: Abigail Miyamoto M.D.   On: 10/27/2015 15:35   Nm Pet Image Initial (pi) Skull Base To Thigh  11/08/2015  CLINICAL DATA:  Subsequent treatment strategy for right lung cancer (right middle lobe). EXAM: NUCLEAR MEDICINE PET SKULL BASE TO THIGH TECHNIQUE: 8.1 mCi F-18 FDG was injected intravenously. Full-ring PET imaging was performed from the skull base to thigh after the radiotracer. CT data was obtained and used for attenuation correction and anatomic localization. FASTING BLOOD GLUCOSE:  Value: 103 mg/dl COMPARISON:  07/15/2015 FINDINGS: NECK No hypermetabolic lymph nodes in the neck. CHEST Subcarinal node 2 cm diameter, maximum standard uptake value the 30.5. This is partially  continuous with a right infrahilar node measuring 1.2 cm in diameter with maximum standard uptake value 27.5. In the space previously occupied by the right middle lobe nodule there is only some indistinct architectural distortion/scar-like appearance, without significant residual hypermetabolic activity at the site of the nodule. No significant abnormal hypermetabolic activity associated with the 7 mm lingular nodule currently observed. The density along the lower lingula is probably atelectasis adjacent to epicardial adipose tissue and not hypermetabolic. ABDOMEN/PELVIS Gastric antrum activity is likely physiologic. Similarly there is physiologic activity in the ascending colon. Sigmoid diverticulosis. No findings of active malignancy in the abdomen or pelvis. SKELETON No focal hypermetabolic activity to suggest skeletal metastasis. Degenerative glenohumeral arthropathy along with cervical spondylosis. Mid to lower thoracic spondylosis and lumbar spondylosis. IMPRESSION: 1. Prominent and hypermetabolic subcarinal and right infrahilar adenopathy compatible with malignancy, both the size and metabolic activity of these lymph nodes is significantly increased from April 2016. 2. There is some scarring in the vicinity of the original right middle lobe nodule, but without residual hypermetabolic activity in this vicinity. 3. 7 mm lingular nodule does not currently demonstrate hypermetabolic activity, but is below sensitive PET-CT size thresholds. No change in the size of this nodule from 12/31/2014. Electronically Signed   By: Van Clines M.D.   On: 11/08/2015 08:59    Labs:  CBC:  Recent Labs  01/19/15 0817 03/03/15 0752 07/15/15 0945 07/27/15 1220 11/15/15 1233  WBC 5.5 5.5 5.3  --  6.0  HGB 13.8 14.1 13.9 14.3 12.8  HCT 43.0 43.6 42.7 42.0 40.3  PLT 182 181 163  --  171    COAGS:  Recent Labs  01/19/15 0817 03/03/15 0840 07/15/15 0945 11/15/15 1233  INR 1.04 1.06 1.06 1.06  APTT 35  29 33  --     BMP:  Recent Labs  12/16/14 1058 01/11/15 1145 01/19/15 0817 03/19/15 1109 07/27/15 1220 11/15/15 1233  NA 140 138 139  --  140 138  K 4.0 3.7 4.0  --  3.7 3.5  CL 100 102 102  --   --  104  CO2 '28 28 24  '$ --   --  25  GLUCOSE 112* 83 108*  --  98 96  BUN '14 21 13  '$ --   --  19  CALCIUM 10.6* 10.5 10.5*  --   --  9.8  CREATININE 0.69 0.80 0.71 0.80  --  0.64  GFRNONAA  --  70* >60  --   --  >60  GFRAA  --  82* >60  --   --  >60    LIVER FUNCTION TESTS:  Recent Labs  12/16/14 1058 01/11/15 1145 01/19/15 0817  BILITOT 0.5 0.6 1.1  AST 19 25 22  ALT '15 21 18  '$ ALKPHOS 65 61 60  PROT 6.5 7.2 7.0  ALBUMIN 4.0 4.4 4.0    Assessment and Plan: RML lung cancer For port placement. Labs ok Risks and Benefits discussed with the patient including, but not limited to bleeding, infection, pneumothorax, or fibrin sheath development and need for additional procedures. All of the patient's questions were answered, patient is agreeable to proceed. Consent signed and in chart.    Thank you for this interesting consult.  I greatly enjoyed meeting SHARELL HILMER and look forward to participating in their care.  A copy of this report was sent to the requesting provider on this date.  Electronically Signed: Ascencion Dike 11/15/2015, 2:38 PM   I spent a total of 20 minutes in face to face in clinical consultation, greater than 50% of which was counseling/coordinating care for port placement

## 2015-11-15 NOTE — Sedation Documentation (Signed)
Patient denies pain and is resting comfortably.  

## 2015-11-15 NOTE — Discharge Instructions (Signed)
Moderate Conscious Sedation, Adult, Care After °Refer to this sheet in the next few weeks. These instructions provide you with information on caring for yourself after your procedure. Your health care provider may also give you more specific instructions. Your treatment has been planned according to current medical practices, but problems sometimes occur. Call your health care provider if you have any problems or questions after your procedure. °WHAT TO EXPECT AFTER THE PROCEDURE  °After your procedure: °· You may feel sleepy, clumsy, and have poor balance for several hours. °· Vomiting may occur if you eat too soon after the procedure. °HOME CARE INSTRUCTIONS °· Do not participate in any activities where you could become injured for at least 24 hours. Do not: °¨ Drive. °¨ Swim. °¨ Ride a bicycle. °¨ Operate heavy machinery. °¨ Cook. °¨ Use power tools. °¨ Climb ladders. °¨ Work from a high place. °· Do not make important decisions or sign legal documents until you are improved. °· If you vomit, drink water, juice, or soup when you can drink without vomiting. Make sure you have little or no nausea before eating solid foods. °· Only take over-the-counter or prescription medicines for pain, discomfort, or fever as directed by your health care provider. °· Make sure you and your family fully understand everything about the medicines given to you, including what side effects may occur. °· You should not drink alcohol, take sleeping pills, or take medicines that cause drowsiness for at least 24 hours. °· If you smoke, do not smoke without supervision. °· If you are feeling better, you may resume normal activities 24 hours after you were sedated. °· Keep all appointments with your health care provider. °SEEK MEDICAL CARE IF: °· Your skin is pale or bluish in color. °· You continue to feel nauseous or vomit. °· Your pain is getting worse and is not helped by medicine. °· You have bleeding or swelling. °· You are still  sleepy or feeling clumsy after 24 hours. °SEEK IMMEDIATE MEDICAL CARE IF: °· You develop a rash. °· You have difficulty breathing. °· You develop any type of allergic problem. °· You have a fever. °MAKE SURE YOU: °· Understand these instructions. °· Will watch your condition. °· Will get help right away if you are not doing well or get worse. °  °This information is not intended to replace advice given to you by your health care provider. Make sure you discuss any questions you have with your health care provider. °  °Document Released: 06/25/2013 Document Revised: 09/25/2014 Document Reviewed: 06/25/2013 °Elsevier Interactive Patient Education ©2016 [employer] °Implanted Port Insertion, Care After °Refer to this sheet in the next few weeks. These instructions provide you with information on caring for yourself after your procedure. Your health care provider may also give you more specific instructions. Your treatment has been planned according to current medical practices, but problems sometimes occur. Call your health care provider if you have any problems or questions after your procedure. °WHAT TO EXPECT AFTER THE PROCEDURE °After your procedure, it is typical to have the following:  °· Discomfort at the port insertion site. Ice packs to the area will help. °· Bruising on the skin over the port. This will subside in 3-4 days. °HOME CARE INSTRUCTIONS °· After your port is placed, you will get a manufacturer's information card. The card has information about your port. Keep this card with you at all times.   °· Know what kind of port you have. There are many types   of ports available.   °· Wear a medical alert bracelet in case of an emergency. This can help alert health care workers that you have a port.   °· The port can stay in for as long as your health care provider believes it is necessary.   °· A home health care nurse may give medicines and take care of the port.   °· You or a family member can get  special training and directions for giving medicine and taking care of the port at home.   °SEEK MEDICAL CARE IF:  °· Your port does not flush or you are unable to get a blood return.   °· You have a fever or chills. °SEEK IMMEDIATE MEDICAL CARE IF: °· You have new fluid or pus coming from your incision.   °· You notice a bad smell coming from your incision site.   °· You have swelling, pain, or more redness at the incision or port site.   °· You have chest pain or shortness of breath. °  °This information is not intended to replace advice given to you by your health care provider. Make sure you discuss any questions you have with your health care provider. °  °Document Released: 06/25/2013 Document Revised: 09/09/2013 Document Reviewed: 06/25/2013 °Elsevier Interactive Patient Education ©2016 [employer] °Implanted Port Home Guide °An implanted port is a type of central line that is placed under the skin. Central lines are used to provide IV access when treatment or nutrition needs to be given through a person's veins. Implanted ports are used for long-term IV access. An implanted port may be placed because:  °· You need IV medicine that would be irritating to the small veins in your hands or arms.   °· You need long-term IV medicines, such as antibiotics.   °· You need IV nutrition for a long period.   °· You need frequent blood draws for lab tests.   °· You need dialysis.   °Implanted ports are usually placed in the chest area, but they can also be placed in the upper arm, the abdomen, or the leg. An implanted port has two main parts:  °· Reservoir. The reservoir is round and will appear as a small, raised area under your skin. The reservoir is the part where a needle is inserted to give medicines or draw blood.   °· Catheter. The catheter is a thin, flexible tube that extends from the reservoir. The catheter is placed into a large vein. Medicine that is inserted into the reservoir goes into the catheter and  then into the vein.   °HOW WILL I CARE FOR MY INCISION SITE? °Do not get the incision site wet. Bathe or shower as directed by your health care provider.  °HOW IS MY PORT ACCESSED? °Special steps must be taken to access the port:  °· Before the port is accessed, a numbing cream can be placed on the skin. This helps numb the skin over the port site.   °· Your health care provider uses a sterile technique to access the port. °· Your health care provider must put on a mask and sterile gloves. °· The skin over your port is cleaned carefully with an antiseptic and allowed to dry. °· The port is gently pinched between sterile gloves, and a needle is inserted into the port. °· Only "non-coring" port needles should be used to access the port. Once the port is accessed, a blood return should be checked. This helps ensure that the port is in the vein and is not clogged.   °· If your port   needs to remain accessed for a constant infusion, a clear (transparent) bandage will be placed over the needle site. The bandage and needle will need to be changed every week, or as directed by your health care provider.   °· Keep the bandage covering the needle clean and dry. Do not get it wet. Follow your health care provider's instructions on how to take a shower or bath while the port is accessed.   °· If your port does not need to stay accessed, no bandage is needed over the port.   °WHAT IS FLUSHING? °Flushing helps keep the port from getting clogged. Follow your health care provider's instructions on how and when to flush the port. Ports are usually flushed with saline solution or a medicine called heparin. The need for flushing will depend on how the port is used.  °· If the port is used for intermittent medicines or blood draws, the port will need to be flushed:   °· After medicines have been given.   °· After blood has been drawn.   °· As part of routine maintenance.   °· If a constant infusion is running, the port may not need to  be flushed.   °HOW LONG WILL MY PORT STAY IMPLANTED? °The port can stay in for as long as your health care provider thinks it is needed. When it is time for the port to come out, surgery will be done to remove it. The procedure is similar to the one performed when the port was put in.  °WHEN SHOULD I SEEK IMMEDIATE MEDICAL CARE? °When you have an implanted port, you should seek immediate medical care if:  °· You notice a bad smell coming from the incision site.   °· You have swelling, redness, or drainage at the incision site.   °· You have more swelling or pain at the port site or the surrounding area.   °· You have a fever that is not controlled with medicine. °  °This information is not intended to replace advice given to you by your health care provider. Make sure you discuss any questions you have with your health care provider. °  °Document Released: 09/04/2005 Document Revised: 06/25/2013 Document Reviewed: 05/12/2013 °Elsevier Interactive Patient Education ©2016 [employer] ° °

## 2015-11-15 NOTE — Patient Instructions (Addendum)
[place]   CHEMOTHERAPY INSTRUCTIONS  Premeds: Zofran - for nausea/vomiting prevention/reduction. Dexamethasone -  steroid - given to reduce the risk of you having an allergic type reaction to the Taxol chemotherapy. Dex can cause you to feel energized, nervous/anxious/jittery, make you have trouble sleeping, and/or make you feel hot/flushed in the face/neck and/or look pink/red in the face/neck. These side effects will pass as the Dex wears off. Benadryl- anti-histamine - given to reduce the risk of you having an allergic reaction to the Taxol chemo. Pepcid - anti-histamine - given to reduce the risk of you having an allergic reaction to the Taxol chemo. (takes 1 hour to infuse)   Taxol - the first time you receive this drug we will titrate it very slowly to ensure that you do not have or are not having an allergic reaction to the chemo. Side Effects: hair loss, lowers your white blood cells (fight infection), muscle aches, nausea/vomiting, irritation to the mouth (mouth sores, pain in your mouth) *neuropathy - numbness/tingling/burning in hands/fingers/feet/toes. We need to know as soon as this begins to happen so that we can monitor it and treat if necessary. The numbness generally begins in the fingertips of tips of toes and then begins to travel up the finger/toe/hand/foot. We never want you getting to where you can't pick up a pen, coin, zip a zipper, button a button, or have trouble walking. You must tell us immediately if you are experiencing peripheral neuropathy! (the first time you receive this, it will take approximately 3 hours, after that it will only take 1 hour)  Carboplatin - this medication can be hard on your kidneys - this is why we need you drinking 64 oz of fluid (preferably water/decaff fluids) 2 days prior to chemo and for up to 4-5 days after chemo. Drink more if you can. This will help to keep your kidneys flushed. This can cause mild hair  loss, lower your platelets (which keep you from bleeding out when you cut yourself), lower your white blood cells (fight infection), and cause nausea/vomiting. (takes 30 minutes to infuse)   POTENTIAL SIDE EFFECTS OF TREATMENT: Increased Susceptibility to Infection, Vomiting, Constipation, Hair Thinning, Changes in Character of Skin and Nails (brittleness, dryness,etc.), Bone Marrow Suppression, Abdominal Cramping, Complete Hair Loss, Nausea, Diarrhea, Sun Sensitivity and Mouth Sores   SELF IMAGE NEEDS AND REFERRALS MADE: Obtain hair accessories as soon as possible (wigs, scarves, turbans,caps,etc.)  Referral to Look Good, Feel Better consultant - program is done every other month.    EDUCATIONAL MATERIALS GIVEN AND REVIEWED: Chemotherapy and You booklet Specific Instructions Sheets: Carboplatin, Taxol, Zofran, Dexamethasone, Benadryl, Pepcid, EMLA cream, Zofran tablets, Compazine tablets.    SELF CARE ACTIVITIES WHILE ON CHEMOTHERAPY: Increase your fluid intake 48 hours prior to treatment and drink at least 2 quarts per day after treatment., No alcohol intake., No aspirin or other medications unless approved by your oncologist., Eat foods that are light and easy to digest., Eat foods at cold or room temperature., No fried, fatty, or spicy foods immediately before or after treatment., Have teeth cleaned professionally before starting treatment. Keep dentures and partial plates clean., Use soft toothbrush and do not use mouthwashes that contain alcohol. Biotene is a good mouthwash that is available at most pharmacies or may be ordered by calling [phone]., Use warm salt water gargles (1 teaspoon salt per 1 quart warm water) before and after meals and at bedtime. Or you may rinse with 2 tablespoons  of three -percent hydrogen peroxide mixed in eight ounces of water., Always use sunscreen with SPF (Sun Protection Factor) of 30 or higher., Use your nausea medication as directed to prevent nausea.,  Use your stool softener or laxative as directed to prevent constipation. and Use your anti-diarrheal medication as directed to stop diarrhea.  Please wash your hands for at least 30 seconds using warm soapy water. Handwashing is the #1 way to prevent the spread of germs. Stay away from sick people or people who are getting over a cold. If you develop respiratory systems such as green/yellow mucus production or productive cough or persistent cough let us know and we will see if you need an antibiotic. It is a good idea to keep a pair of gloves on when going into grocery stores/[employer] to decrease your risk of coming into contact with germs on the carts, etc. Carry alcohol hand gel with you at all times and use it frequently if out in public. All foods need to be cooked thoroughly. No raw foods. No medium or undercooked meats, eggs. If your food is cooked medium well, it does not need to be hot pink or saturated with bloody liquid at all. Vegetables and fruits need to be washed/rinsed under the faucet with a dish detergent before being consumed. You can eat raw fruits and vegetables unless we tell you otherwise but it would be best if you cooked them or bought frozen. Do not eat off of salad bars or hot bars unless you really trust the cleanliness of the restaurant. If you need dental work, please let Dr. Whitney Muse know before you go for your appointment so that we can coordinate the best possible time for you in regards to your chemo regimen. You need to also let your dentist know that you are actively taking chemo. We may need to do labs prior to your dental appointment. We also want your bowels moving at least every other day. If this is not happening, we need to know so that we can get you on a bowel regimen to help you go. If you are going to have sex, your partner must wear a condom. This is to protect your partner from potential chemotherapy exposure. This will need to be done for up to 28 days post chemo  completion.     MEDICATIONS: You have been given prescriptions for the following medications:  Zofran/Ondansetron '8mg'$  tablet. Take 1 tablet every 8 hours as needed for nausea/vomiting. (#1 nausea med to take, this can constipate)  Compazine/Prochlorperazine '10mg'$  tablet. Take 1 tablet every 6 hours as needed for nausea/vomiting. (#2 nausea med to take, this can make you sleepy)  EMLA cream. Apply a quarter size amount to port site 1 hour prior to chemo. Do not rub in. Cover with plastic wrap.   Over-the-Counter Meds:  Miralax 1 capful in 8 oz of fluid daily. May increase to two times a day if needed. This is a stool softener. If this doesn't work proceed you can add:  Senokot S  - start with 1 tablet two times a day and increase to 4 tablets two times a day if needed. (total of 8 tablets in a 24 hour period). This is a stimulant laxative.   Call us if this does not help your bowels move.   Imodium '2mg'$  capsule. Take 2 capsules after the 1st loose stool and then 1 capsule every 2 hours until you go a total of 12 hours without having a loose stool. Call  the [facility] if loose stools continue. If diarrhea occurs @ bedtime, take 2 capsules @ bedtime. Then take 2 capsules every 4 hours until morning. Call [facility].      SYMPTOMS TO REPORT AS SOON AS POSSIBLE AFTER TREATMENT:  FEVER GREATER THAN 100.5 F  CHILLS WITH OR WITHOUT FEVER  NAUSEA AND VOMITING THAT IS NOT CONTROLLED WITH YOUR NAUSEA MEDICATION  UNUSUAL SHORTNESS OF BREATH  UNUSUAL BRUISING OR BLEEDING  TENDERNESS IN MOUTH AND THROAT WITH OR WITHOUT PRESENCE OF ULCERS  URINARY PROBLEMS  BOWEL PROBLEMS  UNUSUAL RASH    Wear comfortable clothing and clothing appropriate for easy access to any Portacath or PICC line. Let us know if there is anything that we can do to make your therapy better!      I have been informed and understand all of the instructions given to me and have received a copy. I have  been instructed to call the clinic [phone] or my family physician as soon as possible for continued medical care, if indicated. I do not have any more questions at this time but understand that I may call the [facility] or the Patient Navigator at [phone] during office hours should I have questions or need assistance in obtaining follow-up care.           Carboplatin injection What is this medicine? CARBOPLATIN (KAR boe pla tin) is a chemotherapy drug. It targets fast dividing cells, like cancer cells, and causes these cells to die. This medicine is used to treat ovarian cancer and many other cancers. This medicine may be used for other purposes; ask your health care provider or pharmacist if you have questions. What should I tell my health care provider before I take this medicine? They need to know if you have any of these conditions: -blood disorders -hearing problems -kidney disease -recent or ongoing radiation therapy -an unusual or allergic reaction to carboplatin, cisplatin, other chemotherapy, other medicines, foods, dyes, or preservatives -pregnant or trying to get pregnant -breast-feeding How should I use this medicine? This drug is usually given as an infusion into a vein. It is administered in a hospital or clinic by a specially trained health care professional. Talk to your pediatrician regarding the use of this medicine in children. Special care may be needed. Overdosage: If you think you have taken too much of this medicine contact a poison control center or emergency room at once. NOTE: This medicine is only for you. Do not share this medicine with others. What if I miss a dose? It is important not to miss a dose. Call your doctor or health care professional if you are unable to keep an appointment. What may interact with this medicine? -medicines for seizures -medicines to increase blood counts like filgrastim, pegfilgrastim, sargramostim -some  antibiotics like amikacin, gentamicin, neomycin, streptomycin, tobramycin -vaccines Talk to your doctor or health care professional before taking any of these medicines: -acetaminophen -aspirin -ibuprofen -ketoprofen -naproxen This list may not describe all possible interactions. Give your health care provider a list of all the medicines, herbs, non-prescription drugs, or dietary supplements you use. Also tell them if you smoke, drink alcohol, or use illegal drugs. Some items may interact with your medicine. What should I watch for while using this medicine? Your condition will be monitored carefully while you are receiving this medicine. You will need important blood work done while you are taking this medicine. This drug may make you feel generally unwell. This is not  uncommon, as chemotherapy can affect healthy cells as well as cancer cells. Report any side effects. Continue your course of treatment even though you feel ill unless your doctor tells you to stop. In some cases, you may be given additional medicines to help with side effects. Follow all directions for their use. Call your doctor or health care professional for advice if you get a fever, chills or sore throat, or other symptoms of a cold or flu. Do not treat yourself. This drug decreases your body's ability to fight infections. Try to avoid being around people who are sick. This medicine may increase your risk to bruise or bleed. Call your doctor or health care professional if you notice any unusual bleeding. Be careful brushing and flossing your teeth or using a toothpick because you may get an infection or bleed more easily. If you have any dental work done, tell your dentist you are receiving this medicine. Avoid taking products that contain aspirin, acetaminophen, ibuprofen, naproxen, or ketoprofen unless instructed by your doctor. These medicines may hide a fever. Do not become pregnant while taking this medicine. Women should  inform their doctor if they wish to become pregnant or think they might be pregnant. There is a potential for serious side effects to an unborn child. Talk to your health care professional or pharmacist for more information. Do not breast-feed an infant while taking this medicine. What side effects may I notice from receiving this medicine? Side effects that you should report to your doctor or health care professional as soon as possible: -allergic reactions like skin rash, itching or hives, swelling of the face, lips, or tongue -signs of infection - fever or chills, cough, sore throat, pain or difficulty passing urine -signs of decreased platelets or bleeding - bruising, pinpoint red spots on the skin, black, tarry stools, nosebleeds -signs of decreased red blood cells - unusually weak or tired, fainting spells, lightheadedness -breathing problems -changes in hearing -changes in vision -chest pain -high blood pressure -low blood counts - This drug may decrease the number of white blood cells, red blood cells and platelets. You may be at increased risk for infections and bleeding. -nausea and vomiting -pain, swelling, redness or irritation at the injection site -pain, tingling, numbness in the hands or feet -problems with balance, talking, walking -trouble passing urine or change in the amount of urine Side effects that usually do not require medical attention (report to your doctor or health care professional if they continue or are bothersome): -hair loss -loss of appetite -metallic taste in the mouth or changes in taste This list may not describe all possible side effects. Call your doctor for medical advice about side effects. You may report side effects to FDA at 1-800-FDA-1088. Where should I keep my medicine? This drug is given in a hospital or clinic and will not be stored at home. NOTE: This sheet is a summary. It may not cover all possible information. If you have questions about  this medicine, talk to your doctor, pharmacist, or health care provider.    2016, Elsevier/Gold Standard. (2007-12-10 14:38:05) Paclitaxel injection What is this medicine? PACLITAXEL (PAK li TAX el) is a chemotherapy drug. It targets fast dividing cells, like cancer cells, and causes these cells to die. This medicine is used to treat ovarian cancer, breast cancer, and other cancers. This medicine may be used for other purposes; ask your health care provider or pharmacist if you have questions. What should I tell my health care provider before  I take this medicine? They need to know if you have any of these conditions: -blood disorders -irregular heartbeat -infection (especially a virus infection such as chickenpox, cold sores, or herpes) -liver disease -previous or ongoing radiation therapy -an unusual or allergic reaction to paclitaxel, alcohol, polyoxyethylated castor oil, other chemotherapy agents, other medicines, foods, dyes, or preservatives -pregnant or trying to get pregnant -breast-feeding How should I use this medicine? This drug is given as an infusion into a vein. It is administered in a hospital or clinic by a specially trained health care professional. Talk to your pediatrician regarding the use of this medicine in children. Special care may be needed. Overdosage: If you think you have taken too much of this medicine contact a poison control center or emergency room at once. NOTE: This medicine is only for you. Do not share this medicine with others. What if I miss a dose? It is important not to miss your dose. Call your doctor or health care professional if you are unable to keep an appointment. What may interact with this medicine? Do not take this medicine with any of the following medications: -disulfiram -metronidazole This medicine may also interact with the following medications: -cyclosporine -diazepam -ketoconazole -medicines to increase blood counts like  filgrastim, pegfilgrastim, sargramostim -other chemotherapy drugs like cisplatin, doxorubicin, epirubicin, etoposide, teniposide, vincristine -quinidine -testosterone -vaccines -verapamil Talk to your doctor or health care professional before taking any of these medicines: -acetaminophen -aspirin -ibuprofen -ketoprofen -naproxen This list may not describe all possible interactions. Give your health care provider a list of all the medicines, herbs, non-prescription drugs, or dietary supplements you use. Also tell them if you smoke, drink alcohol, or use illegal drugs. Some items may interact with your medicine. What should I watch for while using this medicine? Your condition will be monitored carefully while you are receiving this medicine. You will need important blood work done while you are taking this medicine. This drug may make you feel generally unwell. This is not uncommon, as chemotherapy can affect healthy cells as well as cancer cells. Report any side effects. Continue your course of treatment even though you feel ill unless your doctor tells you to stop. This medicine can cause serious allergic reactions. To reduce your risk you will need to take other medicine(s) before treatment with this medicine. In some cases, you may be given additional medicines to help with side effects. Follow all directions for their use. Call your doctor or health care professional for advice if you get a fever, chills or sore throat, or other symptoms of a cold or flu. Do not treat yourself. This drug decreases your body's ability to fight infections. Try to avoid being around people who are sick. This medicine may increase your risk to bruise or bleed. Call your doctor or health care professional if you notice any unusual bleeding. Be careful brushing and flossing your teeth or using a toothpick because you may get an infection or bleed more easily. If you have any dental work done, tell your dentist you  are receiving this medicine. Avoid taking products that contain aspirin, acetaminophen, ibuprofen, naproxen, or ketoprofen unless instructed by your doctor. These medicines may hide a fever. Do not become pregnant while taking this medicine. Women should inform their doctor if they wish to become pregnant or think they might be pregnant. There is a potential for serious side effects to an unborn child. Talk to your health care professional or pharmacist for more information. Do not breast-feed  an infant while taking this medicine. Men are advised not to father a child while receiving this medicine. This product may contain alcohol. Ask your pharmacist or healthcare provider if this medicine contains alcohol. Be sure to tell all healthcare providers you are taking this medicine. Certain medicines, like metronidazole and disulfiram, can cause an unpleasant reaction when taken with alcohol. The reaction includes flushing, headache, nausea, vomiting, sweating, and increased thirst. The reaction can last from 30 minutes to several hours. What side effects may I notice from receiving this medicine? Side effects that you should report to your doctor or health care professional as soon as possible: -allergic reactions like skin rash, itching or hives, swelling of the face, lips, or tongue -low blood counts - This drug may decrease the number of white blood cells, red blood cells and platelets. You may be at increased risk for infections and bleeding. -signs of infection - fever or chills, cough, sore throat, pain or difficulty passing urine -signs of decreased platelets or bleeding - bruising, pinpoint red spots on the skin, black, tarry stools, nosebleeds -signs of decreased red blood cells - unusually weak or tired, fainting spells, lightheadedness -breathing problems -chest pain -high or low blood pressure -mouth sores -nausea and vomiting -pain, swelling, redness or irritation at the injection  site -pain, tingling, numbness in the hands or feet -slow or irregular heartbeat -swelling of the ankle, feet, hands Side effects that usually do not require medical attention (report to your doctor or health care professional if they continue or are bothersome): -bone pain -complete hair loss including hair on your head, underarms, pubic hair, eyebrows, and eyelashes -changes in the color of fingernails -diarrhea -loosening of the fingernails -loss of appetite -muscle or joint pain -red flush to skin -sweating This list may not describe all possible side effects. Call your doctor for medical advice about side effects. You may report side effects to FDA at 1-800-FDA-1088. Where should I keep my medicine? This drug is given in a hospital or clinic and will not be stored at home. NOTE: This sheet is a summary. It may not cover all possible information. If you have questions about this medicine, talk to your doctor, pharmacist, or health care provider.    2016, Elsevier/Gold Standard. (2015-04-22 13:02:56) Famotidine injection What is this medicine? FAMOTIDINE (fa MOE ti deen) is a type of antihistamine that blocks the release of stomach acid. It is used to treat stomach or intestinal ulcers. It can relieve ulcer pain and discomfort, and the heartburn from acid reflux. This medicine may be used for other purposes; ask your health care provider or pharmacist if you have questions. What should I tell my health care provider before I take this medicine? They need to know if you have any of these conditions: -kidney or liver disease -an unusual or allergic reaction to famotidine, other medicines, foods, dyes, or preservatives -pregnant or trying to get pregnant -breast-feeding How should I use this medicine? This medicine is for infusion into a vein. It is given by a health care professional in a hospital or clinic setting. Talk to your pediatrician regarding the use of this medicine in  children. Special care may be needed. Overdosage: If you think you have taken too much of this medicine contact a poison control center or emergency room at once. NOTE: This medicine is only for you. Do not share this medicine with others. What if I miss a dose? This does not apply. What may interact with this medicine? -  delavirdine -itraconazole -ketoconazole This list may not describe all possible interactions. Give your health care provider a list of all the medicines, herbs, non-prescription drugs, or dietary supplements you use. Also tell them if you smoke, drink alcohol, or use illegal drugs. Some items may interact with your medicine. What should I watch for while using this medicine? Tell your doctor or health care professional if your condition does not start to get better or gets worse. Do not take with aspirin, ibuprofen, or other antiinflammatory medicines. These can aggravate your condition. Do not smoke cigarettes or drink alcohol. These increase irritation in your stomach and can increase the time it will take for ulcers to heal. Cigarettes and alcohol can also worsen acid reflux or heartburn. If you get black, tarry stools or vomit up what looks like coffee grounds, call your doctor or health care professional at once. You may have a bleeding ulcer. What side effects may I notice from receiving this medicine? Side effects that you should report to your doctor or health care professional as soon as possible: -allergic reactions like skin rash, itching or hives, swelling of the face, lips, or tongue -agitation, nervousness -confusion -hallucinations Side effects that usually do not require medical attention (report to your doctor or health care professional if they continue or are bothersome): -constipation -diarrhea -dizziness -headache This list may not describe all possible side effects. Call your doctor for medical advice about side effects. You may report side effects to  FDA at 1-800-FDA-1088. Where should I keep my medicine? This medicine is given in a hospital or clinic. You will not be given this medicine to store at home. NOTE: This sheet is a summary. It may not cover all possible information. If you have questions about this medicine, talk to your doctor, pharmacist, or health care provider.    2016, [employer]/Gold Standard. (2008-01-08 13:24:51) Diphenhydramine injection What is this medicine? DIPHENHYDRAMINE (dye fen HYE dra meen) is an antihistamine. It is used to treat the symptoms of an allergic reaction and motion sickness. It is also used to treat Parkinson's disease. This medicine may be used for other purposes; ask your health care provider or pharmacist if you have questions. What should I tell my health care provider before I take this medicine? They need to know if you have any of these conditions: -asthma or lung disease -glaucoma -high blood pressure or heart disease -liver disease -pain or difficulty passing urine -prostate trouble -ulcers or other stomach problems -an unusual or allergic reaction to diphenhydramine, antihistamines, other medicines foods, dyes, or preservatives -pregnant or trying to get pregnant -breast-feeding How should I use this medicine? This medicine is for injection into a vein or a muscle. It is usually given by a health care professional in a hospital or clinic setting. If you get this medicine at home, you will be taught how to prepare and give this medicine. Use exactly as directed. Take your medicine at regular intervals. Do not take your medicine more often than directed. It is important that you put your used needles and syringes in a special sharps container. Do not put them in a trash can. If you do not have a sharps container, call your pharmacist or healthcare provider to get one. Talk to your pediatrician regarding the use of this medicine in children. While this drug may be prescribed for selected  conditions, precautions do apply. This medicine is not approved for use in newborns and premature babies. Patients over 96 years old may have  a stronger reaction and need a smaller dose. Overdosage: If you think you have taken too much of this medicine contact a poison control center or emergency room at once. NOTE: This medicine is only for you. Do not share this medicine with others. What if I miss a dose? If you miss a dose, take it as soon as you can. If it is almost time for your next dose, take only that dose. Do not take double or extra doses. What may interact with this medicine? Do not take this medicine with any of the following medications: -MAOIs like Carbex, Eldepryl, Marplan, Nardil, and Parnate This medicine may also interact with the following medications: -alcohol -barbiturates, like phenobarbital -medicines for bladder spasm like oxybutynin, tolterodine -medicines for blood pressure -medicines for depression, anxiety, or psychotic disturbances -medicines for movement abnormalities or Parkinson's disease -medicines for sleep -other medicines for cold, cough or allergy -some medicines for the stomach like chlordiazepoxide, dicyclomine This list may not describe all possible interactions. Give your health care provider a list of all the medicines, herbs, non-prescription drugs, or dietary supplements you use. Also tell them if you smoke, drink alcohol, or use illegal drugs. Some items may interact with your medicine. What should I watch for while using this medicine? Your condition will be monitored carefully while you are receiving this medicine. Tell your doctor or healthcare professional if your symptoms do not start to get better or if they get worse. You may get drowsy or dizzy. Do not drive, use machinery, or do anything that needs mental alertness until you know how this medicine affects you. Do not stand or sit up quickly, especially if you are an older patient. This  reduces the risk of dizzy or fainting spells. Alcohol may interfere with the effect of this medicine. Avoid alcoholic drinks. Your mouth may get dry. Chewing sugarless gum or sucking hard candy, and drinking plenty of water may help. Contact your doctor if the problem does not go away or is severe. What side effects may I notice from receiving this medicine? Side effects that you should report to your doctor or health care professional as soon as possible: -allergic reactions like skin rash, itching or hives, swelling of the face, lips, or tongue -breathing problems -changes in vision -chills -confused, agitated, nervous -irregular or fast heartbeat -low blood pressure -seizures -tremor -trouble passing urine -unusual bleeding or bruising -unusually weak or tired Side effects that usually do not require medical attention (report to your doctor or health care professional if they continue or are bothersome): -constipation, diarrhea -drowsy -headache -loss of appetite -stomach upset, vomiting -sweating -thick mucous This list may not describe all possible side effects. Call your doctor for medical advice about side effects. You may report side effects to FDA at 1-800-FDA-1088. Where should I keep my medicine? Keep out of the reach of children. If you are using this medicine at home, you will be instructed on how to store this medicine. Throw away any unused medicine after the expiration date on the label. NOTE: This sheet is a summary. It may not cover all possible information. If you have questions about this medicine, talk to your doctor, pharmacist, or health care provider.    2016, [employer]. (2007-12-24 14:28:35) Ondansetron injection What is this medicine? ONDANSETRON (on DAN se tron) is used to treat nausea and vomiting caused by chemotherapy. It is also used to prevent or treat nausea and vomiting after surgery. This medicine may be used for other purposes;  ask  your health care provider or pharmacist if you have questions. What should I tell my health care provider before I take this medicine? They need to know if you have any of these conditions: -heart disease -history of irregular heartbeat -liver disease -low levels of magnesium or potassium in the blood -an unusual or allergic reaction to ondansetron, granisetron, other medicines, foods, dyes, or preservatives -pregnant or trying to get pregnant -breast-feeding How should I use this medicine? This medicine is for infusion into a vein. It is given by a health care professional in a hospital or clinic setting. Talk to your pediatrician regarding the use of this medicine in children. Special care may be needed. Overdosage: If you think you have taken too much of this medicine contact a poison control center or emergency room at once. NOTE: This medicine is only for you. Do not share this medicine with others. What if I miss a dose? This does not apply. What may interact with this medicine? Do not take this medicine with any of the following medications: -apomorphine -certain medicines for fungal infections like fluconazole, itraconazole, ketoconazole, posaconazole, voriconazole -cisapride -dofetilide -dronedarone -pimozide -thioridazine -ziprasidone This medicine may also interact with the following medications: -carbamazepine -certain medicines for depression, anxiety, or psychotic disturbances -fentanyl -linezolid -MAOIs like Carbex, Eldepryl, Marplan, Nardil, and Parnate -methylene blue (injected into a vein) -other medicines that prolong the QT interval (cause an abnormal heart rhythm) -phenytoin -rifampicin -tramadol This list may not describe all possible interactions. Give your health care provider a list of all the medicines, herbs, non-prescription drugs, or dietary supplements you use. Also tell them if you smoke, drink alcohol, or use illegal drugs. Some items may interact  with your medicine. What should I watch for while using this medicine? Your condition will be monitored carefully while you are receiving this medicine. What side effects may I notice from receiving this medicine? Side effects that you should report to your doctor or health care professional as soon as possible: -allergic reactions like skin rash, itching or hives, swelling of the face, lips, or tongue -breathing problems -confusion -dizziness -fast or irregular heartbeat -feeling faint or lightheaded, falls -fever and chills -loss of balance or coordination -seizures -sweating -swelling of the hands and feet -tightness in the chest -tremors -unusually weak or tired Side effects that usually do not require medical attention (report to your doctor or health care professional if they continue or are bothersome): -constipation or diarrhea -headache This list may not describe all possible side effects. Call your doctor for medical advice about side effects. You may report side effects to FDA at 1-800-FDA-1088. Where should I keep my medicine? This drug is given in a hospital or clinic and will not be stored at home. NOTE: This sheet is a summary. It may not cover all possible information. If you have questions about this medicine, talk to your doctor, pharmacist, or health care provider.    2016, [employer]. (2013-06-11 16:18:28) Dexamethasone injection What is this medicine? DEXAMETHASONE (dex a METH a sone) is a corticosteroid. It is used to treat inflammation of the skin, joints, lungs, and other organs. Common conditions treated include asthma, allergies, and arthritis. It is also used for other conditions, like blood disorders and diseases of the adrenal glands. This medicine may be used for other purposes; ask your health care provider or pharmacist if you have questions. What should I tell my health care provider before I take this medicine? They need to know if  you  have any of these conditions: -blood clotting problems -Cushing's syndrome -diabetes -glaucoma -heart problems or disease -high blood pressure -infection like herpes, measles, tuberculosis, or chickenpox -kidney disease -liver disease -mental problems -myasthenia gravis -osteoporosis -previous heart attack -seizures -stomach, ulcer or intestine disease including colitis and diverticulitis -thyroid problem -an unusual or allergic reaction to dexamethasone, corticosteroids, other medicines, lactose, foods, dyes, or preservatives -pregnant or trying to get pregnant -breast-feeding How should I use this medicine? This medicine is for injection into a muscle, joint, lesion, soft tissue, or vein. It is given by a health care professional in a hospital or clinic setting. Talk to your pediatrician regarding the use of this medicine in children. Special care may be needed. Overdosage: If you think you have taken too much of this medicine contact a poison control center or emergency room at once. NOTE: This medicine is only for you. Do not share this medicine with others. What if I miss a dose? This may not apply. If you are having a series of injections over a prolonged period, try not to miss an appointment. Call your doctor or health care professional to reschedule if you are unable to keep an appointment. What may interact with this medicine? Do not take this medicine with any of the following medications: -mifepristone, RU-486 -vaccines This medicine may also interact with the following medications: -amphotericin B -antibiotics like clarithromycin, erythromycin, and troleandomycin -aspirin and aspirin-like drugs -barbiturates like phenobarbital -carbamazepine -cholestyramine -cholinesterase inhibitors like donepezil, galantamine, rivastigmine, and tacrine -cyclosporine -digoxin -diuretics -ephedrine -female hormones, like estrogens or progestins and birth control  pills -indinavir -isoniazid -ketoconazole -medicines for diabetes -medicines that improve muscle tone or strength for conditions like myasthenia gravis -NSAIDs, medicines for pain and inflammation, like ibuprofen or naproxen -phenytoin -rifampin -thalidomide -warfarin This list may not describe all possible interactions. Give your health care provider a list of all the medicines, herbs, non-prescription drugs, or dietary supplements you use. Also tell them if you smoke, drink alcohol, or use illegal drugs. Some items may interact with your medicine. What should I watch for while using this medicine? Your condition will be monitored carefully while you are receiving this medicine. If you are taking this medicine for a long time, carry an identification card with your name and address, the type and dose of your medicine, and your doctor's name and address. This medicine may increase your risk of getting an infection. Stay away from people who are sick. Tell your doctor or health care professional if you are around anyone with measles or chickenpox. Talk to your health care provider before you get any vaccines that you take this medicine. If you are going to have surgery, tell your doctor or health care professional that you have taken this medicine within the last twelve months. Ask your doctor or health care professional about your diet. You may need to lower the amount of salt you eat. The medicine can increase your blood sugar. If you are a diabetic check with your doctor if you need help adjusting the dose of your diabetic medicine. What side effects may I notice from receiving this medicine? Side effects that you should report to your doctor or health care professional as soon as possible: -allergic reactions like skin rash, itching or hives, swelling of the face, lips, or tongue -black or tarry stools -change in the amount of urine -changes in vision -confusion, excitement, restlessness,  a false sense of well-being -fever, sore throat, sneezing, cough, or other signs  of infection, wounds that will not heal -hallucinations -increased thirst -mental depression, mood swings, mistaken feelings of self importance or of being mistreated -pain in hips, back, ribs, arms, shoulders, or legs -pain, redness, or irritation at the injection site -redness, blistering, peeling or loosening of the skin, including inside the mouth -rounding out of face -swelling of feet or lower legs -unusual bleeding or bruising -unusual tired or weak -wounds that do not heal Side effects that usually do not require medical attention (report to your doctor or health care professional if they continue or are bothersome): -diarrhea or constipation -change in taste -headache -nausea, vomiting -skin problems, acne, thin and shiny skin -touble sleeping -unusual growth of hair on the face or body -weight gain This list may not describe all possible side effects. Call your doctor for medical advice about side effects. You may report side effects to FDA at 1-800-FDA-1088. Where should I keep my medicine? This drug is given in a hospital or clinic and will not be stored at home. NOTE: This sheet is a summary. It may not cover all possible information. If you have questions about this medicine, talk to your doctor, pharmacist, or health care provider.    2016, [employer]. (2007-12-26 14:04:12) Lidocaine; Prilocaine cream What is this medicine? LIDOCAINE; PRILOCAINE (LYE doe kane; PRIL oh kane) is a topical anesthetic that causes loss of feeling in the skin and surrounding tissues. It is used to numb the skin before procedures or injections. This medicine may be used for other purposes; ask your health care provider or pharmacist if you have questions. What should I tell my health care provider before I take this medicine? They need to know if you have any of these  conditions: -glucose-6-phosphate deficiencies -heart disease -kidney or liver disease -methemoglobinemia -an unusual or allergic reaction to lidocaine, prilocaine, other medicines, foods, dyes, or preservatives -pregnant or trying to get pregnant -breast-feeding How should I use this medicine? This medicine is for external use only on the skin. Do not take by mouth. Follow the directions on the prescription label. Wash hands before and after use. Do not use more or leave in contact with the skin longer than directed. Do not apply to eyes or open wounds. It can cause irritation and blurred or temporary loss of vision. If this medicine comes in contact with your eyes, immediately rinse the eye with water. Do not touch or rub the eye. Contact your health care provider right away. Talk to your pediatrician regarding the use of this medicine in children. While this medicine may be prescribed for children for selected conditions, precautions do apply. Overdosage: If you think you have taken too much of this medicine contact a poison control center or emergency room at once. NOTE: This medicine is only for you. Do not share this medicine with others. What if I miss a dose? This medicine is usually only applied once prior to each procedure. It must be in contact with the skin for a period of time for it to work. If you applied this medicine later than directed, tell your health care professional before starting the procedure. What may interact with this medicine? -acetaminophen -chloroquine -dapsone -medicines to control heart rhythm -nitrates like nitroglycerin and nitroprusside -other ointments, creams, or sprays that may contain anesthetic medicine -phenobarbital -phenytoin -quinine -sulfonamides like sulfacetamide, sulfamethoxazole, sulfasalazine and others This list may not describe all possible interactions. Give your health care provider a list of all the medicines, herbs, non-prescription  drugs, or  dietary supplements you use. Also tell them if you smoke, drink alcohol, or use illegal drugs. Some items may interact with your medicine. What should I watch for while using this medicine? Be careful to avoid injury to the treated area while it is numb and you are not aware of pain. Avoid scratching, rubbing, or exposing the treated area to hot or cold temperatures until complete sensation has returned. The numb feeling will wear off a few hours after applying the cream. What side effects may I notice from receiving this medicine? Side effects that you should report to your doctor or health care professional as soon as possible: -blurred vision -chest pain -difficulty breathing -dizziness -drowsiness -fast or irregular heartbeat -skin rash or itching -swelling of your throat, lips, or face -trembling Side effects that usually do not require medical attention (report to your doctor or health care professional if they continue or are bothersome): -changes in ability to feel hot or cold -redness and swelling at the application site This list may not describe all possible side effects. Call your doctor for medical advice about side effects. You may report side effects to FDA at 1-800-FDA-1088. Where should I keep my medicine? Keep out of reach of children. Store at room temperature between 15 and 30 degrees C (59 and 86 degrees F). Keep container tightly closed. Throw away any unused medicine after the expiration date. NOTE: This sheet is a summary. It may not cover all possible information. If you have questions about this medicine, talk to your doctor, pharmacist, or health care provider.    2016, [employer]. (2008-03-09 17:14:35) Lidocaine; Prilocaine cream What is this medicine? LIDOCAINE; PRILOCAINE (LYE doe kane; PRIL oh kane) is a topical anesthetic that causes loss of feeling in the skin and surrounding tissues. It is used to numb the skin before procedures or  injections. This medicine may be used for other purposes; ask your health care provider or pharmacist if you have questions. What should I tell my health care provider before I take this medicine? They need to know if you have any of these conditions: -glucose-6-phosphate deficiencies -heart disease -kidney or liver disease -methemoglobinemia -an unusual or allergic reaction to lidocaine, prilocaine, other medicines, foods, dyes, or preservatives -pregnant or trying to get pregnant -breast-feeding How should I use this medicine? This medicine is for external use only on the skin. Do not take by mouth. Follow the directions on the prescription label. Wash hands before and after use. Do not use more or leave in contact with the skin longer than directed. Do not apply to eyes or open wounds. It can cause irritation and blurred or temporary loss of vision. If this medicine comes in contact with your eyes, immediately rinse the eye with water. Do not touch or rub the eye. Contact your health care provider right away. Talk to your pediatrician regarding the use of this medicine in children. While this medicine may be prescribed for children for selected conditions, precautions do apply. Overdosage: If you think you have taken too much of this medicine contact a poison control center or emergency room at once. NOTE: This medicine is only for you. Do not share this medicine with others. What if I miss a dose? This medicine is usually only applied once prior to each procedure. It must be in contact with the skin for a period of time for it to work. If you applied this medicine later than directed, tell your health care professional before starting the procedure.  What may interact with this medicine? -acetaminophen -chloroquine -dapsone -medicines to control heart rhythm -nitrates like nitroglycerin and nitroprusside -other ointments, creams, or sprays that may contain anesthetic  medicine -phenobarbital -phenytoin -quinine -sulfonamides like sulfacetamide, sulfamethoxazole, sulfasalazine and others This list may not describe all possible interactions. Give your health care provider a list of all the medicines, herbs, non-prescription drugs, or dietary supplements you use. Also tell them if you smoke, drink alcohol, or use illegal drugs. Some items may interact with your medicine. What should I watch for while using this medicine? Be careful to avoid injury to the treated area while it is numb and you are not aware of pain. Avoid scratching, rubbing, or exposing the treated area to hot or cold temperatures until complete sensation has returned. The numb feeling will wear off a few hours after applying the cream. What side effects may I notice from receiving this medicine? Side effects that you should report to your doctor or health care professional as soon as possible: -blurred vision -chest pain -difficulty breathing -dizziness -drowsiness -fast or irregular heartbeat -skin rash or itching -swelling of your throat, lips, or face -trembling Side effects that usually do not require medical attention (report to your doctor or health care professional if they continue or are bothersome): -changes in ability to feel hot or cold -redness and swelling at the application site This list may not describe all possible side effects. Call your doctor for medical advice about side effects. You may report side effects to FDA at 1-800-FDA-1088. Where should I keep my medicine? Keep out of reach of children. Store at room temperature between 15 and 30 degrees C (59 and 86 degrees F). Keep container tightly closed. Throw away any unused medicine after the expiration date. NOTE: This sheet is a summary. It may not cover all possible information. If you have questions about this medicine, talk to your doctor, pharmacist, or health care provider.    2016, [employer].  (2008-03-09 17:14:35) Ondansetron tablets What is this medicine? ONDANSETRON (on DAN se tron) is used to treat nausea and vomiting caused by chemotherapy. It is also used to prevent or treat nausea and vomiting after surgery. This medicine may be used for other purposes; ask your health care provider or pharmacist if you have questions. What should I tell my health care provider before I take this medicine? They need to know if you have any of these conditions: -heart disease -history of irregular heartbeat -liver disease -low levels of magnesium or potassium in the blood -an unusual or allergic reaction to ondansetron, granisetron, other medicines, foods, dyes, or preservatives -pregnant or trying to get pregnant -breast-feeding How should I use this medicine? Take this medicine by mouth with a glass of water. Follow the directions on your prescription label. Take your doses at regular intervals. Do not take your medicine more often than directed. Talk to your pediatrician regarding the use of this medicine in children. Special care may be needed. Overdosage: If you think you have taken too much of this medicine contact a poison control center or emergency room at once. NOTE: This medicine is only for you. Do not share this medicine with others. What if I miss a dose? If you miss a dose, take it as soon as you can. If it is almost time for your next dose, take only that dose. Do not take double or extra doses. What may interact with this medicine? Do not take this medicine with any of the following  medications: -apomorphine -certain medicines for fungal infections like fluconazole, itraconazole, ketoconazole, posaconazole, voriconazole -cisapride -dofetilide -dronedarone -pimozide -thioridazine -ziprasidone This medicine may also interact with the following medications: -carbamazepine -certain medicines for depression, anxiety, or psychotic  disturbances -fentanyl -linezolid -MAOIs like Carbex, Eldepryl, Marplan, Nardil, and Parnate -methylene blue (injected into a vein) -other medicines that prolong the QT interval (cause an abnormal heart rhythm) -phenytoin -rifampicin -tramadol This list may not describe all possible interactions. Give your health care provider a list of all the medicines, herbs, non-prescription drugs, or dietary supplements you use. Also tell them if you smoke, drink alcohol, or use illegal drugs. Some items may interact with your medicine. What should I watch for while using this medicine? Check with your doctor or health care professional right away if you have any sign of an allergic reaction. What side effects may I notice from receiving this medicine? Side effects that you should report to your doctor or health care professional as soon as possible: -allergic reactions like skin rash, itching or hives, swelling of the face, lips or tongue -breathing problems -confusion -dizziness -fast or irregular heartbeat -feeling faint or lightheaded, falls -fever and chills -loss of balance or coordination -seizures -sweating -swelling of the hands or feet -tightness in the chest -tremors -unusually weak or tired Side effects that usually do not require medical attention (report to your doctor or health care professional if they continue or are bothersome): -constipation or diarrhea -headache This list may not describe all possible side effects. Call your doctor for medical advice about side effects. You may report side effects to FDA at 1-800-FDA-1088. Where should I keep my medicine? Keep out of the reach of children. Store between 2 and 30 degrees C (36 and 86 degrees F). Throw away any unused medicine after the expiration date. NOTE: This sheet is a summary. It may not cover all possible information. If you have questions about this medicine, talk to your doctor, pharmacist, or health care  provider.    2016, [employer]. (2013-06-11 16:27:45) Dexamethasone tablets What is this medicine? DEXAMETHASONE (dex a METH a sone) is a corticosteroid. It is commonly used to treat inflammation of the skin, joints, lungs, and other organs. Common conditions treated include asthma, allergies, and arthritis. It is also used for other conditions, such as blood disorders and diseases of the adrenal glands. This medicine may be used for other purposes; ask your health care provider or pharmacist if you have questions. What should I tell my health care provider before I take this medicine? They need to know if you have any of these conditions: -Cushing's syndrome -diabetes -glaucoma -heart problems or disease -high blood pressure -infection like herpes, measles, tuberculosis, or chickenpox -kidney disease -liver disease -mental problems -myasthenia gravis -osteoporosis -previous heart attack -seizures -stomach, ulcer or intestine disease including colitis and diverticulitis -thyroid problem -an unusual or allergic reaction to dexamethasone, corticosteroids, other medicines, lactose, foods, dyes, or preservatives -pregnant or trying to get pregnant -breast-feeding How should I use this medicine? Take this medicine by mouth with a drink of water. Follow the directions on the prescription label. Take it with food or milk to avoid stomach upset. If you are taking this medicine once a day, take it in the morning. Do not take more medicine than you are told to take. Do not suddenly stop taking your medicine because you may develop a severe reaction. Your doctor will tell you how much medicine to take. If your doctor wants you to stop  the medicine, the dose may be slowly lowered over time to avoid any side effects. Talk to your pediatrician regarding the use of this medicine in children. Special care may be needed. Patients over 15 years old may have a stronger reaction and need a  smaller dose. Overdosage: If you think you have taken too much of this medicine contact a poison control center or emergency room at once. NOTE: This medicine is only for you. Do not share this medicine with others. What if I miss a dose? If you miss a dose, take it as soon as you can. If it is almost time for your next dose, talk to your doctor or health care professional. You may need to miss a dose or take an extra dose. Do not take double or extra doses without advice. What may interact with this medicine? Do not take this medicine with any of the following medications: -mifepristone, RU-486 -vaccines This medicine may also interact with the following medications: -amphotericin B -antibiotics like clarithromycin, erythromycin, and troleandomycin -aspirin and aspirin-like drugs -barbiturates like phenobarbital -carbamazepine -cholestyramine -cholinesterase inhibitors like donepezil, galantamine, rivastigmine, and tacrine -cyclosporine -digoxin -diuretics -ephedrine -female hormones, like estrogens or progestins and birth control pills -indinavir -isoniazid -ketoconazole -medicines for diabetes -medicines that improve muscle tone or strength for conditions like myasthenia gravis -NSAIDs, medicines for pain and inflammation, like ibuprofen or naproxen -phenytoin -rifampin -thalidomide -warfarin This list may not describe all possible interactions. Give your health care provider a list of all the medicines, herbs, non-prescription drugs, or dietary supplements you use. Also tell them if you smoke, drink alcohol, or use illegal drugs. Some items may interact with your medicine. What should I watch for while using this medicine? Visit your doctor or health care professional for regular checks on your progress. If you are taking this medicine over a prolonged period, carry an identification card with your name and address, the type and dose of your medicine, and your doctor's name and  address. This medicine may increase your risk of getting an infection. Stay away from people who are sick. Tell your doctor or health care professional if you are around anyone with measles or chickenpox. If you are going to have surgery, tell your doctor or health care professional that you have taken this medicine within the last twelve months. Ask your doctor or health care professional about your diet. You may need to lower the amount of salt you eat. The medicine can increase your blood sugar. If you are a diabetic check with your doctor if you need help adjusting the dose of your diabetic medicine. What side effects may I notice from receiving this medicine? Side effects that you should report to your doctor or health care professional as soon as possible: -allergic reactions like skin rash, itching or hives, swelling of the face, lips, or tongue -changes in vision -fever, sore throat, sneezing, cough, or other signs of infection, wounds that will not heal -increased thirst -mental depression, mood swings, mistaken feelings of self importance or of being mistreated -pain in hips, back, ribs, arms, shoulders, or legs -redness, blistering, peeling or loosening of the skin, including inside the mouth -trouble passing urine or change in the amount of urine -swelling of feet or lower legs -unusual bleeding or bruising Side effects that usually do not require medical attention (report to your doctor or health care professional if they continue or are bothersome): -headache -nausea, vomiting -skin problems, acne, thin and shiny skin -weight gain This list  may not describe all possible side effects. Call your doctor for medical advice about side effects. You may report side effects to FDA at 1-800-FDA-1088. Where should I keep my medicine? Keep out of the reach of children. Store at room temperature between 20 and 25 degrees C (68 and 77 degrees F). Protect from light. Throw away any unused  medicine after the expiration date. NOTE: This sheet is a summary. It may not cover all possible information. If you have questions about this medicine, talk to your doctor, pharmacist, or health care provider.    2016, [employer]. (2007-12-26 14:02:13) Dexamethasone tablets What is this medicine? DEXAMETHASONE (dex a METH a sone) is a corticosteroid. It is commonly used to treat inflammation of the skin, joints, lungs, and other organs. Common conditions treated include asthma, allergies, and arthritis. It is also used for other conditions, such as blood disorders and diseases of the adrenal glands. This medicine may be used for other purposes; ask your health care provider or pharmacist if you have questions. What should I tell my health care provider before I take this medicine? They need to know if you have any of these conditions: -Cushing's syndrome -diabetes -glaucoma -heart problems or disease -high blood pressure -infection like herpes, measles, tuberculosis, or chickenpox -kidney disease -liver disease -mental problems -myasthenia gravis -osteoporosis -previous heart attack -seizures -stomach, ulcer or intestine disease including colitis and diverticulitis -thyroid problem -an unusual or allergic reaction to dexamethasone, corticosteroids, other medicines, lactose, foods, dyes, or preservatives -pregnant or trying to get pregnant -breast-feeding How should I use this medicine? Take this medicine by mouth with a drink of water. Follow the directions on the prescription label. Take it with food or milk to avoid stomach upset. If you are taking this medicine once a day, take it in the morning. Do not take more medicine than you are told to take. Do not suddenly stop taking your medicine because you may develop a severe reaction. Your doctor will tell you how much medicine to take. If your doctor wants you to stop the medicine, the dose may be slowly lowered over time  to avoid any side effects. Talk to your pediatrician regarding the use of this medicine in children. Special care may be needed. Patients over 55 years old may have a stronger reaction and need a smaller dose. Overdosage: If you think you have taken too much of this medicine contact a poison control center or emergency room at once. NOTE: This medicine is only for you. Do not share this medicine with others. What if I miss a dose? If you miss a dose, take it as soon as you can. If it is almost time for your next dose, talk to your doctor or health care professional. You may need to miss a dose or take an extra dose. Do not take double or extra doses without advice. What may interact with this medicine? Do not take this medicine with any of the following medications: -mifepristone, RU-486 -vaccines This medicine may also interact with the following medications: -amphotericin B -antibiotics like clarithromycin, erythromycin, and troleandomycin -aspirin and aspirin-like drugs -barbiturates like phenobarbital -carbamazepine -cholestyramine -cholinesterase inhibitors like donepezil, galantamine, rivastigmine, and tacrine -cyclosporine -digoxin -diuretics -ephedrine -female hormones, like estrogens or progestins and birth control pills -indinavir -isoniazid -ketoconazole -medicines for diabetes -medicines that improve muscle tone or strength for conditions like myasthenia gravis -NSAIDs, medicines for pain and inflammation, like ibuprofen or naproxen -phenytoin -rifampin -thalidomide -warfarin This list may not describe all  possible interactions. Give your health care provider a list of all the medicines, herbs, non-prescription drugs, or dietary supplements you use. Also tell them if you smoke, drink alcohol, or use illegal drugs. Some items may interact with your medicine. What should I watch for while using this medicine? Visit your doctor or health care professional for regular  checks on your progress. If you are taking this medicine over a prolonged period, carry an identification card with your name and address, the type and dose of your medicine, and your doctor's name and address. This medicine may increase your risk of getting an infection. Stay away from people who are sick. Tell your doctor or health care professional if you are around anyone with measles or chickenpox. If you are going to have surgery, tell your doctor or health care professional that you have taken this medicine within the last twelve months. Ask your doctor or health care professional about your diet. You may need to lower the amount of salt you eat. The medicine can increase your blood sugar. If you are a diabetic check with your doctor if you need help adjusting the dose of your diabetic medicine. What side effects may I notice from receiving this medicine? Side effects that you should report to your doctor or health care professional as soon as possible: -allergic reactions like skin rash, itching or hives, swelling of the face, lips, or tongue -changes in vision -fever, sore throat, sneezing, cough, or other signs of infection, wounds that will not heal -increased thirst -mental depression, mood swings, mistaken feelings of self importance or of being mistreated -pain in hips, back, ribs, arms, shoulders, or legs -redness, blistering, peeling or loosening of the skin, including inside the mouth -trouble passing urine or change in the amount of urine -swelling of feet or lower legs -unusual bleeding or bruising Side effects that usually do not require medical attention (report to your doctor or health care professional if they continue or are bothersome): -headache -nausea, vomiting -skin problems, acne, thin and shiny skin -weight gain This list may not describe all possible side effects. Call your doctor for medical advice about side effects. You may report side effects to FDA at  1-800-FDA-1088. Where should I keep my medicine? Keep out of the reach of children. Store at room temperature between 20 and 25 degrees C (68 and 77 degrees F). Protect from light. Throw away any unused medicine after the expiration date. NOTE: This sheet is a summary. It may not cover all possible information. If you have questions about this medicine, talk to your doctor, pharmacist, or health care provider.    2016, [employer]. (2007-12-26 14:02:13) Prochlorperazine tablets What is this medicine? PROCHLORPERAZINE (proe klor PER a zeen) helps to control severe nausea and vomiting. This medicine is also used to treat schizophrenia. It can also help patients who experience anxiety that is not due to psychological illness. This medicine may be used for other purposes; ask your health care provider or pharmacist if you have questions. What should I tell my health care provider before I take this medicine? They need to know if you have any of these conditions: -blood disorders or disease -dementia -liver disease or jaundice -Parkinson's disease -uncontrollable movement disorder -an unusual or allergic reaction to prochlorperazine, other medicines, foods, dyes, or preservatives -pregnant or trying to get pregnant -breast-feeding How should I use this medicine? Take this medicine by mouth with a glass of water. Follow the directions on the prescription label. Take  your doses at regular intervals. Do not take your medicine more often than directed. Do not stop taking this medicine suddenly. This can cause nausea, vomiting, and dizziness. Ask your doctor or health care professional for advice. Talk to your pediatrician regarding the use of this medicine in children. Special care may be needed. While this drug may be prescribed for children as young as 2 years for selected conditions, precautions do apply. Overdosage: If you think you have taken too much of this medicine contact a poison  control center or emergency room at once. NOTE: This medicine is only for you. Do not share this medicine with others. What if I miss a dose? If you miss a dose, take it as soon as you can. If it is almost time for your next dose, take only that dose. Do not take double or extra doses. What may interact with this medicine? Do not take this medicine with any of the following medications: -amoxapine -antidepressants like citalopram, escitalopram, fluoxetine, paroxetine, and sertraline -deferoxamine -dofetilide -maprotiline -tricyclic antidepressants like amitriptyline, clomipramine, imipramine, nortiptyline and others This medicine may also interact with the following medications: -lithium -medicines for pain -phenytoin -propranolol -warfarin This list may not describe all possible interactions. Give your health care provider a list of all the medicines, herbs, non-prescription drugs, or dietary supplements you use. Also tell them if you smoke, drink alcohol, or use illegal drugs. Some items may interact with your medicine. What should I watch for while using this medicine? Visit your doctor or health care professional for regular checks on your progress. You may get drowsy or dizzy. Do not drive, use machinery, or do anything that needs mental alertness until you know how this medicine affects you. Do not stand or sit up quickly, especially if you are an older patient. This reduces the risk of dizzy or fainting spells. Alcohol may interfere with the effect of this medicine. Avoid alcoholic drinks. This medicine can reduce the response of your body to heat or cold. Dress warm in cold weather and stay hydrated in hot weather. If possible, avoid extreme temperatures like saunas, hot tubs, very hot or cold showers, or activities that can cause dehydration such as vigorous exercise. This medicine can make you more sensitive to the sun. Keep out of the sun. If you cannot avoid being in the sun, wear  protective clothing and use sunscreen. Do not use sun lamps or tanning beds/booths. Your mouth may get dry. Chewing sugarless gum or sucking hard candy, and drinking plenty of water may help. Contact your doctor if the problem does not go away or is severe. What side effects may I notice from receiving this medicine? Side effects that you should report to your doctor or health care professional as soon as possible: -blurred vision -breast enlargement in men or women -breast milk in women who are not breast-feeding -chest pain, fast or irregular heartbeat -confusion, restlessness -dark yellow or brown urine -difficulty breathing or swallowing -dizziness or fainting spells -drooling, shaking, movement difficulty (shuffling walk) or rigidity -fever, chills, sore throat -involuntary or uncontrollable movements of the eyes, mouth, head, arms, and legs -seizures -stomach area pain -unusually weak or tired -unusual bleeding or bruising -yellowing of skin or eyes Side effects that usually do not require medical attention (report to your doctor or health care professional if they continue or are bothersome): -difficulty passing urine -difficulty sleeping -headache -sexual dysfunction -skin rash, or itching This list may not describe all possible side effects. Call your  doctor for medical advice about side effects. You may report side effects to FDA at 1-800-FDA-1088. Where should I keep my medicine? Keep out of the reach of children. Store at room temperature between 15 and 30 degrees C (59 and 86 degrees F). Protect from light. Throw away any unused medicine after the expiration date. NOTE: This sheet is a summary. It may not cover all possible information. If you have questions about this medicine, talk to your doctor, pharmacist, or health care provider.    2016, [employer]. (2012-01-23 16:59:39)

## 2015-11-17 ENCOUNTER — Encounter (HOSPITAL_COMMUNITY): Payer: Self-pay | Admitting: Hematology & Oncology

## 2015-11-17 ENCOUNTER — Encounter (HOSPITAL_COMMUNITY): Payer: PPO

## 2015-11-17 ENCOUNTER — Encounter (HOSPITAL_COMMUNITY): Payer: PPO | Attending: Hematology & Oncology | Admitting: Hematology & Oncology

## 2015-11-17 VITALS — BP 141/70 | HR 68 | Temp 97.7°F | Resp 18 | Wt 165.0 lb

## 2015-11-17 DIAGNOSIS — Z853 Personal history of malignant neoplasm of breast: Secondary | ICD-10-CM | POA: Diagnosis not present

## 2015-11-17 DIAGNOSIS — C342 Malignant neoplasm of middle lobe, bronchus or lung: Secondary | ICD-10-CM

## 2015-11-17 DIAGNOSIS — Z9889 Other specified postprocedural states: Secondary | ICD-10-CM | POA: Insufficient documentation

## 2015-11-17 DIAGNOSIS — Z7982 Long term (current) use of aspirin: Secondary | ICD-10-CM | POA: Insufficient documentation

## 2015-11-17 DIAGNOSIS — K219 Gastro-esophageal reflux disease without esophagitis: Secondary | ICD-10-CM | POA: Insufficient documentation

## 2015-11-17 DIAGNOSIS — C50911 Malignant neoplasm of unspecified site of right female breast: Secondary | ICD-10-CM

## 2015-11-17 DIAGNOSIS — E7800 Pure hypercholesterolemia, unspecified: Secondary | ICD-10-CM | POA: Insufficient documentation

## 2015-11-17 DIAGNOSIS — Z90710 Acquired absence of both cervix and uterus: Secondary | ICD-10-CM | POA: Insufficient documentation

## 2015-11-17 DIAGNOSIS — I10 Essential (primary) hypertension: Secondary | ICD-10-CM | POA: Insufficient documentation

## 2015-11-17 DIAGNOSIS — Z923 Personal history of irradiation: Secondary | ICD-10-CM | POA: Insufficient documentation

## 2015-11-17 DIAGNOSIS — R590 Localized enlarged lymph nodes: Secondary | ICD-10-CM | POA: Insufficient documentation

## 2015-11-17 DIAGNOSIS — Z79899 Other long term (current) drug therapy: Secondary | ICD-10-CM | POA: Insufficient documentation

## 2015-11-17 DIAGNOSIS — R599 Enlarged lymph nodes, unspecified: Secondary | ICD-10-CM

## 2015-11-17 DIAGNOSIS — Z9221 Personal history of antineoplastic chemotherapy: Secondary | ICD-10-CM | POA: Insufficient documentation

## 2015-11-17 NOTE — Progress Notes (Signed)
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 ER +90%, PR 30% Her -2 neu indeterminate. Surgery, XRT, hormonal therapy. Intolerant of Tamoxifen, arimidex X 5 years completed 02/15/2005 CT abdomen 12/18/2014 6 mm RML pulmonary nodule, 17 mm nodule in LL Base was 31m previously PET/CT 7 mm RML nodule hypermetabolic, hypermetabolic activity along the posterior inferior margin of the R mainstem bronchus, non-enlarged subcarinal LN EBUS 01/19/2015 with Dr. VPrescott Gum Lung, core biopsy RML on 07/15/2015 biopsy positive for poorly differentiated carcinoma, staining pattern favors poorly differentiated squamous cell carcinoma.  Wang needle FNA 7 Node #2, no malignant cells PET imaging with R infrahilar and subcarinal adenopathy   CURRENT THERAPY:  To begin concurrent chemo/XRT  INTERVAL HISTORY: Kari [id number]. female returns today for additional f/u of a recently found 7 mm right middle lobe nodule that is hypermetabolic on PET imaging with a hypermetabolic subcarinal lymph node concerning for malignancy found incidentally after a CT abdomen was performed by GI for periumbilical pain, diarrhea, and weight loss x 1 month.  [facility] right [facility], grade 2 (T1 A., N1 a) with 2 of 4 positive lymph nodes, ER +90%, PR +30%, lymph node metastases were 1 mm or slightly less. HER-2/neu was indeterminant at 2+. She was diagnosed in February 2001 treated with surgery followed by radiation therapy and then hormonal therapy. She could not tolerate tamoxifen so was switched to Arimidex which she took for total 5 years ending on 02/15/2005, no evidence of disease.  Pathology from the LN biopsy was negative for malignancy, bronchial washings showed abnormal cells. Biopsy on  10/27/2/016 of the RML was positive for poorly differentiated carcinoma, with staining pattern c/w squamous cell carcinoma.   She underwent XRT with [name] 09/08/15-09/15/15: Right middle lobe/ 54 Gy at 18 GY per fraction x 3 fractions. Unfortunately repeat imaging has shown disease within the mediastinal and infrahilar regions. She has opted for concurrent therapy.   Mrs. TAlegriais accompanied by her husband and eldest daughter. I personally went over future treatment plans with the patient, including radiation treatment and chemotherapy. She will begin radiation treatment next week with Dr. WPablo Ledger She is here today for chemotherapy teaching. Port has been placed.   Past Medical History  Diagnosis Date  . Hypertension   . [place] ([facility]     [place] RT breat lumpectomy rad tx  . GERD (gastroesophageal reflux disease)   . Memory loss   . Pure hypercholesterolemia   . Memory change 10/24/2013  . Infiltrating ductal carcinoma of right breast ([facility] [place] 01/09/2015  . Pulmonary nodule 01/09/2015  . Headache     years ago, none since hysterectomy  . Anemia     in the past  . Diarrhea   . Complication of anesthesia     confusion  . Lung cancer ([facility]     has Memory change; Periumbilical pain; Diarrhea; GERD (gastroesophageal reflux disease); Infiltrating ductal carcinoma of right breast ([place]; Pulmonary nodule; Diverticulosis of colon without hemorrhage; Chronic diarrhea; Mediastinal adenopathy; Mucosal abnormality of stomach; and Primary cancer of right middle lobe of lung [place].     is allergic to codeine; morphine and related; adhesive; sulfur; and tamoxifen.  Ms. TRosadodoes not  currently have medications on file.  Past Surgical History  Procedure Laterality Date  . Abdominal hysterectomy    . Bilateral oophorectomy  2007  . Incision and drainage of wound  2011    buttocks  . Tonsillectomy    . Cataract extraction Bilateral     lens implant  . Carpal  tunnel release Right   . Colonoscopy  2011    RMR: 1. Anal papilla, otherwise normal rectum. 2. Left-sided diverticula, single diminutive polyp in the sigmoid segment status post cold biospy removal. Remainder of the colonic mucosa appeared normal.   . Colonoscopy N/A 01/14/2015    Procedure: COLONOSCOPY;  Surgeon: Daneil Dolin, MD;  Location[facility];  Service: Endoscopy;  Laterality: N/A;  930 - moved to 10:15  . Esophagogastroduodenoscopy N/A 01/14/2015    RMR: Colonic diverticulosis. status post segmental biospy and stool sampling.  No egd done today  . Breast surgery Right     right lumpectomy, lymph glands remove  . Video bronchoscopy with endobronchial ultrasound N/A 01/19/2015    Procedure: VIDEO BRONCHOSCOPY WITH ENDOBRONCHIAL ULTRASOUND;  Surgeon: Kari Poot, MD;  Location: [facility] OR;  Service: Thoracic;  Laterality: N/A;  . Esophagogastroduodenoscopy N/A 02/11/2015    RMR: Focally abnormal antrum of doubtful clinical significance- Status post biopsy.   . Video bronchoscopy with endobronchial ultrasound N/A 07/27/2015    Procedure: VIDEO BRONCHOSCOPY WITH ENDOBRONCHIAL ULTRASOUND;  Surgeon: Kari Poot, MD;  Location: [facility] OR;  Service: Thoracic;  Laterality: N/A;    Denies any headaches, dizziness, double vision, fevers, chills, night sweats, nausea, vomiting, diarrhea, constipation, chest pain, heart palpitations, shortness of breath, blood in stool, black tarry stool, urinary pain, urinary burning, urinary frequency, hematuria, appetite loss.  14 point review of systems was performed and is negative except as detailed under history of present illness and above    PHYSICAL EXAMINATION  ECOG PERFORMANCE STATUS: 1 - Symptomatic but completely ambulatory  Filed Vitals:   11/17/15 1215  BP: 141/70  Pulse: 68  Temp: 97.7 F (36.5 C)  Resp: 18    GENERAL:alert, no distress, well nourished, well developed, comfortable, cooperative, obese, smiling and accompanied by her  eldest daughter and husband. SKIN: skin color, texture, turgor are normal, no rashes or significant lesions. Slight bruising over recently placed port site HEAD: Normocephalic, No masses, lesions, tenderness or abnormalities EYES: normal, PERRLA, EOMI, Conjunctiva are pink and non-injected EARS: External ears normal OROPHARYNX:lips, buccal mucosa, and tongue normal and mucous membranes are moist  NECK: supple, no adenopathy, thyroid normal size, non-tender, without nodularity, trachea midline LYMPH:  no palpable lymphadenopathy BREAST:not examined LUNGS: clear to auscultation  HEART: regular rate & rhythm, no murmurs, no gallops, S1 normal and S2 normal ABDOMEN:abdomen soft, non-tender, obese and normal bowel sounds BACK: Back symmetric, no curvature., No CVA tenderness EXTREMITIES:less then 2 second capillary refill, no joint deformities, effusion, or inflammation, no skin discoloration, no clubbing, no cyanosis  NEURO: alert & oriented x 3 with fluent speech, no focal motor/sensory deficits, gait normal   LABORATORY DATA: I have reviewed the data as listed. CBC    Component Value Date/Time   WBC 6.0 11/15/2015 1233   RBC 4.54 11/15/2015 1233   HGB 12.8 11/15/2015 1233   HCT 40.3 11/15/2015 1233   PLT 171 11/15/2015 1233   MCV 88.8 11/15/2015 1233   MCH 28.2 11/15/2015 1233   MCHC 31.8 11/15/2015 1233   RDW 13.6 11/15/2015 1233   LYMPHSABS 1.3 11/15/2015 1233   MONOABS  0.5 11/15/2015 1233   EOSABS 0.2 11/15/2015 1233   BASOSABS 0.1 11/15/2015 1233      Chemistry      Component Value Date/Time   NA 138 11/15/2015 1233   K 3.5 11/15/2015 1233   CL 104 11/15/2015 1233   CO2 25 11/15/2015 1233   BUN 19 11/15/2015 1233   CREATININE 0.64 11/15/2015 1233   CREATININE 0.69 12/16/2014 1058      Component Value Date/Time   CALCIUM 9.8 11/15/2015 1233   ALKPHOS 60 01/19/2015 0817   AST 22 01/19/2015 0817   ALT 18 01/19/2015 0817   BILITOT 1.1 01/19/2015 0817      RADIOGRAPHY: I have personally reviewed the radiological images as listed and agreed with the findings in the report. Study Result     CLINICAL DATA: Subsequent treatment strategy for right lung cancer (right middle lobe).  EXAM: NUCLEAR MEDICINE PET SKULL BASE TO THIGH  TECHNIQUE: 8.1 mCi F-18 FDG was injected intravenously. Full-ring PET imaging was performed from the skull base to thigh after the radiotracer. CT data was obtained and used for attenuation correction and anatomic localization.  FASTING BLOOD GLUCOSE: Value: 103 mg/dl  COMPARISON: 07/15/2015  FINDINGS: NECK  No hypermetabolic lymph nodes in the neck.  CHEST  Subcarinal node 2 cm diameter, maximum standard uptake value the 30.5. This is partially continuous with a right infrahilar node measuring 1.2 cm in diameter with maximum standard uptake value 27.5.  In the space previously occupied by the right middle lobe nodule there is only some indistinct architectural distortion/scar-like appearance, without significant residual hypermetabolic activity at the site of the nodule. No significant abnormal hypermetabolic activity associated with the 7 mm lingular nodule currently observed. The density along the lower lingula is probably atelectasis adjacent to epicardial adipose tissue and not hypermetabolic.  ABDOMEN/PELVIS  Gastric antrum activity is likely physiologic. Similarly there is physiologic activity in the ascending colon. Sigmoid diverticulosis. No findings of active malignancy in the abdomen or pelvis.  SKELETON  No focal hypermetabolic activity to suggest skeletal metastasis. Degenerative glenohumeral arthropathy along with cervical spondylosis. Mid to lower thoracic spondylosis and lumbar spondylosis.  IMPRESSION: 1. Prominent and hypermetabolic subcarinal and right infrahilar adenopathy compatible with malignancy, both the size and metabolic activity of these lymph  nodes is significantly increased from April 2016. 2. There is some scarring in the vicinity of the original right middle lobe nodule, but without residual hypermetabolic activity in this vicinity. 3. 7 mm lingular nodule does not currently demonstrate hypermetabolic activity, but is below sensitive PET-CT size thresholds. No change in the size of this nodule from 12/31/2014.   Electronically Signed  By: Van Clines M.D.  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   She will begin radiation treatment next week on 11/22/2015 with Dr. Pablo Ledger. Plan is for concurrent weekly carboplatin/taxol. She is here today for chemotherapy teaching.  She will be seen weekly for assessment of tolerance.  The patient has a MRI of the brain scheduled for 11/23/2015.  I will refer the patient to [facility] for a nutrition consult, weight will be followed closely throughout therapy.  We discussed the possibility of XRT induced esophagitis and ways we can manage this moving forward.  She will return this coming Monday, 11/22/2015, to begin chemotherapy treatment and return for follow up on Monday, 11/29/2015.   All questions were answered. The patient knows to call the clinic with any problems, questions or concerns. We can certainly see the patient much sooner if necessary.  This document serves as a record of services  personally performed by Ancil Linsey, MD. It was created on her behalf by Arlyce Harman, a trained medical scribe. The creation of this record is based on the scribe's personal observations and the provider's statements to them. This document has been checked and approved by the attending provider.  I have reviewed the above documentation for accuracy and completeness, and I agree with the above. Molli Hazard, MD

## 2015-11-17 NOTE — Patient Instructions (Signed)
[place] at [facility] Discharge Instructions  RECOMMENDATIONS MADE BY THE CONSULTANT AND ANY TEST RESULTS WILL BE SENT TO YOUR REFERRING PHYSICIAN.   Exam and discussion by Dr Whitney Muse today Carbo/taxol every week  [employer] milkshakes are good and fattening  Boost/Ensure/carnation good start ([employer]mart or drug store) are also good to drink We can give you fluids if you feel like you need them Your port site looks good, you will apply the emla cream before you come in for treatment on Monday. Radiation Monday-Friday Return to see the doctor as scheduled  Please call the clinic if you have any questions or concerns      Thank you for choosing [place] at [facility] to provide your oncology and hematology care.  To afford each patient quality time with our provider, please arrive at least 15 minutes before your scheduled appointment time.   Beginning January 23rd 2017 lab work for the [facility] will be done in the  Main lab at [facility] on 1st floor. If you have a lab appointment with the [facility] please come in thru the  Main Entrance and check in at the main information desk  You need to re-schedule your appointment should you arrive 10 or more minutes late.  We strive to give you quality time with our providers, and arriving late affects you and other patients whose appointments are after yours.  Also, if you no show three or more times for appointments you may be dismissed from the clinic at the providers discretion.     Again, thank you for choosing [facility].  Our hope is that these requests will decrease the amount of time that you wait before being seen by our physicians.       _____________________________________________________________  Should you have questions after your visit to [facility], please contact our office at [phone] between the hours of 8:30 a.m. and 4:30 p.m.   Voicemails left after 4:30 p.m. will not be returned until the following business day.  For prescription refill requests, have your pharmacy contact our office.      Carboplatin injection What is this medicine? CARBOPLATIN (KAR boe pla tin) is a chemotherapy drug. It targets fast dividing cells, like cancer cells, and causes these cells to die. This medicine is used to treat ovarian cancer and many other cancers. This medicine may be used for other purposes; ask your health care provider or pharmacist if you have questions. What should I tell my health care provider before I take this medicine? They need to know if you have any of these conditions: -blood disorders -hearing problems -kidney disease -recent or ongoing radiation therapy -an unusual or allergic reaction to carboplatin, cisplatin, other chemotherapy, other medicines, foods, dyes, or preservatives -pregnant or trying to get pregnant -breast-feeding How should I use this medicine? This drug is usually given as an infusion into a vein. It is administered in a hospital or clinic by a specially trained health care professional. Talk to your pediatrician regarding the use of this medicine in children. Special care may be needed. Overdosage: If you think you have taken too much of this medicine contact a poison control center or emergency room at once. NOTE: This medicine is only for you. Do not share this medicine with others. What if I miss a dose? It is important not to miss a dose. Call your doctor or health care professional if you are  unable to keep an appointment. What may interact with this medicine? -medicines for seizures -medicines to increase blood counts like filgrastim, pegfilgrastim, sargramostim -some antibiotics like amikacin, gentamicin, neomycin, streptomycin, tobramycin -vaccines Talk to your doctor or health care professional before taking any of these  medicines: -acetaminophen -aspirin -ibuprofen -ketoprofen -naproxen This list may not describe all possible interactions. Give your health care provider a list of all the medicines, herbs, non-prescription drugs, or dietary supplements you use. Also tell them if you smoke, drink alcohol, or use illegal drugs. Some items may interact with your medicine. What should I watch for while using this medicine? Your condition will be monitored carefully while you are receiving this medicine. You will need important blood work done while you are taking this medicine. This drug may make you feel generally unwell. This is not uncommon, as chemotherapy can affect healthy cells as well as cancer cells. Report any side effects. Continue your course of treatment even though you feel ill unless your doctor tells you to stop. In some cases, you may be given additional medicines to help with side effects. Follow all directions for their use. Call your doctor or health care professional for advice if you get a fever, chills or sore throat, or other symptoms of a cold or flu. Do not treat yourself. This drug decreases your body's ability to fight infections. Try to avoid being around people who are sick. This medicine may increase your risk to bruise or bleed. Call your doctor or health care professional if you notice any unusual bleeding. Be careful brushing and flossing your teeth or using a toothpick because you may get an infection or bleed more easily. If you have any dental work done, tell your dentist you are receiving this medicine. Avoid taking products that contain aspirin, acetaminophen, ibuprofen, naproxen, or ketoprofen unless instructed by your doctor. These medicines may hide a fever. Do not become pregnant while taking this medicine. Women should inform their doctor if they wish to become pregnant or think they might be pregnant. There is a potential for serious side effects to an unborn child. Talk to  your health care professional or pharmacist for more information. Do not breast-feed an infant while taking this medicine. What side effects may I notice from receiving this medicine? Side effects that you should report to your doctor or health care professional as soon as possible: -allergic reactions like skin rash, itching or hives, swelling of the face, lips, or tongue -signs of infection - fever or chills, cough, sore throat, pain or difficulty passing urine -signs of decreased platelets or bleeding - bruising, pinpoint red spots on the skin, black, tarry stools, nosebleeds -signs of decreased red blood cells - unusually weak or tired, fainting spells, lightheadedness -breathing problems -changes in hearing -changes in vision -chest pain -high blood pressure -low blood counts - This drug may decrease the number of white blood cells, red blood cells and platelets. You may be at increased risk for infections and bleeding. -nausea and vomiting -pain, swelling, redness or irritation at the injection site -pain, tingling, numbness in the hands or feet -problems with balance, talking, walking -trouble passing urine or change in the amount of urine Side effects that usually do not require medical attention (report to your doctor or health care professional if they continue or are bothersome): -hair loss -loss of appetite -metallic taste in the mouth or changes in taste This list may not describe all possible side effects. Call your doctor for medical  advice about side effects. You may report side effects to FDA at 1-800-FDA-1088. Where should I keep my medicine? This drug is given in a hospital or clinic and will not be stored at home. NOTE: This sheet is a summary. It may not cover all possible information. If you have questions about this medicine, talk to your doctor, pharmacist, or health care provider.    2016, Elsevier/Gold Standard. (2007-12-10 14:38:05)     Paclitaxel  injection What is this medicine? PACLITAXEL (PAK li TAX el) is a chemotherapy drug. It targets fast dividing cells, like cancer cells, and causes these cells to die. This medicine is used to treat ovarian cancer, breast cancer, and other cancers. This medicine may be used for other purposes; ask your health care provider or pharmacist if you have questions. What should I tell my health care provider before I take this medicine? They need to know if you have any of these conditions: -blood disorders -irregular heartbeat -infection (especially a virus infection such as chickenpox, cold sores, or herpes) -liver disease -previous or ongoing radiation therapy -an unusual or allergic reaction to paclitaxel, alcohol, polyoxyethylated castor oil, other chemotherapy agents, other medicines, foods, dyes, or preservatives -pregnant or trying to get pregnant -breast-feeding How should I use this medicine? This drug is given as an infusion into a vein. It is administered in a hospital or clinic by a specially trained health care professional. Talk to your pediatrician regarding the use of this medicine in children. Special care may be needed. Overdosage: If you think you have taken too much of this medicine contact a poison control center or emergency room at once. NOTE: This medicine is only for you. Do not share this medicine with others. What if I miss a dose? It is important not to miss your dose. Call your doctor or health care professional if you are unable to keep an appointment. What may interact with this medicine? Do not take this medicine with any of the following medications: -disulfiram -metronidazole This medicine may also interact with the following medications: -cyclosporine -diazepam -ketoconazole -medicines to increase blood counts like filgrastim, pegfilgrastim, sargramostim -other chemotherapy drugs like cisplatin, doxorubicin, epirubicin, etoposide, teniposide,  vincristine -quinidine -testosterone -vaccines -verapamil Talk to your doctor or health care professional before taking any of these medicines: -acetaminophen -aspirin -ibuprofen -ketoprofen -naproxen This list may not describe all possible interactions. Give your health care provider a list of all the medicines, herbs, non-prescription drugs, or dietary supplements you use. Also tell them if you smoke, drink alcohol, or use illegal drugs. Some items may interact with your medicine. What should I watch for while using this medicine? Your condition will be monitored carefully while you are receiving this medicine. You will need important blood work done while you are taking this medicine. This drug may make you feel generally unwell. This is not uncommon, as chemotherapy can affect healthy cells as well as cancer cells. Report any side effects. Continue your course of treatment even though you feel ill unless your doctor tells you to stop. This medicine can cause serious allergic reactions. To reduce your risk you will need to take other medicine(s) before treatment with this medicine. In some cases, you may be given additional medicines to help with side effects. Follow all directions for their use. Call your doctor or health care professional for advice if you get a fever, chills or sore throat, or other symptoms of a cold or flu. Do not treat yourself. This drug decreases your  body's ability to fight infections. Try to avoid being around people who are sick. This medicine may increase your risk to bruise or bleed. Call your doctor or health care professional if you notice any unusual bleeding. Be careful brushing and flossing your teeth or using a toothpick because you may get an infection or bleed more easily. If you have any dental work done, tell your dentist you are receiving this medicine. Avoid taking products that contain aspirin, acetaminophen, ibuprofen, naproxen, or ketoprofen unless  instructed by your doctor. These medicines may hide a fever. Do not become pregnant while taking this medicine. Women should inform their doctor if they wish to become pregnant or think they might be pregnant. There is a potential for serious side effects to an unborn child. Talk to your health care professional or pharmacist for more information. Do not breast-feed an infant while taking this medicine. Men are advised not to father a child while receiving this medicine. This product may contain alcohol. Ask your pharmacist or healthcare provider if this medicine contains alcohol. Be sure to tell all healthcare providers you are taking this medicine. Certain medicines, like metronidazole and disulfiram, can cause an unpleasant reaction when taken with alcohol. The reaction includes flushing, headache, nausea, vomiting, sweating, and increased thirst. The reaction can last from 30 minutes to several hours. What side effects may I notice from receiving this medicine? Side effects that you should report to your doctor or health care professional as soon as possible: -allergic reactions like skin rash, itching or hives, swelling of the face, lips, or tongue -low blood counts - This drug may decrease the number of white blood cells, red blood cells and platelets. You may be at increased risk for infections and bleeding. -signs of infection - fever or chills, cough, sore throat, pain or difficulty passing urine -signs of decreased platelets or bleeding - bruising, pinpoint red spots on the skin, black, tarry stools, nosebleeds -signs of decreased red blood cells - unusually weak or tired, fainting spells, lightheadedness -breathing problems -chest pain -high or low blood pressure -mouth sores -nausea and vomiting -pain, swelling, redness or irritation at the injection site -pain, tingling, numbness in the hands or feet -slow or irregular heartbeat -swelling of the ankle, feet, hands Side effects that  usually do not require medical attention (report to your doctor or health care professional if they continue or are bothersome): -bone pain -complete hair loss including hair on your head, underarms, pubic hair, eyebrows, and eyelashes -changes in the color of fingernails -diarrhea -loosening of the fingernails -loss of appetite -muscle or joint pain -red flush to skin -sweating This list may not describe all possible side effects. Call your doctor for medical advice about side effects. You may report side effects to FDA at 1-800-FDA-1088. Where should I keep my medicine? This drug is given in a hospital or clinic and will not be stored at home. NOTE: This sheet is a summary. It may not cover all possible information. If you have questions about this medicine, talk to your doctor, pharmacist, or health care provider.    2016, Elsevier/Gold Standard. (2015-04-22 13:02:56)

## 2015-11-17 NOTE — Progress Notes (Unsigned)
Chemo teaching done and consent signed for Carboplatin & Taxol. Zofran @ pharmacy ready for pick up and is $1.33. Calendar given to patient for XRT and chemo. Copies of calendar and all other papers given to daughter. Thermometer given to patient. Patient given 2 chemo alert cards. My name was written with # on patient's blue chemo folder and I also put the clinic's # and Jennifer's name on the front of the folder as a resource person within the cancer center.

## 2015-11-18 DIAGNOSIS — Z510 Encounter for antineoplastic radiation therapy: Secondary | ICD-10-CM | POA: Diagnosis not present

## 2015-11-18 DIAGNOSIS — C342 Malignant neoplasm of middle lobe, bronchus or lung: Secondary | ICD-10-CM | POA: Diagnosis not present

## 2015-11-19 ENCOUNTER — Ambulatory Visit
Admission: RE | Admit: 2015-11-19 | Discharge: 2015-11-19 | Disposition: A | Discharge status: Home / Self Care | Payer: PPO | Source: Ambulatory Visit | Attending: Radiation Oncology | Admitting: Radiation Oncology

## 2015-11-19 ENCOUNTER — Ambulatory Visit: Payer: PPO | Admitting: Radiation Oncology

## 2015-11-22 ENCOUNTER — Encounter: Payer: Self-pay | Admitting: Radiation Oncology

## 2015-11-22 ENCOUNTER — Ambulatory Visit
Admission: RE | Admit: 2015-11-22 | Discharge: 2015-11-22 | Disposition: A | Discharge status: Home / Self Care | Payer: PPO | Source: Ambulatory Visit | Attending: Radiation Oncology | Admitting: Radiation Oncology

## 2015-11-22 ENCOUNTER — Encounter (HOSPITAL_BASED_OUTPATIENT_CLINIC_OR_DEPARTMENT_OTHER): Payer: PPO

## 2015-11-22 VITALS — BP 121/57 | HR 78 | Temp 97.6°F | Resp 18 | Ht 63.0 in | Wt 166.1 lb

## 2015-11-22 VITALS — BP 147/62 | HR 63 | Temp 97.9°F | Resp 16 | Wt 165.0 lb

## 2015-11-22 DIAGNOSIS — Z5111 Encounter for antineoplastic chemotherapy: Secondary | ICD-10-CM | POA: Diagnosis not present

## 2015-11-22 DIAGNOSIS — C342 Malignant neoplasm of middle lobe, bronchus or lung: Secondary | ICD-10-CM | POA: Diagnosis not present

## 2015-11-22 DIAGNOSIS — Z510 Encounter for antineoplastic radiation therapy: Secondary | ICD-10-CM | POA: Diagnosis not present

## 2015-11-22 MED ORDER — SODIUM CHLORIDE 0.9 % IV SOLN
Freq: Once | INTRAVENOUS | Status: AC
  Administered 2015-11-22: 09:00:00 via INTRAVENOUS

## 2015-11-22 MED ORDER — DIPHENHYDRAMINE HCL 50 MG/ML IJ SOLN
50.0000 mg | Freq: Once | INTRAMUSCULAR | Status: AC
  Administered 2015-11-22: 50 mg via INTRAVENOUS

## 2015-11-22 MED ORDER — PALONOSETRON HCL INJECTION 0.25 MG/5ML
0.2500 mg | Freq: Once | INTRAVENOUS | Status: AC
  Administered 2015-11-22: 0.25 mg via INTRAVENOUS

## 2015-11-22 MED ORDER — FAMOTIDINE IN NACL 20-0.9 MG/50ML-% IV SOLN
INTRAVENOUS | Status: AC
  Filled 2015-11-22: qty 50

## 2015-11-22 MED ORDER — FAMOTIDINE IN NACL 20-0.9 MG/50ML-% IV SOLN
20.0000 mg | Freq: Once | INTRAVENOUS | Status: AC
  Administered 2015-11-22: 20 mg via INTRAVENOUS

## 2015-11-22 MED ORDER — PACLITAXEL CHEMO INJECTION 300 MG/50ML
45.0000 mg/m2 | Freq: Once | INTRAVENOUS | Status: AC
  Administered 2015-11-22: 84 mg via INTRAVENOUS
  Filled 2015-11-22: qty 14

## 2015-11-22 MED ORDER — DIPHENHYDRAMINE HCL 50 MG/ML IJ SOLN
INTRAMUSCULAR | Status: AC
  Filled 2015-11-22: qty 1

## 2015-11-22 MED ORDER — SONAFINE EX EMUL
1.0000 "application " | Freq: Two times a day (BID) | CUTANEOUS | Status: DC
  Administered 2015-11-22: 1 via TOPICAL
  Filled 2015-11-22: qty 45

## 2015-11-22 MED ORDER — HEPARIN SOD (PORK) LOCK FLUSH 100 UNIT/ML IV SOLN
500.0000 [IU] | Freq: Once | INTRAVENOUS | Status: AC | PRN
Start: 2015-11-22 — End: 2015-11-22
  Administered 2015-11-22: 500 [IU]
  Filled 2015-11-22: qty 5

## 2015-11-22 MED ORDER — SODIUM CHLORIDE 0.9 % IV SOLN
20.0000 mg | Freq: Once | INTRAVENOUS | Status: AC
  Administered 2015-11-22: 20 mg via INTRAVENOUS
  Filled 2015-11-22: qty 2

## 2015-11-22 MED ORDER — PALONOSETRON HCL INJECTION 0.25 MG/5ML
INTRAVENOUS | Status: AC
  Filled 2015-11-22: qty 5

## 2015-11-22 MED ORDER — SODIUM CHLORIDE 0.9% FLUSH
10.0000 mL | INTRAVENOUS | Status: DC | PRN
  Administered 2015-11-22: 10 mL
  Filled 2015-11-22: qty 10

## 2015-11-22 MED ORDER — SODIUM CHLORIDE 0.9 % IV SOLN
164.2000 mg | Freq: Once | INTRAVENOUS | Status: AC
  Administered 2015-11-22: 160 mg via INTRAVENOUS
  Filled 2015-11-22: qty 16

## 2015-11-22 NOTE — Progress Notes (Signed)
Tolerated chemo well. Stable on discharge home with daughter via wheelchair.

## 2015-11-22 NOTE — Progress Notes (Signed)
Mrs. Vath has received 1 fraction to her right lung.  Denies pain today.  Skin to right chest normal color.  Appetite is good.  Energy level has decreased usually non stop doing things at home.  Had her first chemo infusion today. No SOB O2 sat 97%,no swallowing difficulty or pain when swallowing.  Education done today,Sonafine given with instructions to use bid.  To see the nutritionist tomorrow. Wt Readings from Last 3 Encounters:  11/22/15 166 lb 1.6 oz (75.342 kg)  11/22/15 165 lb (74.844 kg)  11/17/15 165 lb (74.844 kg)  BP 121/57 mmHg  Pulse 78  Temp(Src) 97.6 F (36.4 C) (Oral)  Resp 18  Ht '5\' 3"'$  (1.6 m)  Wt 166 lb 1.6 oz (75.342 kg)  BMI 29.43 kg/m2  SpO2 97%

## 2015-11-22 NOTE — Progress Notes (Signed)
   Weekly Management Note:  outpatient    ICD-9-CM ICD-10-CM   1. 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  CBCT/MVCT images/Port film x-rays were reviewed.  The chart was checked. Started chemotherapy and RT today for lung cancer.  No new complaints  Physical Findings:  Wt Readings from Last 3 Encounters:  11/22/15 166 lb 1.6 oz (75.342 kg)  11/22/15 165 lb (74.844 kg)  11/17/15 165 lb (74.844 kg)    height is '5\' 3"'$  (1.6 m) and weight is 166 lb 1.6 oz (75.342 kg). Her oral temperature is 97.6 F (36.4 C). Her blood pressure is 121/57 and her pulse is 78. Her respiration is 18 and oxygen saturation is 97%.  NAD, ambulatory  CBC    Component Value Date/Time   WBC 6.0 11/15/2015 1233   RBC 4.54 11/15/2015 1233   HGB 12.8 11/15/2015 1233   HCT 40.3 11/15/2015 1233   PLT 171 11/15/2015 1233   MCV 88.8 11/15/2015 1233   MCH 28.2 11/15/2015 1233   MCHC 31.8 11/15/2015 1233   RDW 13.6 11/15/2015 1233   LYMPHSABS 1.3 11/15/2015 1233   MONOABS 0.5 11/15/2015 1233   EOSABS 0.2 11/15/2015 1233   BASOSABS 0.1 11/15/2015 1233     CMP     Component Value Date/Time   NA 138 11/15/2015 1233   K 3.5 11/15/2015 1233   CL 104 11/15/2015 1233   CO2 25 11/15/2015 1233   GLUCOSE 96 11/15/2015 1233   BUN 19 11/15/2015 1233   CREATININE 0.64 11/15/2015 1233   CREATININE 0.69 12/16/2014 1058   CALCIUM 9.8 11/15/2015 1233   PROT 7.0 01/19/2015 0817   ALBUMIN 4.0 01/19/2015 0817   AST 22 01/19/2015 0817   ALT 18 01/19/2015 0817   ALKPHOS 60 01/19/2015 0817   BILITOT 1.1 01/19/2015 0817   GFRNONAA >60 11/15/2015 1233   GFRAA >60 11/15/2015 1233     Impression:  The patient is tolerating radiotherapy.   Plan:  Continue radiotherapy as planned.  -----------------------------------  Eppie Gibson, MD

## 2015-11-22 NOTE — Patient Instructions (Signed)
[facility] Discharge Instructions for Patients Receiving Chemotherapy  Today you received the following chemotherapy agents Taxol and Carbo Cycle 1 Day 1.  To help prevent nausea and vomiting after your treatment, we encourage you to take your nausea medication as instructed. If you develop nausea and vomiting that is not controlled by your nausea medication, call the clinic. If it is after clinic hours your family physician or the after hours number for the clinic or go to the Emergency Department. BELOW ARE SYMPTOMS THAT SHOULD BE REPORTED IMMEDIATELY:  *FEVER GREATER THAN 101.0 F  *CHILLS WITH OR WITHOUT FEVER  NAUSEA AND VOMITING THAT IS NOT CONTROLLED WITH YOUR NAUSEA MEDICATION  *UNUSUAL SHORTNESS OF BREATH  *UNUSUAL BRUISING OR BLEEDING  TENDERNESS IN MOUTH AND THROAT WITH OR WITHOUT PRESENCE OF ULCERS  *URINARY PROBLEMS  *BOWEL PROBLEMS  UNUSUAL RASH Items with * indicate a potential emergency and should be followed up as soon as possible.  One of the nurses will contact you 24 hours after your treatment. Please let the nurse know about any problems that you may have experienced. Feel free to call the clinic you have any questions or concerns. The clinic phone number is [phone].   I have been informed and understand all the instructions given to me. I know to contact the clinic, my physician, or go to the Emergency Department if any problems should occur. I do not have any questions at this time, but understand that I may call the clinic during office hours or the Patient Navigator at [phone] should I have any questions or need assistance in obtaining follow up care.    __________________________________________  _____________  __________ Signature of Patient or Authorized Representative            Date                   Time    __________________________________________ Nurse's Signature

## 2015-11-23 ENCOUNTER — Telehealth (HOSPITAL_COMMUNITY): Payer: Self-pay | Admitting: *Deleted

## 2015-11-23 ENCOUNTER — Ambulatory Visit (HOSPITAL_COMMUNITY)
Admission: RE | Admit: 2015-11-23 | Discharge: 2015-11-23 | Disposition: A | Discharge status: Home / Self Care | Payer: PPO | Source: Ambulatory Visit | Attending: Radiation Oncology | Admitting: Radiation Oncology

## 2015-11-23 ENCOUNTER — Ambulatory Visit
Admission: RE | Admit: 2015-11-23 | Discharge: 2015-11-23 | Disposition: A | Discharge status: Home / Self Care | Payer: PPO | Source: Ambulatory Visit | Attending: Radiation Oncology | Admitting: Radiation Oncology

## 2015-11-23 ENCOUNTER — Ambulatory Visit: Payer: PPO | Admitting: Nutrition

## 2015-11-23 ENCOUNTER — Encounter: Payer: Self-pay | Admitting: *Deleted

## 2015-11-23 DIAGNOSIS — C342 Malignant neoplasm of middle lobe, bronchus or lung: Secondary | ICD-10-CM | POA: Diagnosis not present

## 2015-11-23 DIAGNOSIS — Z510 Encounter for antineoplastic radiation therapy: Secondary | ICD-10-CM | POA: Diagnosis not present

## 2015-11-23 DIAGNOSIS — C3490 Malignant neoplasm of unspecified part of unspecified bronchus or lung: Secondary | ICD-10-CM | POA: Diagnosis not present

## 2015-11-23 MED ORDER — GADOBENATE DIMEGLUMINE 529 MG/ML IV SOLN
15.0000 mL | Freq: Once | INTRAVENOUS | Status: AC | PRN
  Administered 2015-11-23: 15 mL via INTRAVENOUS

## 2015-11-23 NOTE — Telephone Encounter (Signed)
Spoke with patient. Reports she is doing well. Denies any complaints post chemo yesterday.

## 2015-11-23 NOTE — Progress Notes (Signed)
[place] Clinical Social Work  Clinical Social Work was referred by Pension scheme manager for assessment of psychosocial needs due to cancer progression. Clinical Social Worker attempted to contact pt at home. CSW left supportive message introducing self, explaining role of CSW and how to locate at both [facility] and [facility]. CSW reviewed chart and did not see current, specific needs, but CSW available as needed and left message stating such.       Clinical Social Work interventions: Resource education   Loren Racer, [place] Tuesdays   [phone]

## 2015-11-23 NOTE — Progress Notes (Signed)
75 year old female diagnosed with lung cancer receiving concurrent chemoradiation therapy.  She is a patient of Dr. Whitney Muse and Dr. Pablo Ledger.  Past medical history includes hypertension, GERD, breast cancer and diarrhea.  Medications include Prilosec, Zofran, and Compazine.  Labs were reviewed.  Height: 63 inches. Weight: 166 pounds on March 6. Usual body weight: 165-170 pounds. BMI: 29.43.  Patient had first chemotherapy and radiation treatment yesterday. Currently patient has no nutrition impact symptoms. Patient denies difficulty chewing and swallowing and consumes a regular diet. She has recently purchased premier protein drinks in anticipation of decreased intake.  Nutrition diagnosis:  Food and nutrition related knowledge deficit related to new diagnosis of lung cancer and associated treatments as evidenced by no prior need for nutrition related information.  Intervention:  Patient was educated to consume small frequent meals and snacks with high protein foods and adequate calories to maintain current weight. Reviewed soft protein foods and provided a fact sheet on increasing calories and protein, making the most of each bite and soft, moist protein foods. Brief education provided on strategies for nausea, vomiting, and provided fact sheet. Reviewed strategies for difficulty swallowing.  I provided fact sheet. Explained the difference between various oral nutrition supplements and provided patient with samples and coupons. Recommended patient begin premier protein once daily. Questions were answered.  Teach back method used.  Contact information was provided.  Monitoring, evaluation, goals: Patient will tolerate adequate calories and protein to support weight maintenance.  Next visit: Scheduled as needed.  **Disclaimer: This note was dictated with voice recognition software. Similar sounding words can inadvertently be transcribed and this note may contain transcription errors  which may not have been corrected upon publication of note.**

## 2015-11-24 ENCOUNTER — Ambulatory Visit
Admission: RE | Admit: 2015-11-24 | Discharge: 2015-11-24 | Disposition: A | Discharge status: Home / Self Care | Payer: PPO | Source: Ambulatory Visit | Attending: Radiation Oncology | Admitting: Radiation Oncology

## 2015-11-24 DIAGNOSIS — C342 Malignant neoplasm of middle lobe, bronchus or lung: Secondary | ICD-10-CM | POA: Diagnosis not present

## 2015-11-24 DIAGNOSIS — Z510 Encounter for antineoplastic radiation therapy: Secondary | ICD-10-CM | POA: Diagnosis not present

## 2015-11-25 ENCOUNTER — Ambulatory Visit
Admission: RE | Admit: 2015-11-25 | Discharge: 2015-11-25 | Disposition: A | Discharge status: Home / Self Care | Payer: PPO | Source: Ambulatory Visit | Attending: Radiation Oncology | Admitting: Radiation Oncology

## 2015-11-25 DIAGNOSIS — Z510 Encounter for antineoplastic radiation therapy: Secondary | ICD-10-CM | POA: Diagnosis not present

## 2015-11-25 DIAGNOSIS — C342 Malignant neoplasm of middle lobe, bronchus or lung: Secondary | ICD-10-CM | POA: Diagnosis not present

## 2015-11-26 ENCOUNTER — Ambulatory Visit
Admission: RE | Admit: 2015-11-26 | Discharge: 2015-11-26 | Disposition: A | Discharge status: Home / Self Care | Payer: PPO | Source: Ambulatory Visit | Attending: Radiation Oncology | Admitting: Radiation Oncology

## 2015-11-26 ENCOUNTER — Telehealth (HOSPITAL_COMMUNITY): Payer: Self-pay | Admitting: *Deleted

## 2015-11-26 DIAGNOSIS — Z510 Encounter for antineoplastic radiation therapy: Secondary | ICD-10-CM | POA: Diagnosis not present

## 2015-11-26 DIAGNOSIS — C342 Malignant neoplasm of middle lobe, bronchus or lung: Secondary | ICD-10-CM | POA: Diagnosis not present

## 2015-11-26 NOTE — Telephone Encounter (Signed)
Call made to check on patient prior to the weekend. Patient is doing ok. Appetite good. No nausea.

## 2015-11-29 ENCOUNTER — Encounter (HOSPITAL_BASED_OUTPATIENT_CLINIC_OR_DEPARTMENT_OTHER): Payer: PPO

## 2015-11-29 ENCOUNTER — Encounter (HOSPITAL_COMMUNITY): Payer: Self-pay | Admitting: Hematology & Oncology

## 2015-11-29 ENCOUNTER — Encounter (HOSPITAL_BASED_OUTPATIENT_CLINIC_OR_DEPARTMENT_OTHER): Payer: PPO | Admitting: Hematology & Oncology

## 2015-11-29 ENCOUNTER — Ambulatory Visit
Admission: RE | Admit: 2015-11-29 | Discharge: 2015-11-29 | Disposition: A | Discharge status: Home / Self Care | Payer: PPO | Source: Ambulatory Visit | Attending: Radiation Oncology | Admitting: Radiation Oncology

## 2015-11-29 VITALS — BP 128/64 | HR 68 | Temp 97.7°F | Resp 16 | Wt 167.2 lb

## 2015-11-29 DIAGNOSIS — R590 Localized enlarged lymph nodes: Secondary | ICD-10-CM

## 2015-11-29 DIAGNOSIS — E7800 Pure hypercholesterolemia, unspecified: Secondary | ICD-10-CM | POA: Diagnosis not present

## 2015-11-29 DIAGNOSIS — Z9221 Personal history of antineoplastic chemotherapy: Secondary | ICD-10-CM | POA: Diagnosis not present

## 2015-11-29 DIAGNOSIS — Z5111 Encounter for antineoplastic chemotherapy: Secondary | ICD-10-CM | POA: Diagnosis not present

## 2015-11-29 DIAGNOSIS — R911 Solitary pulmonary nodule: Secondary | ICD-10-CM | POA: Diagnosis not present

## 2015-11-29 DIAGNOSIS — Z510 Encounter for antineoplastic radiation therapy: Secondary | ICD-10-CM | POA: Diagnosis not present

## 2015-11-29 DIAGNOSIS — C342 Malignant neoplasm of middle lobe, bronchus or lung: Secondary | ICD-10-CM

## 2015-11-29 DIAGNOSIS — Z923 Personal history of irradiation: Secondary | ICD-10-CM | POA: Diagnosis not present

## 2015-11-29 DIAGNOSIS — Z90710 Acquired absence of both cervix and uterus: Secondary | ICD-10-CM | POA: Diagnosis not present

## 2015-11-29 DIAGNOSIS — IMO0001 Reserved for inherently not codable concepts without codable children: Secondary | ICD-10-CM

## 2015-11-29 DIAGNOSIS — Z7982 Long term (current) use of aspirin: Secondary | ICD-10-CM | POA: Diagnosis not present

## 2015-11-29 DIAGNOSIS — Z9889 Other specified postprocedural states: Secondary | ICD-10-CM | POA: Diagnosis not present

## 2015-11-29 DIAGNOSIS — C50911 Malignant neoplasm of unspecified site of right female breast: Secondary | ICD-10-CM

## 2015-11-29 DIAGNOSIS — Z853 Personal history of malignant neoplasm of breast: Secondary | ICD-10-CM | POA: Diagnosis not present

## 2015-11-29 DIAGNOSIS — I10 Essential (primary) hypertension: Secondary | ICD-10-CM | POA: Diagnosis not present

## 2015-11-29 DIAGNOSIS — K219 Gastro-esophageal reflux disease without esophagitis: Secondary | ICD-10-CM | POA: Diagnosis not present

## 2015-11-29 DIAGNOSIS — Z79899 Other long term (current) drug therapy: Secondary | ICD-10-CM | POA: Diagnosis not present

## 2015-11-29 LAB — COMPREHENSIVE METABOLIC PANEL
ALBUMIN: 3.6 g/dL (ref 3.5–5.0)
ALK PHOS: 65 U/L (ref 38–126)
ALT: 20 U/L (ref 14–54)
AST: 25 U/L (ref 15–41)
Anion gap: 5 (ref 5–15)
BILIRUBIN TOTAL: 0.6 mg/dL (ref 0.3–1.2)
BUN: 18 mg/dL (ref 6–20)
CALCIUM: 10.2 mg/dL (ref 8.9–10.3)
CO2: 28 mmol/L (ref 22–32)
CREATININE: 0.67 mg/dL (ref 0.44–1.00)
Chloride: 104 mmol/L (ref 101–111)
GFR calc Af Amer: >60 mL/min (ref >60)
GLUCOSE: 131 mg/dL — AB (ref 65–99)
Potassium: 3.5 mmol/L (ref 3.5–5.1)
Sodium: 137 mmol/L (ref 135–145)
TOTAL PROTEIN: 6.4 g/dL — AB (ref 6.5–8.1)

## 2015-11-29 LAB — CBC WITH DIFFERENTIAL/PLATELET
BASOS ABS: 0 10*3/uL (ref 0.0–0.1)
Basophils Relative: 1 %
EOS ABS: 0.2 10*3/uL (ref 0.0–0.7)
EOS PCT: 3 %
HCT: 39 % (ref 36.0–46.0)
Hemoglobin: 12.6 g/dL (ref 12.0–15.0)
LYMPHS PCT: 20 %
Lymphs Abs: 0.9 10*3/uL (ref 0.7–4.0)
MCH: 28.6 pg (ref 26.0–34.0)
MCHC: 32.3 g/dL (ref 30.0–36.0)
MCV: 88.4 fL (ref 78.0–100.0)
MONO ABS: 0.2 10*3/uL (ref 0.1–1.0)
Monocytes Relative: 4 %
Neutro Abs: 3.4 10*3/uL (ref 1.7–7.7)
Neutrophils Relative %: 72 %
PLATELETS: 156 10*3/uL (ref 150–400)
RBC: 4.41 MIL/uL (ref 3.87–5.11)
RDW: 13.1 % (ref 11.5–15.5)
WBC: 4.7 10*3/uL (ref 4.0–10.5)

## 2015-11-29 MED ORDER — HEPARIN SOD (PORK) LOCK FLUSH 100 UNIT/ML IV SOLN
INTRAVENOUS | Status: AC
  Filled 2015-11-29: qty 5

## 2015-11-29 MED ORDER — FAMOTIDINE IN NACL 20-0.9 MG/50ML-% IV SOLN
20.0000 mg | Freq: Once | INTRAVENOUS | Status: AC
  Administered 2015-11-29: 20 mg via INTRAVENOUS
  Filled 2015-11-29: qty 50

## 2015-11-29 MED ORDER — SODIUM CHLORIDE 0.9% FLUSH
10.0000 mL | INTRAVENOUS | Status: DC | PRN
  Administered 2015-11-29: 10 mL
  Filled 2015-11-29: qty 10

## 2015-11-29 MED ORDER — SODIUM CHLORIDE 0.9 % IV SOLN
164.2000 mg | Freq: Once | INTRAVENOUS | Status: AC
  Administered 2015-11-29: 160 mg via INTRAVENOUS
  Filled 2015-11-29: qty 16

## 2015-11-29 MED ORDER — PALONOSETRON HCL INJECTION 0.25 MG/5ML
0.2500 mg | Freq: Once | INTRAVENOUS | Status: AC
  Administered 2015-11-29: 0.25 mg via INTRAVENOUS
  Filled 2015-11-29: qty 5

## 2015-11-29 MED ORDER — SODIUM CHLORIDE 0.9 % IV SOLN
20.0000 mg | Freq: Once | INTRAVENOUS | Status: AC
  Administered 2015-11-29: 20 mg via INTRAVENOUS
  Filled 2015-11-29: qty 2

## 2015-11-29 MED ORDER — PACLITAXEL CHEMO INJECTION 300 MG/50ML
45.0000 mg/m2 | Freq: Once | INTRAVENOUS | Status: AC
  Administered 2015-11-29: 84 mg via INTRAVENOUS
  Filled 2015-11-29: qty 14

## 2015-11-29 MED ORDER — SODIUM CHLORIDE 0.9 % IV SOLN
Freq: Once | INTRAVENOUS | Status: AC
  Administered 2015-11-29: 09:00:00 via INTRAVENOUS

## 2015-11-29 MED ORDER — HEPARIN SOD (PORK) LOCK FLUSH 100 UNIT/ML IV SOLN
500.0000 [IU] | Freq: Once | INTRAVENOUS | Status: AC | PRN
  Administered 2015-11-29: 500 [IU]

## 2015-11-29 MED ORDER — DIPHENHYDRAMINE HCL 50 MG/ML IJ SOLN
50.0000 mg | Freq: Once | INTRAMUSCULAR | Status: AC
  Administered 2015-11-29: 50 mg via INTRAVENOUS
  Filled 2015-11-29: qty 1

## 2015-11-29 NOTE — Progress Notes (Signed)
Tolerated chemo well. Ambulatory on discharge home with daughter.

## 2015-11-29 NOTE — Progress Notes (Signed)
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 ER +90%, PR 30% Her -2 neu indeterminate. Surgery, XRT, hormonal therapy. Intolerant of Tamoxifen, arimidex X 5 years completed 02/15/2005 CT abdomen 12/18/2014 6 mm RML pulmonary nodule, 17 mm nodule in LL Base was 64m previously PET/CT 7 mm RML nodule hypermetabolic, hypermetabolic activity along the posterior inferior margin of the R mainstem bronchus, non-enlarged subcarinal LN EBUS 01/19/2015 with Dr. VPrescott Bullock Lung, core biopsy RML on 07/15/2015 biopsy positive for poorly differentiated carcinoma, staining pattern favors poorly differentiated squamous cell carcinoma.  Kari Bullock needle FNA 7 Node #2, no malignant cells PET imaging with R infrahilar and subcarinal adenopathy   CURRENT THERAPY:  To begin concurrent chemo/XRT  INTERVAL HISTORY: Kari RAINONE75y.o. female returns today for additional f/u of a locally advanced NSCLC.  [facility] the right breast, grade 2 (T1 A., N1 a) with 2 of 4 positive lymph nodes, ER +90%, PR +30%, lymph node metastases were 1 mm or slightly less. HER-2/neu was indeterminant at 2+. She was diagnosed in February 2001 treated with surgery followed by radiation therapy and then hormonal therapy. She could not tolerate tamoxifen so was switched to Arimidex which she took for total 5 years ending on 02/15/2005, no evidence of disease.  She underwent XRT with Dr. WPablo Bullock 09/08/15-09/15/15: for a squamous cell carcinoma of the Right middle lobe/ 54 Gy at 18 GY per fraction x 3 fractions. Unfortunately repeat imaging has shown disease within the mediastinal and infrahilar regions. She has opted for concurrent therapy. She is here today for ongoing chemotherapy.  [name]  here with her eldest daughter today. She was receiving Cycle #2 of Carboplatin/Paclitaxel chemotherapy today in a treatment chair.  She said that she has been eating okay and sometimes too much. She tries to drink at least two cans boost a day. Says she has gained weight. Has gained one pound since her last visit. She does not eat anything spicy or hot due to her acid reflux. She only eats bland food and does not taste much.   She states that she has been doing fine. She says that she is doing 95% better now and that only every once in a while she will have a little pain.   She was given a Benadryl this morning and she "felt like the top of my head was coming off"  Radiation has been going fine and that she has had no pain or problems due to it.   She takes Prilosec in the morning and night every day.   Denies diarrhea, constipation and nausea.  Past Medical History  Diagnosis Date  . Hypertension   . Breast cancer ([place]     2001 RT breat lumpectomy rad tx  . GERD (gastroesophageal reflux disease)   . Memory loss   . Pure hypercholesterolemia   . Memory change 10/24/2013  . Infiltrating ductal carcinoma of right breast ([place] 01/09/2015  . Pulmonary nodule 01/09/2015  . Headache     years ago, none since hysterectomy  . Anemia     in the past  . Diarrhea   . Complication of anesthesia     confusion  . Lung cancer ([facility]     has Memory  change; Periumbilical pain; Diarrhea; GERD (gastroesophageal reflux disease); Infiltrating ductal carcinoma of right breast ([place]); Pulmonary nodule; Diverticulosis of colon without hemorrhage; Chronic diarrhea; Mediastinal adenopathy; Mucosal abnormality of stomach; and Primary cancer of right middle lobe of lung ([place]) on her problem list.     is allergic to codeine; morphine and related; adhesive; sulfur; and tamoxifen.  Kari Bullock does not currently have medications on file.  Past Surgical History  Procedure Laterality Date  . Abdominal  hysterectomy    . Bilateral oophorectomy  2007  . Incision and drainage of wound  2011    buttocks  . Tonsillectomy    . Cataract extraction Bilateral     lens implant  . Carpal tunnel release Right   . Colonoscopy  2011    RMR: 1. Anal papilla, otherwise normal rectum. 2. Left-sided diverticula, single diminutive polyp in the sigmoid segment status post cold biospy removal. Remainder of the colonic mucosa appeared normal.   . Colonoscopy N/A 01/14/2015    Procedure: COLONOSCOPY;  Surgeon: Kari Dolin, MD;  [facility];  Service: Endoscopy;  Laterality: N/A;  930 - moved to 10:15  . Esophagogastroduodenoscopy N/A 01/14/2015    RMR: Colonic diverticulosis. status post segmental biospy and stool sampling.  No egd done today  . Breast surgery Right     right lumpectomy, lymph glands remove  . Video bronchoscopy with endobronchial ultrasound N/A 01/19/2015    Procedure: VIDEO BRONCHOSCOPY WITH ENDOBRONCHIAL ULTRASOUND;  Surgeon: Ivin Poot, MD;  Location: [facility] OR;  Service: Thoracic;  Laterality: N/A;  . Esophagogastroduodenoscopy N/A 02/11/2015    RMR: Focally abnormal antrum of doubtful clinical significance- Status post biopsy.   . Video bronchoscopy with endobronchial ultrasound N/A 07/27/2015    Procedure: VIDEO BRONCHOSCOPY WITH ENDOBRONCHIAL ULTRASOUND;  Surgeon: Ivin Poot, MD;  Location: [facility] OR;  Service: Thoracic;  Laterality: N/A;    Denies any headaches, dizziness, double vision, fevers, chills, night sweats, nausea, vomiting, diarrhea, constipation, chest pain, heart palpitations, shortness of breath, blood in stool, black tarry stool, urinary pain, urinary burning, urinary frequency, hematuria, appetite loss.  14 point review of systems was performed and is negative except as detailed under history of present illness and above    PHYSICAL EXAMINATION  ECOG PERFORMANCE STATUS: 1 - Symptomatic but completely ambulatory Vitals with BMI 11/29/2015  Height     Weight 167 lbs 3 oz  BMI   Systolic 024  Diastolic 60  Pulse 73  Respirations 18   There were no vitals filed for this visit.  GENERAL:alert, no distress, well nourished, well developed, comfortable, cooperative, obese, smiling and accompanied by her eldest daughter and husband. SKIN: skin color, texture, turgor are normal, no rashes or significant lesions. HEAD: Normocephalic, No masses, lesions, tenderness or abnormalities EYES: normal, PERRLA, EOMI, Conjunctiva are pink and non-injected EARS: External ears normal OROPHARYNX:lips, buccal mucosa, and tongue normal and mucous membranes are moist  NECK: supple, no adenopathy, thyroid normal size, non-tender, without nodularity, trachea midline LYMPH:  no palpable lymphadenopathy BREAST:not examined LUNGS: clear to auscultation  HEART: regular rate & rhythm, no murmurs, no gallops, S1 normal and S2 normal ABDOMEN:abdomen soft, non-tender, obese and normal bowel sounds BACK: Back symmetric, no curvature., No CVA tenderness EXTREMITIES:less then 2 second capillary refill, no joint deformities, effusion, or inflammation, no skin discoloration, no clubbing, no cyanosis  NEURO: alert & oriented x 3 with fluent speech, no focal motor/sensory deficits, gait normal  LABORATORY DATA: I have reviewed the data as  listed. CBC    Component Value Date/Time   WBC 4.7 11/29/2015 0920   RBC 4.41 11/29/2015 0920   HGB 12.6 11/29/2015 0920   HCT 39.0 11/29/2015 0920   PLT 156 11/29/2015 0920   MCV 88.4 11/29/2015 0920   MCH 28.6 11/29/2015 0920   MCHC 32.3 11/29/2015 0920   RDW 13.1 11/29/2015 0920   LYMPHSABS 0.9 11/29/2015 0920   MONOABS 0.2 11/29/2015 0920   EOSABS 0.2 11/29/2015 0920   BASOSABS 0.0 11/29/2015 0920      Chemistry      Component Value Date/Time   NA 137 11/29/2015 0920   K 3.5 11/29/2015 0920   CL 104 11/29/2015 0920   CO2 28 11/29/2015 0920   BUN 18 11/29/2015 0920   CREATININE 0.67 11/29/2015 0920   CREATININE  0.69 12/16/2014 1058      Component Value Date/Time   CALCIUM 10.2 11/29/2015 0920   ALKPHOS 65 11/29/2015 0920   AST 25 11/29/2015 0920   ALT 20 11/29/2015 0920   BILITOT 0.6 11/29/2015 0920      RADIOGRAPHY: I have personally reviewed the radiological images as listed and agreed with the findings in the report. Study Result     CLINICAL DATA: 75 year old female with lung cancer, progressive mediastinal lymphadenopathy. Restaging. Subsequent encounter.  EXAM: MRI HEAD WITHOUT AND WITH CONTRAST  TECHNIQUE: Multiplanar, multiecho pulse sequences of the brain and surrounding structures were obtained without and with intravenous contrast.  CONTRAST: 62m MULTIHANCE GADOBENATE DIMEGLUMINE 529 MG/ML IV SOLN  COMPARISON: Brain MRI 03/19/2015  FINDINGS: No midline shift, mass effect, or evidence of intracranial mass lesion. No abnormal enhancement identified. No dural thickening identified.  Major intracranial vascular flow voids are stable. No restricted diffusion to suggest acute infarction. No ventriculomegaly, extra-axial collection or acute intracranial hemorrhage. Cervicomedullary junction and pituitary are within normal limits.  Negative visualized cervical spine and spinal cord. Stable visualized bone marrow signal, no destructive osseous lesion identified.  Stable gray and white matter signal throughout the brain. Mild to moderate nonspecific cerebral white matter T2 and FLAIR hyperintensity is stable. No chronic cerebral blood products or cortical encephalomalacia.  Visible internal auditory structures appear normal. Incidental high riding left internal jugular bulb. Mastoids are clear. Interval resolved paranasal sinus mucosal thickening. Stable postoperative appearance of both globes. Otherwise negative orbit and scalp soft tissues.  IMPRESSION: 1. No acute or metastatic intracranial abnormality. 2. Stable MRI appearance of the brain since  2016.   Electronically Signed  By: HGenevie AnnM.D.  On: 11/23/2015 13:08    Study Result     CLINICAL DATA: Subsequent treatment strategy for right lung cancer (right middle lobe).  EXAM: NUCLEAR MEDICINE PET SKULL BASE TO THIGH  TECHNIQUE: 8.1 mCi F-18 FDG was injected intravenously. Full-ring PET imaging was performed from the skull base to thigh after the radiotracer. CT data was obtained and used for attenuation correction and anatomic localization.  FASTING BLOOD GLUCOSE: Value: 103 mg/dl  COMPARISON: 07/15/2015  FINDINGS: NECK  No hypermetabolic lymph nodes in the neck.  CHEST  Subcarinal node 2 cm diameter, maximum standard uptake value the 30.5. This is partially continuous with a right infrahilar node measuring 1.2 cm in diameter with maximum standard uptake value 27.5.  In the space previously occupied by the right middle lobe nodule there is only some indistinct architectural distortion/scar-like appearance, without significant residual hypermetabolic activity at the site of the nodule. No significant abnormal hypermetabolic activity associated with the 7 mm lingular nodule currently observed. The  density along the lower lingula is probably atelectasis adjacent to epicardial adipose tissue and not hypermetabolic.  ABDOMEN/PELVIS  Gastric antrum activity is likely physiologic. Similarly there is physiologic activity in the ascending colon. Sigmoid diverticulosis. No findings of active malignancy in the abdomen or pelvis.  SKELETON  No focal hypermetabolic activity to suggest skeletal metastasis. Degenerative glenohumeral arthropathy along with cervical spondylosis. Mid to lower thoracic spondylosis and lumbar spondylosis.  IMPRESSION: 1. Prominent and hypermetabolic subcarinal and right infrahilar adenopathy compatible with malignancy, both the size and metabolic activity of these lymph nodes is significantly increased from  April 2016. 2. There is some scarring in the vicinity of the original right middle lobe nodule, but without residual hypermetabolic activity in this vicinity. 3. 7 mm lingular nodule does not currently demonstrate hypermetabolic activity, but is below sensitive PET-CT size thresholds. No change in the size of this nodule from 12/31/2014.   Electronically Signed  By: Van Clines M.D.  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  She did not need any prescription refills today.  She will come back next Monday (12/06/15) for a follow up and Cycle #3 of Carboplatin/ Paclitaxel. Weight will continue to be monitored closely. She was encouraged to come in or call for pain, nausea, vomiting or difficulty eating or drinking. She states she understands and will let us know of any difficulties moving forward.  All questions were answered. The patient knows to call the clinic with any problems, questions or concerns. We can certainly see the patient much sooner if necessary.  This document serves as a record of services personally performed by Ancil Linsey, MD. It was created on her behalf by Kandace Blitz, a trained medical scribe. The creation of this record is based on the scribe's personal observations and the provider's  statements to them. This document has been checked and approved by the attending provider.  I have reviewed the above documentation for accuracy and completeness, and I agree with the above. Molli Hazard, MD

## 2015-11-29 NOTE — Patient Instructions (Signed)
[place] at [facility] Discharge Instructions  RECOMMENDATIONS MADE BY THE CONSULTANT AND ANY TEST RESULTS WILL BE SENT TO YOUR REFERRING PHYSICIAN.   Exam and discussion by Dr Whitney Muse today Carbo/taxol today Carbo/taxol weekly  Blood work was good today If your acid reflux gets any worse please let us know  Return to see the doctor in 1 week  Please call the clinic if you have any questions or concerns     Thank you for choosing [place] at [facility] to provide your oncology and hematology care.  To afford each patient quality time with our provider, please arrive at least 15 minutes before your scheduled appointment time.   Beginning January 23rd 2017 lab work for the [facility] will be done in the  Main lab at [facility] on 1st floor. If you have a lab appointment with the [place] please come in thru the  Main Entrance and check in at the main information desk  You need to re-schedule your appointment should you arrive 10 or more minutes late.  We strive to give you quality time with our providers, and arriving late affects you and other patients whose appointments are after yours.  Also, if you no show three or more times for appointments you may be dismissed from the clinic at the providers discretion.     Again, thank you for choosing [facility].  Our hope is that these requests will decrease the amount of time that you wait before being seen by our physicians.       _____________________________________________________________  Should you have questions after your visit to [facility], please contact our office at [phone] between the hours of 8:30 a.m. and 4:30 p.m.  Voicemails left after 4:30 p.m. will not be returned until the following business day.  For prescription refill requests, have your pharmacy contact our office.         Resources For Cancer Patients and their  Caregivers ? American Cancer Society: Can assist with transportation, wigs, general needs, runs Look Good Feel Better.        763-245-6013 ? Cancer Care: Provides financial assistance, online support groups, medication/co-pay assistance.  1-800-813-HOPE 818 295 8962) ? [place] Assists [place] cancer patients and their families through emotional , educational and financial support.  806-456-4487 ? [place] DSS Where to apply for food stamps, Medicaid and utility assistance. ([phone] ? RCATS: Transportation to medical appointments. 365-364-1519 ? Social Security Administration: May apply for disability if have a Stage IV cancer. [phone] ? [employer], Disability and Transit Services: Assists with nutrition, care and transit needs. [phone]

## 2015-11-30 ENCOUNTER — Telehealth: Payer: Self-pay | Admitting: *Deleted

## 2015-11-30 ENCOUNTER — Ambulatory Visit
Admission: RE | Admit: 2015-11-30 | Discharge: 2015-11-30 | Disposition: A | Discharge status: Home / Self Care | Payer: PPO | Source: Ambulatory Visit | Attending: Radiation Oncology | Admitting: Radiation Oncology

## 2015-11-30 ENCOUNTER — Encounter: Payer: Self-pay | Admitting: Radiation Oncology

## 2015-11-30 VITALS — BP 152/75 | HR 75 | Temp 97.6°F | Resp 18 | Ht 63.0 in | Wt 169.1 lb

## 2015-11-30 DIAGNOSIS — C342 Malignant neoplasm of middle lobe, bronchus or lung: Secondary | ICD-10-CM

## 2015-11-30 DIAGNOSIS — Z510 Encounter for antineoplastic radiation therapy: Secondary | ICD-10-CM | POA: Diagnosis not present

## 2015-11-30 NOTE — Progress Notes (Signed)
Kari Bullock has received 7 fractions to her right lung.  Skin to chest normal color using Sonafine to chest.  Appetite is very good.  Energy level is good.  No pain or discomfort.  No SOB O2 sat 98%,denies any swallowing problems or sore throat. Let Kari Bullock know that her CT scan of chest from 11-25-15 looked good that she will F/U with Dr. Roxan Hockey with cardiothoracic surgery in a few weeks his office to call with appointment; then you will see Kari P.A. here at the [facility] who works with patients who have lung cancer Wt Readings from Last 3 Encounters:  11/30/15 169 lb 1.6 oz (76.703 kg)  11/29/15 167 lb 3.2 oz (75.841 kg)  11/22/15 166 lb 1.6 oz (75.342 kg)  BP 152/75 mmHg  Pulse 75  Temp(Src) 97.6 F (36.4 C) (Oral)  Resp 18  Ht '5\' 3"'$  (1.6 m)  Wt 169 lb 1.6 oz (76.703 kg)  BMI 29.96 kg/m2  SpO2 98%

## 2015-11-30 NOTE — Progress Notes (Signed)
Weekly Management Note Current Dose:14 Gy  Projected Dose:60 Gy   Narrative:  The patient presents for routine under treatment assessment.  CBCT/MVCT images/Port film x-rays were reviewed.  The chart was checked. Doing well. No complaints. Tolerating chemo well.   Physical Findings:  Unchanged  Vitals:  Filed Vitals:   11/30/15 1503  BP: 152/75  Pulse: 75  Temp: 97.6 F (36.4 C)  Resp: 18   Weight:  Wt Readings from Last 3 Encounters:  11/30/15 169 lb 1.6 oz (76.703 kg)  11/29/15 167 lb 3.2 oz (75.841 kg)  11/22/15 166 lb 1.6 oz (75.342 kg)   Lab Results  Component Value Date   WBC 4.7 11/29/2015   HGB 12.6 11/29/2015   HCT 39.0 11/29/2015   MCV 88.4 11/29/2015   PLT 156 11/29/2015   Lab Results  Component Value Date   CREATININE 0.67 11/29/2015   BUN 18 11/29/2015   NA 137 11/29/2015   K 3.5 11/29/2015   CL 104 11/29/2015   CO2 28 11/29/2015     Impression:  The patient is tolerating radiation.  Plan:  Continue treatment as planned. Discussed signs and symptoms to look for.

## 2015-11-30 NOTE — Telephone Encounter (Signed)
Called and left a message for Mrs. Blackard to call me back and ask for [facility]  Dr. Unknown Jim nurse at [phone] that I needed to give her some information from Dr. Pablo Ledger.

## 2015-11-30 NOTE — Addendum Note (Signed)
Encounter addended by: Malena Edman, RN on: 11/30/2015  4:17 PM<BR>     Documentation filed: Inpatient Patient Education

## 2015-12-01 ENCOUNTER — Ambulatory Visit
Admission: RE | Admit: 2015-12-01 | Discharge: 2015-12-01 | Disposition: A | Discharge status: Home / Self Care | Payer: PPO | Source: Ambulatory Visit | Attending: Radiation Oncology | Admitting: Radiation Oncology

## 2015-12-01 DIAGNOSIS — Z510 Encounter for antineoplastic radiation therapy: Secondary | ICD-10-CM | POA: Diagnosis not present

## 2015-12-01 DIAGNOSIS — C342 Malignant neoplasm of middle lobe, bronchus or lung: Secondary | ICD-10-CM | POA: Diagnosis not present

## 2015-12-02 ENCOUNTER — Ambulatory Visit
Admission: RE | Admit: 2015-12-02 | Discharge: 2015-12-02 | Disposition: A | Discharge status: Home / Self Care | Payer: PPO | Source: Ambulatory Visit | Attending: Radiation Oncology | Admitting: Radiation Oncology

## 2015-12-02 DIAGNOSIS — Z510 Encounter for antineoplastic radiation therapy: Secondary | ICD-10-CM | POA: Diagnosis not present

## 2015-12-02 DIAGNOSIS — C342 Malignant neoplasm of middle lobe, bronchus or lung: Secondary | ICD-10-CM | POA: Diagnosis not present

## 2015-12-03 ENCOUNTER — Ambulatory Visit
Admission: RE | Admit: 2015-12-03 | Discharge: 2015-12-03 | Disposition: A | Discharge status: Home / Self Care | Payer: PPO | Source: Ambulatory Visit | Attending: Radiation Oncology | Admitting: Radiation Oncology

## 2015-12-03 DIAGNOSIS — C342 Malignant neoplasm of middle lobe, bronchus or lung: Secondary | ICD-10-CM | POA: Diagnosis not present

## 2015-12-03 DIAGNOSIS — Z510 Encounter for antineoplastic radiation therapy: Secondary | ICD-10-CM | POA: Diagnosis not present

## 2015-12-06 ENCOUNTER — Encounter (HOSPITAL_BASED_OUTPATIENT_CLINIC_OR_DEPARTMENT_OTHER): Payer: PPO

## 2015-12-06 ENCOUNTER — Ambulatory Visit
Admission: RE | Admit: 2015-12-06 | Discharge: 2015-12-06 | Disposition: A | Discharge status: Home / Self Care | Payer: PPO | Source: Ambulatory Visit | Attending: Radiation Oncology | Admitting: Radiation Oncology

## 2015-12-06 ENCOUNTER — Encounter (HOSPITAL_COMMUNITY): Payer: Self-pay | Admitting: Hematology & Oncology

## 2015-12-06 ENCOUNTER — Encounter (HOSPITAL_BASED_OUTPATIENT_CLINIC_OR_DEPARTMENT_OTHER): Payer: PPO | Admitting: Hematology & Oncology

## 2015-12-06 VITALS — BP 139/63 | HR 80 | Temp 97.8°F | Resp 18

## 2015-12-06 VITALS — BP 144/59 | HR 88 | Temp 97.6°F | Resp 18 | Wt 165.9 lb

## 2015-12-06 DIAGNOSIS — Z853 Personal history of malignant neoplasm of breast: Secondary | ICD-10-CM

## 2015-12-06 DIAGNOSIS — Z5111 Encounter for antineoplastic chemotherapy: Secondary | ICD-10-CM

## 2015-12-06 DIAGNOSIS — C342 Malignant neoplasm of middle lobe, bronchus or lung: Secondary | ICD-10-CM

## 2015-12-06 DIAGNOSIS — R599 Enlarged lymph nodes, unspecified: Secondary | ICD-10-CM

## 2015-12-06 DIAGNOSIS — C50911 Malignant neoplasm of unspecified site of right female breast: Secondary | ICD-10-CM

## 2015-12-06 DIAGNOSIS — R590 Localized enlarged lymph nodes: Secondary | ICD-10-CM

## 2015-12-06 DIAGNOSIS — Z510 Encounter for antineoplastic radiation therapy: Secondary | ICD-10-CM | POA: Diagnosis not present

## 2015-12-06 LAB — CBC WITH DIFFERENTIAL/PLATELET
Basophils Absolute: 0 10*3/uL (ref 0.0–0.1)
Basophils Relative: 1 %
EOS PCT: 2 %
Eosinophils Absolute: 0.1 10*3/uL (ref 0.0–0.7)
HCT: 38.3 % (ref 36.0–46.0)
Hemoglobin: 12.4 g/dL (ref 12.0–15.0)
LYMPHS ABS: 0.6 10*3/uL — AB (ref 0.7–4.0)
LYMPHS PCT: 13 %
MCH: 28.6 pg (ref 26.0–34.0)
MCHC: 32.4 g/dL (ref 30.0–36.0)
MCV: 88.2 fL (ref 78.0–100.0)
MONO ABS: 0.3 10*3/uL (ref 0.1–1.0)
MONOS PCT: 6 %
Neutro Abs: 3.7 10*3/uL (ref 1.7–7.7)
Neutrophils Relative %: 78 %
PLATELETS: 159 10*3/uL (ref 150–400)
RBC: 4.34 MIL/uL (ref 3.87–5.11)
RDW: 13.4 % (ref 11.5–15.5)
WBC: 4.8 10*3/uL (ref 4.0–10.5)

## 2015-12-06 LAB — COMPREHENSIVE METABOLIC PANEL
ALBUMIN: 3.7 g/dL (ref 3.5–5.0)
ALT: 20 U/L (ref 14–54)
AST: 22 U/L (ref 15–41)
Alkaline Phosphatase: 61 U/L (ref 38–126)
Anion gap: 6 (ref 5–15)
BILIRUBIN TOTAL: 0.5 mg/dL (ref 0.3–1.2)
BUN: 15 mg/dL (ref 6–20)
CHLORIDE: 103 mmol/L (ref 101–111)
CO2: 29 mmol/L (ref 22–32)
Calcium: 10.2 mg/dL (ref 8.9–10.3)
Creatinine, Ser: 0.55 mg/dL (ref 0.44–1.00)
GFR calc Af Amer: >60 mL/min (ref >60)
GFR calc non Af Amer: >60 mL/min (ref >60)
GLUCOSE: 141 mg/dL — AB (ref 65–99)
POTASSIUM: 4 mmol/L (ref 3.5–5.1)
Sodium: 138 mmol/L (ref 135–145)
Total Protein: 6.5 g/dL (ref 6.5–8.1)

## 2015-12-06 MED ORDER — SODIUM CHLORIDE 0.9 % IV SOLN
164.2000 mg | Freq: Once | INTRAVENOUS | Status: AC
  Administered 2015-12-06: 160 mg via INTRAVENOUS
  Filled 2015-12-06: qty 16

## 2015-12-06 MED ORDER — FAMOTIDINE IN NACL 20-0.9 MG/50ML-% IV SOLN
20.0000 mg | Freq: Once | INTRAVENOUS | Status: AC
  Administered 2015-12-06: 20 mg via INTRAVENOUS

## 2015-12-06 MED ORDER — HEPARIN SOD (PORK) LOCK FLUSH 100 UNIT/ML IV SOLN
500.0000 [IU] | Freq: Once | INTRAVENOUS | Status: AC | PRN
  Administered 2015-12-06: 500 [IU]

## 2015-12-06 MED ORDER — PALONOSETRON HCL INJECTION 0.25 MG/5ML
0.2500 mg | Freq: Once | INTRAVENOUS | Status: AC
  Administered 2015-12-06: 0.25 mg via INTRAVENOUS
  Filled 2015-12-06: qty 5

## 2015-12-06 MED ORDER — PACLITAXEL CHEMO INJECTION 300 MG/50ML
45.0000 mg/m2 | Freq: Once | INTRAVENOUS | Status: AC
  Administered 2015-12-06: 84 mg via INTRAVENOUS
  Filled 2015-12-06: qty 14

## 2015-12-06 MED ORDER — SODIUM CHLORIDE 0.9% FLUSH: 10.0000 mL | INTRAVENOUS | Status: DC | PRN

## 2015-12-06 MED ORDER — SODIUM CHLORIDE 0.9 % IV SOLN
Freq: Once | INTRAVENOUS | Status: AC
  Administered 2015-12-06: 11:00:00 via INTRAVENOUS

## 2015-12-06 MED ORDER — DIPHENHYDRAMINE HCL 50 MG/ML IJ SOLN
50.0000 mg | Freq: Once | INTRAMUSCULAR | Status: AC
  Administered 2015-12-06: 50 mg via INTRAVENOUS
  Filled 2015-12-06: qty 1

## 2015-12-06 MED ORDER — SODIUM CHLORIDE 0.9 % IV SOLN
20.0000 mg | Freq: Once | INTRAVENOUS | Status: AC
  Administered 2015-12-06: 20 mg via INTRAVENOUS
  Filled 2015-12-06: qty 2

## 2015-12-06 NOTE — Patient Instructions (Addendum)
[place] at [facility] Discharge Instructions  RECOMMENDATIONS MADE BY THE CONSULTANT AND ANY TEST RESULTS WILL BE SENT TO YOUR REFERRING PHYSICIAN.   Exam and discussion by Dr Whitney Muse today Cycle 3 of carbo/taxol as long as labs are good  Return to see the doctor in 1 week as scheduled along with chemotherapy  Please call the clinic if you have any questions or concerns    Thank you for choosing [place] at [facility] to provide your oncology and hematology care.  To afford each patient quality time with our provider, please arrive at least 15 minutes before your scheduled appointment time.   Beginning January 23rd 2017 lab work for the [facility] will be done in the  Main lab at [facility] on 1st floor. If you have a lab appointment with the [place] please come in thru the  Main Entrance and check in at the main information desk  You need to re-schedule your appointment should you arrive 10 or more minutes late.  We strive to give you quality time with our providers, and arriving late affects you and other patients whose appointments are after yours.  Also, if you no show three or more times for appointments you may be dismissed from the clinic at the providers discretion.     Again, thank you for choosing [facility].  Our hope is that these requests will decrease the amount of time that you wait before being seen by our physicians.       _____________________________________________________________  Should you have questions after your visit to [facility], please contact our office at [phone] between the hours of 8:30 a.m. and 4:30 p.m.  Voicemails left after 4:30 p.m. will not be returned until the following business day.  For prescription refill requests, have your pharmacy contact our office.         Resources For Cancer Patients and their Caregivers ? American Cancer Society: Can  assist with transportation, wigs, general needs, runs Look Good Feel Better.        [phone] ? Cancer Care: Provides financial assistance, online support groups, medication/co-pay assistance.  1-800-813-HOPE 404-102-9095) ? [place] Assists [place] cancer patients and their families through emotional , educational and financial support.  ([phone] ? [place] DSS Where to apply for food stamps, Medicaid and utility assistance. 815-469-0482 ? RCATS: Transportation to medical appointments. [phone] ? Social Security Administration: May apply for disability if have a Stage IV cancer. [phone] [phone] ? [employer], Disability and Transit Services: Assists with nutrition, care and transit needs. [phone]

## 2015-12-06 NOTE — Progress Notes (Signed)
Patient tolerated infusion well.  VSS.   

## 2015-12-06 NOTE — Patient Instructions (Signed)
[facility] Discharge Instructions for Patients Receiving Chemotherapy   Beginning January 23rd 2017 lab work for the [facility] will be done in the  Main lab at [facility] on 1st floor. If you have a lab appointment with the [place] please come in thru the  Main Entrance and check in at the main information desk   Today you received the following chemotherapy agents: Taxol and Carboplatin.     If you develop nausea and vomiting, or diarrhea that is not controlled by your medication, call the clinic.  The clinic phone number is [phone]. Office hours are Monday-Friday 8:30am-5:00pm.  BELOW ARE SYMPTOMS THAT SHOULD BE REPORTED IMMEDIATELY:  *FEVER GREATER THAN 101.0 F  *CHILLS WITH OR WITHOUT FEVER  NAUSEA AND VOMITING THAT IS NOT CONTROLLED WITH YOUR NAUSEA MEDICATION  *UNUSUAL SHORTNESS OF BREATH  *UNUSUAL BRUISING OR BLEEDING  TENDERNESS IN MOUTH AND THROAT WITH OR WITHOUT PRESENCE OF ULCERS  *URINARY PROBLEMS  *BOWEL PROBLEMS  UNUSUAL RASH Items with * indicate a potential emergency and should be followed up as soon as possible. If you have an emergency after office hours please contact your primary care physician or go to the nearest emergency department.  Please call the clinic during office hours if you have any questions or concerns.   You may also contact the Patient Navigator at [phone] should you have any questions or need assistance in obtaining follow up care.      Resources For Cancer Patients and their Caregivers ? American Cancer Society: Can assist with transportation, wigs, general needs, runs Look Good Feel Better.        218-708-9764 ? Cancer Care: Provides financial assistance, online support groups, medication/co-pay assistance.  1-800-813-HOPE (386) 706-5090) ? [facility] Assists [place] cancer patients and their families through emotional , educational and financial support.   508-240-8216 ? [place] DSS Where to apply for food stamps, Medicaid and utility assistance. ([phone] ? RCATS: Transportation to medical appointments. 339-159-1805 ? Social Security Administration: May apply for disability if have a Stage IV cancer. [phone] ? [employer], Disability and Transit Services: Assists with nutrition, care and transit needs. [phone]

## 2015-12-06 NOTE — Progress Notes (Signed)
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 ER +90%, PR 30% Her -2 neu indeterminate. Surgery, XRT, hormonal therapy. Intolerant of Tamoxifen, arimidex X 5 years completed 02/15/2005 CT abdomen 12/18/2014 6 mm RML pulmonary nodule, 17 mm nodule in LL Base was 41m previously PET/CT 7 mm RML nodule hypermetabolic, hypermetabolic activity along the posterior inferior margin of the R mainstem bronchus, non-enlarged subcarinal LN EBUS 01/19/2015 with Dr. VPrescott Gum Lung, core biopsy RML on 07/15/2015 biopsy positive for poorly differentiated carcinoma, staining pattern favors poorly differentiated squamous cell carcinoma.  Wang needle FNA 7 Node #2, no malignant cells PET imaging with R infrahilar and subcarinal adenopathy   CURRENT THERAPY:  To begin concurrent chemo/XRT  INTERVAL HISTORY: BVALLI RANDOL75y.o. female returns today for additional f/u of a locally advanced NSCLC.  [facility] the right breast, grade 2 (T1 A., N1 a) with 2 of 4 positive lymph nodes, ER +90%, PR +30%, lymph node metastases were 1 mm or slightly less. HER-2/neu was indeterminant at 2+. She was diagnosed in February 2001 treated with surgery followed by radiation therapy and then hormonal therapy. She could not tolerate tamoxifen so was switched to Arimidex which she took for total 5 years ending on 02/15/2005, no evidence of disease.  She underwent XRT with [name] 09/08/15-09/15/15: for a squamous cell carcinoma of the Right middle lobe/ 54 Gy at 18 GY per fraction x 3 fractions. Unfortunately repeat imaging has shown disease within the mediastinal and infrahilar regions. She has opted for concurrent therapy. She is here today for ongoing chemotherapy.  Ms. TDifrancescois  accompanied by her husband. She is here for cycle #3 Carboplatin/Paclitaxel.   She forgot to numb her port site this morning.  Denies any issues swallowing. Denies any pain, nausea, or vomiting. Denies chest pain. When asked how her appetite was, states she is always hungry. Husband states, "she eats whatever I give her". She has not experienced any problems with chemotherapy or radiation treatment.    Past Medical History  Diagnosis Date  . Hypertension   . Breast cancer ([facility]     2001 RT breat lumpectomy rad tx  . GERD (gastroesophageal reflux disease)   . Memory loss   . Pure hypercholesterolemia   . Memory change 10/24/2013  . Infiltrating ductal carcinoma of right breast ([facility] 01/09/2015  . Pulmonary nodule 01/09/2015  . Headache     years ago, none since hysterectomy  . Anemia     in the past  . Diarrhea   . Complication of anesthesia     confusion  . Lung cancer ([facility]     has Memory change; Periumbilical pain; Diarrhea; GERD (gastroesophageal reflux disease); Infiltrating ductal carcinoma of right breast ([facility]; Pulmonary nodule; Diverticulosis of colon without hemorrhage; Chronic diarrhea; Mediastinal adenopathy; Mucosal abnormality of stomach; and Primary cancer of right middle lobe of lung ([place] on her problem [place].     is allergic to codeine; morphine and related; adhesive; sulfur; and tamoxifen.  Ms. TBreaudoes not currently have medications on file.  Past Surgical History  Procedure Laterality Date  . Abdominal hysterectomy    . Bilateral oophorectomy  2007  . Incision and drainage of wound  2011    buttocks  .  Tonsillectomy    . Cataract extraction Bilateral     lens implant  . Carpal tunnel release Right   . Colonoscopy  2011    RMR: 1. Anal papilla, otherwise normal rectum. 2. Left-sided diverticula, single diminutive polyp in the sigmoid segment status post cold biospy removal. Remainder of the colonic mucosa appeared normal.   . Colonoscopy N/A 01/14/2015     Procedure: COLONOSCOPY;  Surgeon: Daneil Dolin, MD;  Location: [facility];  Service: Endoscopy;  Laterality: N/A;  930 - moved to 10:15  . Esophagogastroduodenoscopy N/A 01/14/2015    RMR: Colonic diverticulosis. status post segmental biospy and stool sampling.  No egd done today  . Breast surgery Right     right lumpectomy, lymph glands remove  . Video bronchoscopy with endobronchial ultrasound N/A 01/19/2015    Procedure: VIDEO BRONCHOSCOPY WITH ENDOBRONCHIAL ULTRASOUND;  Surgeon: Ivin Poot, MD;  Location: [facility] OR;  Service: Thoracic;  Laterality: N/A;  . Esophagogastroduodenoscopy N/A 02/11/2015    RMR: Focally abnormal antrum of doubtful clinical significance- Status post biopsy.   . Video bronchoscopy with endobronchial ultrasound N/A 07/27/2015    Procedure: VIDEO BRONCHOSCOPY WITH ENDOBRONCHIAL ULTRASOUND;  Surgeon: Ivin Poot, MD;  Location: [facility] OR;  Service: Thoracic;  Laterality: N/A;    Denies any headaches, dizziness, double vision, fevers, chills, night sweats, nausea, vomiting, diarrhea, constipation, chest pain, heart palpitations, shortness of breath, blood in stool, black tarry stool, urinary pain, urinary burning, urinary frequency, hematuria, appetite loss.  14 point review of systems was performed and is negative except as detailed under history of present illness and above    PHYSICAL EXAMINATION  ECOG PERFORMANCE STATUS: 1 - Symptomatic but completely ambulatory   Filed Vitals:   12/06/15 0800  BP: 144/59  Pulse: 88  Temp: 97.6 F (36.4 C)  Resp: 18   GENERAL:alert, no distress, well nourished, well developed, comfortable, cooperative, obese, smiling and accompanied by her husband. Able to get on examination table with assistance. SKIN: skin color, texture, turgor are normal, no rashes or significant lesions. HEAD: Normocephalic, No masses, lesions, tenderness or abnormalities EYES: normal, PERRLA, EOMI, Conjunctiva are pink and non-injected EARS:  External ears normal OROPHARYNX:lips, buccal mucosa, and tongue normal and mucous membranes are moist  NECK: supple, no adenopathy, thyroid normal size, non-tender, without nodularity, trachea midline LYMPH:  no palpable lymphadenopathy BREAST:not examined LUNGS: clear to auscultation  HEART: regular rate & rhythm, no murmurs, no gallops, S1 normal and S2 normal ABDOMEN:abdomen soft, non-tender, obese and normal bowel sounds BACK: Back symmetric, no curvature., No CVA tenderness EXTREMITIES:less then 2 second capillary refill, no joint deformities, effusion, or inflammation, no skin discoloration, no clubbing, no cyanosis  NEURO: alert & oriented x 3 with fluent speech, no focal motor/sensory deficits, gait normal  LABORATORY DATA: I have reviewed the data as listed. CBC    Component Value Date/Time   WBC 4.8 12/06/2015 0926   RBC 4.34 12/06/2015 0926   HGB 12.4 12/06/2015 0926   HCT 38.3 12/06/2015 0926   PLT 159 12/06/2015 0926   MCV 88.2 12/06/2015 0926   MCH 28.6 12/06/2015 0926   MCHC 32.4 12/06/2015 0926   RDW 13.4 12/06/2015 0926   LYMPHSABS 0.6* 12/06/2015 0926   MONOABS 0.3 12/06/2015 0926   EOSABS 0.1 12/06/2015 0926   BASOSABS 0.0 12/06/2015 0926      Chemistry      Component Value Date/Time   NA 138 12/06/2015 0926   K 4.0 12/06/2015 0926  CL 103 12/06/2015 0926   CO2 29 12/06/2015 0926   BUN 15 12/06/2015 0926   CREATININE 0.55 12/06/2015 0926   CREATININE 0.69 12/16/2014 1058      Component Value Date/Time   CALCIUM 10.2 12/06/2015 0926   ALKPHOS 61 12/06/2015 0926   AST 22 12/06/2015 0926   ALT 20 12/06/2015 0926   BILITOT 0.5 12/06/2015 0926     RADIOGRAPHY: I have personally reviewed the radiological images as listed and agreed with the findings in the report. Study Result     CLINICAL DATA: 75 year old female with lung cancer, progressive mediastinal lymphadenopathy. Restaging. Subsequent encounter.  EXAM: MRI HEAD WITHOUT AND WITH  CONTRAST  TECHNIQUE: Multiplanar, multiecho pulse sequences of the brain and surrounding structures were obtained without and with intravenous contrast.  CONTRAST: 55m MULTIHANCE GADOBENATE DIMEGLUMINE 529 MG/ML IV SOLN  COMPARISON: Brain MRI 03/19/2015  FINDINGS: No midline shift, mass effect, or evidence of intracranial mass lesion. No abnormal enhancement identified. No dural thickening identified.  Major intracranial vascular flow voids are stable. No restricted diffusion to suggest acute infarction. No ventriculomegaly, extra-axial collection or acute intracranial hemorrhage. Cervicomedullary junction and pituitary are within normal limits.  Negative visualized cervical spine and spinal cord. Stable visualized bone marrow signal, no destructive osseous lesion identified.  Stable gray and white matter signal throughout the brain. Mild to moderate nonspecific cerebral white matter T2 and FLAIR hyperintensity is stable. No chronic cerebral blood products or cortical encephalomalacia.  Visible internal auditory structures appear normal. Incidental high riding left internal jugular bulb. Mastoids are clear. Interval resolved paranasal sinus mucosal thickening. Stable postoperative appearance of both globes. Otherwise negative orbit and scalp soft tissues.  IMPRESSION: 1. No acute or metastatic intracranial abnormality. 2. Stable MRI appearance of the brain since 2016.   Electronically Signed  By: HGenevie AnnM.D.  On: 11/23/2015 13:08    Study Result     CLINICAL DATA: Subsequent treatment strategy for right lung cancer (right middle lobe).  EXAM: NUCLEAR MEDICINE PET SKULL BASE TO THIGH  TECHNIQUE: 8.1 mCi F-18 FDG was injected intravenously. Full-ring PET imaging was performed from the skull base to thigh after the radiotracer. CT data was obtained and used for attenuation correction and anatomic localization.  FASTING BLOOD GLUCOSE:  Value: 103 mg/dl  COMPARISON: 07/15/2015  FINDINGS: NECK  No hypermetabolic lymph nodes in the neck.  CHEST  Subcarinal node 2 cm diameter, maximum standard uptake value the 30.5. This is partially continuous with a right infrahilar node measuring 1.2 cm in diameter with maximum standard uptake value 27.5.  In the space previously occupied by the right middle lobe nodule there is only some indistinct architectural distortion/scar-like appearance, without significant residual hypermetabolic activity at the site of the nodule. No significant abnormal hypermetabolic activity associated with the 7 mm lingular nodule currently observed. The density along the lower lingula is probably atelectasis adjacent to epicardial adipose tissue and not hypermetabolic.  ABDOMEN/PELVIS  Gastric antrum activity is likely physiologic. Similarly there is physiologic activity in the ascending colon. Sigmoid diverticulosis. No findings of active malignancy in the abdomen or pelvis.  SKELETON  No focal hypermetabolic activity to suggest skeletal metastasis. Degenerative glenohumeral arthropathy along with cervical spondylosis. Mid to lower thoracic spondylosis and lumbar spondylosis.  IMPRESSION: 1. Prominent and hypermetabolic subcarinal and right infrahilar adenopathy compatible with malignancy, both the size and metabolic activity of these lymph nodes is significantly increased from April 2016. 2. There is some scarring in the vicinity of the original right middle  lobe nodule, but without residual hypermetabolic activity in this vicinity. 3. 7 mm lingular nodule does not currently demonstrate hypermetabolic activity, but is below sensitive PET-CT size thresholds. No change in the size of this nodule from 12/31/2014.   Electronically Signed  By: Van Clines M.D.  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  She did not need any prescription refills today.  She will come back next Monday (12/13/15) for a follow up and Cycle #4 of Carboplatin/ Paclitaxel. Weight will continue to be monitored closely. She was encouraged to come in or call for pain, nausea, vomiting or difficulty eating or drinking. She states she understands and will let us know of any difficulties moving forward.  All questions were answered. The patient knows to call the clinic with any problems, questions or concerns. We can certainly see the patient much sooner if necessary.  This document serves as a record of services personally performed by Ancil Linsey, MD. It was created on her behalf by Arlyce Harman, a trained medical scribe. The creation of this record is based on the scribe's personal observations and the provider's statements to them. This document has been checked and approved by the attending provider.  I have reviewed the above documentation for accuracy and completeness, and I agree with the above. Molli Hazard, MD

## 2015-12-07 ENCOUNTER — Encounter: Payer: Self-pay | Admitting: Radiation Oncology

## 2015-12-07 ENCOUNTER — Ambulatory Visit
Admission: RE | Admit: 2015-12-07 | Discharge: 2015-12-07 | Disposition: A | Discharge status: Home / Self Care | Payer: PPO | Source: Ambulatory Visit | Attending: Radiation Oncology | Admitting: Radiation Oncology

## 2015-12-07 VITALS — BP 126/66 | HR 90 | Temp 97.0°F | Resp 16 | Ht 63.0 in | Wt 169.1 lb

## 2015-12-07 DIAGNOSIS — Z510 Encounter for antineoplastic radiation therapy: Secondary | ICD-10-CM | POA: Diagnosis not present

## 2015-12-07 DIAGNOSIS — C342 Malignant neoplasm of middle lobe, bronchus or lung: Secondary | ICD-10-CM | POA: Diagnosis not present

## 2015-12-07 NOTE — Progress Notes (Signed)
Kari Bullock has received 12 fractions to her right lung.  Appetite is good drinking ensure.  Skin to right chest has normal skin color using Sonafine.  Energy level is good stays busy.  Denies pain.  Receives chemotherapy every Monday at [place] [facility] BP 126/66 mmHg  Pulse 90  Temp(Src) 97 F (36.1 C) (Oral)  Resp 16  Ht '5\' 3"'$  (1.6 m)  Wt 169 lb 1.6 oz (76.703 kg)  BMI 29.96 kg/m2  SpO2 100%

## 2015-12-07 NOTE — Progress Notes (Signed)
Weekly Management Note Current Dose:24 Gy  Projected Dose:60 Gy   Narrative:  The patient presents for routine under treatment assessment.  CBCT/MVCT images/Port film x-rays were reviewed.  The chart was checked. Doing well. No complaints. Tolerating chemo well. Face is flushed today for some reason.   Physical Findings: No skin changes.   Vitals:  Filed Vitals:   12/07/15 1401  BP: 126/66  Pulse: 90  Temp: 97 F (36.1 C)  Resp: 16   Weight:  Wt Readings from Last 3 Encounters:  12/07/15 169 lb 1.6 oz (76.703 kg)  12/06/15 165 lb 14.4 oz (75.252 kg)  11/30/15 169 lb 1.6 oz (76.703 kg)   Lab Results  Component Value Date   WBC 4.8 12/06/2015   HGB 12.4 12/06/2015   HCT 38.3 12/06/2015   MCV 88.2 12/06/2015   PLT 159 12/06/2015   Lab Results  Component Value Date   CREATININE 0.55 12/06/2015   BUN 15 12/06/2015   NA 138 12/06/2015   K 4.0 12/06/2015   CL 103 12/06/2015   CO2 29 12/06/2015     Impression:  The patient is tolerating radiation.  Plan:  Continue treatment as planned. Monitor facial flushing.

## 2015-12-08 ENCOUNTER — Ambulatory Visit
Admission: RE | Admit: 2015-12-08 | Discharge: 2015-12-08 | Disposition: A | Discharge status: Home / Self Care | Payer: PPO | Source: Ambulatory Visit | Attending: Radiation Oncology | Admitting: Radiation Oncology

## 2015-12-08 DIAGNOSIS — Z510 Encounter for antineoplastic radiation therapy: Secondary | ICD-10-CM | POA: Diagnosis not present

## 2015-12-08 DIAGNOSIS — C342 Malignant neoplasm of middle lobe, bronchus or lung: Secondary | ICD-10-CM | POA: Diagnosis not present

## 2015-12-09 ENCOUNTER — Ambulatory Visit
Admission: RE | Admit: 2015-12-09 | Discharge: 2015-12-09 | Disposition: A | Discharge status: Home / Self Care | Payer: PPO | Source: Ambulatory Visit | Attending: Radiation Oncology | Admitting: Radiation Oncology

## 2015-12-09 DIAGNOSIS — Z510 Encounter for antineoplastic radiation therapy: Secondary | ICD-10-CM | POA: Diagnosis not present

## 2015-12-09 DIAGNOSIS — C342 Malignant neoplasm of middle lobe, bronchus or lung: Secondary | ICD-10-CM | POA: Diagnosis not present

## 2015-12-10 ENCOUNTER — Ambulatory Visit
Admission: RE | Admit: 2015-12-10 | Discharge: 2015-12-10 | Disposition: A | Discharge status: Home / Self Care | Payer: PPO | Source: Ambulatory Visit | Attending: Radiation Oncology | Admitting: Radiation Oncology

## 2015-12-10 DIAGNOSIS — Z510 Encounter for antineoplastic radiation therapy: Secondary | ICD-10-CM | POA: Diagnosis not present

## 2015-12-10 DIAGNOSIS — C342 Malignant neoplasm of middle lobe, bronchus or lung: Secondary | ICD-10-CM | POA: Diagnosis not present

## 2015-12-12 NOTE — Progress Notes (Signed)
Kari Albee, MD [place] [address]  Primary cancer of right middle lobe of lung [facility])  CURRENT THERAPY: Carboplatin/Paclitaxel weekly beginning on 11/22/2015 with concomitant XRT by Dr. Pablo Ledger in [place] beginning on 11/24/2015.  INTERVAL HISTORY: DONNA SNOOKS 75 y.o. female returns for followup of Stage III (T1AN2Mo) squamous cell carcinoma to subcarinal and right infrahilar lymphadenopathy on PET imaging.    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 Although growth of the left basilar nodule has been slow, neoplasm cannot be excluded.   12/31/2014 PET scan The 7 mm right middle lobe nodule is hypermetabolic, and there is considerable focal hypermetabolic activity along the posterior inferior margin of the right mainstem bronchus, presumably in the nonenlarged subcarinal lymph node in this vicinity...   03/19/2015 Imaging MRI brain- No evidence of metastatic disease to the brain or meninges.   06/30/2015 Imaging CT chest- Significant interval enlargement of right middle lobe 15 mm nodule (previously 7 mm) and 17 mm subcarinal lymph node. Stable lingular and anterior left lower lobe nodules.   07/15/2015 Procedure CT guided biopsy of right middle lobe nodule, by IR   07/15/2015 Pathology Results Lung, needle/core biopsy(ies), right middle lobe - POSITIVE FOR POORLY DIFFERENTIATED CARCINOMA   07/27/2015 Procedure Fiberoptic video bronchoscopy, endobronchial ultrasound- guided transbronchial biopsy of level 7 mediastinal lymph node - subcarinal lymph node by Dr. Tharon Aquas Tright.   07/27/2015 Pathology Results WANG NEEDLE, FINE NEEDLE ASPIRATION, 7 NODE #2, B (SPECIMEN 2 OF 2, COLLECTED ON 07/27/15): NO MALIGNANT CELLS IDENTIFIED. FINE NEEDLE ASPIRATION, ENDOSCOPIC SPECIMEN A, EBUS 7 NODE  (SPECIMEN 1 OF 2, COLLECTED ON 07/27/2015):NO MALIGNANT CELLS IDENTIFIED.   09/08/2015 - 09/15/2015 Radiation Therapy Right middle lobe/ 54 Gy at 18 GY per fraction x 3 fractions by Dr. Pablo Ledger   10/27/2015 Imaging CT chest- response to therapy of the right middle lobe lung lesion. Only presumed radiation induced scarring remains. Progression of mediastinal adenopathy with probable developing right infrahilar nodal metastasis. Smaller pulmonary nodules are stable   11/08/2015 PET scan Prominent and hypermetabolic subcarinal and right infrahilar adenopathy compatible with malignancy, both the size and metabolic activity of these lymph nodes is significantly increased from April 2016.   11/08/2015 Relapse/Recurrence PET shows progressive disease   11/22/2015 -  Chemotherapy Carboplatin/Paclitaxel weekly   11/23/2015 Imaging MRI brain- No acute or metastatic intracranial abnormality.  Stable MRI appearance of the brain since 2016.   11/24/2015 -  Radiation Therapy Dr. Pablo Ledger    I personally reviewed and went over laboratory results with the patient.  The results are noted within this dictation.  Labs will be updated today.  Labs satisfy treatment parameters today.  She continues to tolerate treatment without any problems. She is doing very well. She denies any nausea or vomiting. She denies any cough or hemoptysis.  Past Medical History  Diagnosis Date  . Hypertension   . Breast cancer ([place])     2001 RT breat lumpectomy rad tx  . GERD (gastroesophageal reflux disease)   . Memory loss   . Pure hypercholesterolemia   . Memory change 10/24/2013  . 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 Pulmonary nodule 01/09/2015  . Headache     years ago, none since hysterectomy  . Anemia  in the past  . Diarrhea   . Complication of anesthesia     confusion  . 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     is allergic to codeine; morphine and related; adhesive; sulfur; and tamoxifen.  Current Outpatient Prescriptions on File Prior to Visit  Medication Sig Dispense Refill  . aspirin EC 81 MG tablet Take 81 mg by mouth daily.    Marland Kitchen CARBOPLATIN IV Inject into the vein. Weekly starting 11/22/15    . cholecalciferol (VITAMIN D) 1000 UNITS tablet Take 1,000 Units by mouth at bedtime. D3    . donepezil (ARICEPT) 10 MG tablet TAKE 1 TABLET BY MOUTH EVERY NIGHT AT BEDTIME 90 tablet 1  . lidocaine-prilocaine (EMLA) cream Apply a quarter size amount to port site 1 hour prior to chemo. Do not rub in. Cover with plastic wrap. 30 g 3  . losartan-hydrochlorothiazide (HYZAAR) 50-12.5 MG per tablet Take 1 tablet by mouth daily.     . naproxen sodium (ANAPROX) 220 MG tablet Take 220 mg by mouth 2 (two) times daily as needed (pain). ALEVE    . omeprazole (PRILOSEC) 20 MG capsule TAKE 1 CAPSULE(20 MG) BY MOUTH TWICE DAILY BEFORE A MEAL 60 capsule 3  . ondansetron (ZOFRAN) 8 MG tablet Take 1 tablet (8 mg total) by mouth every 8 (eight) hours as needed for nausea or vomiting. 30 tablet 2  . PACLitaxel (TAXOL IV) Inject into the vein. Weekly starting 11/22/15    . prochlorperazine (COMPAZINE) 10 MG tablet Take 1 tablet (10 mg total) by mouth every 6 (six) hours as needed for nausea or vomiting. 30 tablet 2   Current Facility-Administered Medications on File Prior to Visit  Medication Dose Route Frequency Provider Last Rate Last Dose  . sodium chloride flush (NS) 0.9 % injection 10 mL  10 mL Intracatheter PRN Patrici Ranks, MD   10 mL at 12/13/15 4174    Past Surgical History  Procedure Laterality Date  . Abdominal hysterectomy    . Bilateral oophorectomy  2007  . Incision and drainage of wound  2011    buttocks    . Tonsillectomy    . Cataract extraction Bilateral     lens implant  . Carpal tunnel release Right   . Colonoscopy  2011    RMR: 1. Anal papilla, otherwise normal rectum. 2. Left-sided diverticula, single diminutive polyp in the sigmoid segment status post cold biospy removal. Remainder of the colonic mucosa appeared normal.   . Colonoscopy N/A 01/14/2015    Procedure: COLONOSCOPY;  Surgeon: Daneil Dolin, MD;  [facility];  Service: Endoscopy;  Laterality: N/A;  930 - moved to 10:15  . Esophagogastroduodenoscopy N/A 01/14/2015    RMR: Colonic diverticulosis. status post segmental biospy and stool sampling.  No egd done today  . Breast surgery Right     right lumpectomy, lymph glands remove  . Video bronchoscopy with endobronchial ultrasound N/A 01/19/2015    Procedure: VIDEO BRONCHOSCOPY WITH ENDOBRONCHIAL ULTRASOUND;  Surgeon: Ivin Poot, MD;  Location: [facility] OR;  Service: Thoracic;  Laterality: N/A;  . Esophagogastroduodenoscopy N/A 02/11/2015    RMR: Focally abnormal antrum of doubtful clinical significance- Status post biopsy.   . Video bronchoscopy with endobronchial ultrasound N/A 07/27/2015  Procedure: VIDEO BRONCHOSCOPY WITH ENDOBRONCHIAL ULTRASOUND;  Surgeon: Ivin Poot, MD;  Location: [facility] OR;  Service: Thoracic;  Laterality: N/A;    Denies any headaches, dizziness, double vision, fevers, chills, night sweats, nausea, vomiting, diarrhea, constipation, chest pain, heart palpitations, shortness of breath, blood in stool, black tarry stool, urinary pain, urinary burning, urinary frequency, hematuria.   PHYSICAL EXAMINATION  ECOG PERFORMANCE STATUS: 1 - Symptomatic but completely ambulatory  Filed Vitals:   12/13/15 0823  BP: 159/70  Pulse: 75  Temp: 97.6 F (36.4 C)  Resp: 18    GENERAL:alert, no distress, well nourished, well developed, comfortable, cooperative, smiling and accompanied by her husband. SKIN: skin color, texture, turgor are normal, no rashes  or significant lesions HEAD: Normocephalic, No masses, lesions, tenderness or abnormalities EYES: normal, EOMI, Conjunctiva are pink and non-injected EARS: External ears normal OROPHARYNX:lips, buccal mucosa, and tongue normal and mucous membranes are moist  NECK: supple, thyroid normal size, non-tender, without nodularity, trachea midline LYMPH:  no palpable lymphadenopathy BREAST:not examined LUNGS: clear to auscultation and percussion HEART: regular rate & rhythm ABDOMEN:abdomen soft and normal bowel sounds BACK: Back symmetric, no curvature. EXTREMITIES:less then 2 second capillary refill, no joint deformities, effusion, or inflammation, no skin discoloration, no cyanosis  NEURO: alert & oriented x 3 with fluent speech, no focal motor/sensory deficits, gait normal   LABORATORY DATA: CBC    Component Value Date/Time   WBC 3.1* 12/13/2015 0838   RBC 4.04 12/13/2015 0838   HGB 11.8* 12/13/2015 0838   HCT 36.1 12/13/2015 0838   PLT 120* 12/13/2015 0838   MCV 89.4 12/13/2015 0838   MCH 29.2 12/13/2015 0838   MCHC 32.7 12/13/2015 0838   RDW 13.6 12/13/2015 0838   LYMPHSABS 0.5* 12/13/2015 0838   MONOABS 0.3 12/13/2015 0838   EOSABS 0.1 12/13/2015 0838   BASOSABS 0.1 12/13/2015 0838      Chemistry      Component Value Date/Time   NA 140 12/13/2015 0838   K 3.8 12/13/2015 0838   CL 107 12/13/2015 0838   CO2 27 12/13/2015 0838   BUN 15 12/13/2015 0838   CREATININE 0.62 12/13/2015 0838   CREATININE 0.69 12/16/2014 1058      Component Value Date/Time   CALCIUM 9.5 12/13/2015 0838   ALKPHOS 60 12/13/2015 0838   AST 25 12/13/2015 0838   ALT 21 12/13/2015 0838   BILITOT 0.6 12/13/2015 0838        PENDING LABS:   RADIOGRAPHIC STUDIES:  Mr Jeri Cos Wo Contrast  11/23/2015  CLINICAL DATA:  75 year old female with lung cancer, progressive mediastinal lymphadenopathy. Restaging. Subsequent encounter. EXAM: MRI HEAD WITHOUT AND WITH CONTRAST TECHNIQUE: Multiplanar,  multiecho pulse sequences of the brain and surrounding structures were obtained without and with intravenous contrast. CONTRAST:  44m MULTIHANCE GADOBENATE DIMEGLUMINE 529 MG/ML IV SOLN COMPARISON:  Brain MRI 03/19/2015 FINDINGS: No midline shift, mass effect, or evidence of intracranial mass lesion. No abnormal enhancement identified. No dural thickening identified. Major intracranial vascular flow voids are stable. No restricted diffusion to suggest acute infarction. No ventriculomegaly, extra-axial collection or acute intracranial hemorrhage. Cervicomedullary junction and pituitary are within normal limits. Negative visualized cervical spine and spinal cord. Stable visualized bone marrow signal, no destructive osseous lesion identified. Stable gray and white matter signal throughout the brain. Mild to moderate nonspecific cerebral white matter T2 and FLAIR hyperintensity is stable. No chronic cerebral blood products or cortical encephalomalacia. Visible internal auditory structures appear normal. Incidental high riding left internal jugular bulb.  Mastoids are clear. Interval resolved paranasal sinus mucosal thickening. Stable postoperative appearance of both globes. Otherwise negative orbit and scalp soft tissues. IMPRESSION: 1.  No acute or metastatic intracranial abnormality. 2. Stable MRI appearance of the brain since 2016. Electronically Signed   By: Genevie Ann M.D.   On: 11/23/2015 13:08   Ir Fluoro Guide Cv Line Left  11/16/2015  CLINICAL DATA:  Breast cancer EXAM: TUNNEL POWER PORT PLACEMENT WITH SUBCUTANEOUS POCKET UTILIZING ULTRASOUND & FLOUROSCOPY FLUOROSCOPY TIME:  24 seconds MEDICATIONS AND MEDICAL HISTORY: Versed 3.5 mg, Fentanyl 100 mcg. Additional Medications: Clindamycin 600 mg, given within 1 hour of the procedure. Nursing monitored the patient during sedation. ANESTHESIA/SEDATION: Moderate sedation time: 56 minutes CONTRAST:  None PROCEDURE: After written informed consent was obtained, patient  was placed in the supine position on angiographic table. The right neck and chest was prepped and draped in a sterile fashion. Lidocaine was utilized for local anesthesia. The left jugular vein was noted to be patent initially with ultrasound. Under sonographic guidance, a micropuncture needle was inserted into the right IJ vein (Ultrasound and fluoroscopic image documentation was performed). The needle was removed over an 018 wire which was exchanged for a Amplatz. This was advanced into the IVC. An 8-French dilator was advanced over the Amplatz. A small incision was made in the left upper chest over the anterior right second rib. Utilizing blunt dissection, a subcutaneous pocket was created in the caudal direction. The pocket was irrigated with a copious amount of sterile normal saline. The port catheter was tunneled from the chest incision, and out the neck incision. The reservoir was inserted into the subcutaneous pocket and secured with two 3-0 Ethilon stitches. A peel-away sheath was advanced over the Amplatz wire. The port catheter was cut to measure length and inserted through the peel-away sheath. The peel-away sheath was removed. The chest incision was closed with 3-0 Vicryl interrupted stitches for the subcutaneous tissue and a running of 4-0 Vicryl subcuticular stitch for the skin. The neck incision was closed with a 4-0 Vicryl subcuticular stitch. Derma-bond was applied to both surgical incisions. The port reservoir was flushed and instilled with heparinized saline. No complications. FINDINGS: A right IJ vein Port-A-Cath is in place with its tip at the cavoatrial junction. COMPLICATIONS: None IMPRESSION: Successful 8 French left internal jugular vein power port placement with its tip at the SVC/RA junction. Electronically Signed   By: Marybelle Killings M.D.   On: 11/16/2015 08:38   Ir US Guide Vasc Access Left  11/16/2015  CLINICAL DATA:  Breast cancer EXAM: TUNNEL POWER PORT PLACEMENT WITH SUBCUTANEOUS  POCKET UTILIZING ULTRASOUND & FLOUROSCOPY FLUOROSCOPY TIME:  24 seconds MEDICATIONS AND MEDICAL HISTORY: Versed 3.5 mg, Fentanyl 100 mcg. Additional Medications: Clindamycin 600 mg, given within 1 hour of the procedure. Nursing monitored the patient during sedation. ANESTHESIA/SEDATION: Moderate sedation time: 56 minutes CONTRAST:  None PROCEDURE: After written informed consent was obtained, patient was placed in the supine position on angiographic table. The right neck and chest was prepped and draped in a sterile fashion. Lidocaine was utilized for local anesthesia. The left jugular vein was noted to be patent initially with ultrasound. Under sonographic guidance, a micropuncture needle was inserted into the right IJ vein (Ultrasound and fluoroscopic image documentation was performed). The needle was removed over an 018 wire which was exchanged for a Amplatz. This was advanced into the IVC. An 8-French dilator was advanced over the Amplatz. A small incision was made in the left upper chest  over the anterior right second rib. Utilizing blunt dissection, a subcutaneous pocket was created in the caudal direction. The pocket was irrigated with a copious amount of sterile normal saline. The port catheter was tunneled from the chest incision, and out the neck incision. The reservoir was inserted into the subcutaneous pocket and secured with two 3-0 Ethilon stitches. A peel-away sheath was advanced over the Amplatz wire. The port catheter was cut to measure length and inserted through the peel-away sheath. The peel-away sheath was removed. The chest incision was closed with 3-0 Vicryl interrupted stitches for the subcutaneous tissue and a running of 4-0 Vicryl subcuticular stitch for the skin. The neck incision was closed with a 4-0 Vicryl subcuticular stitch. Derma-bond was applied to both surgical incisions. The port reservoir was flushed and instilled with heparinized saline. No complications. FINDINGS: A right IJ  vein Port-A-Cath is in place with its tip at the cavoatrial junction. COMPLICATIONS: None IMPRESSION: Successful 8 French left internal jugular vein power port placement with its tip at the SVC/RA junction. Electronically Signed   By: Marybelle Killings M.D.   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 Pablo Ledger in December 2016 for right middle lobe nodule biopsy proven to be squamous cell carcinoma with negative lymph nodes (biopsy negative) resulting in Stage IA disease initially.  Now with progressive/recurrence/relapse of disease with hypermetabolic subcarinal and right infrahilar lymphadenopathy of PET imaging resulting in concurrent chemoXRT.  Oncology history developed.  Staging in [facility] problem list completed and updated.  Pre-treatment labs as ordered: CBC diff, CMET.  Weight is stable.   Return in 1 week for follow-up and next cycle of chemotherapy pending results of pre-treatment labs.   THERAPY PLAN:  Continue concomitant chemoXRT as outlined above.  All questions were answered. The patient knows to call the clinic with any problems, questions or concerns. We can certainly see the patient much sooner if necessary.  Patient and plan discussed with Dr. Ancil Linsey and she is in agreement with the aforementioned.   This note is electronically signed by: Doy Mince 12/13/2015 2:51 PM

## 2015-12-12 NOTE — Assessment & Plan Note (Addendum)
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 Pablo Ledger in December 2016 for right middle lobe nodule biopsy proven to be squamous cell carcinoma with negative lymph nodes (biopsy negative) resulting in Stage IA disease initially.  Now with progressive/recurrence/relapse of disease with hypermetabolic subcarinal and right infrahilar lymphadenopathy of PET imaging resulting in concurrent chemoXRT.  Oncology history developed.  Staging in [facility] problem list completed and updated.  Pre-treatment labs as ordered: CBC diff, CMET.  Weight is stable.   Return in 1 week for follow-up and next cycle of chemotherapy pending results of pre-treatment labs.

## 2015-12-13 ENCOUNTER — Ambulatory Visit
Admission: RE | Admit: 2015-12-13 | Discharge: 2015-12-13 | Disposition: A | Discharge status: Home / Self Care | Payer: PPO | Source: Ambulatory Visit | Attending: Radiation Oncology | Admitting: Radiation Oncology

## 2015-12-13 ENCOUNTER — Encounter (HOSPITAL_BASED_OUTPATIENT_CLINIC_OR_DEPARTMENT_OTHER): Payer: PPO

## 2015-12-13 ENCOUNTER — Encounter (HOSPITAL_BASED_OUTPATIENT_CLINIC_OR_DEPARTMENT_OTHER): Payer: PPO | Admitting: Oncology

## 2015-12-13 ENCOUNTER — Encounter (HOSPITAL_COMMUNITY): Payer: Self-pay | Admitting: Oncology

## 2015-12-13 VITALS — BP 159/70 | HR 75 | Temp 97.6°F | Resp 18 | Wt 168.0 lb

## 2015-12-13 VITALS — BP 147/74 | HR 77 | Temp 97.4°F | Resp 18

## 2015-12-13 DIAGNOSIS — C50911 Malignant neoplasm of unspecified site of right female breast: Secondary | ICD-10-CM

## 2015-12-13 DIAGNOSIS — C342 Malignant neoplasm of middle lobe, bronchus or lung: Secondary | ICD-10-CM

## 2015-12-13 DIAGNOSIS — Z5111 Encounter for antineoplastic chemotherapy: Secondary | ICD-10-CM | POA: Diagnosis not present

## 2015-12-13 DIAGNOSIS — Z510 Encounter for antineoplastic radiation therapy: Secondary | ICD-10-CM | POA: Diagnosis not present

## 2015-12-13 LAB — COMPREHENSIVE METABOLIC PANEL
ALT: 21 U/L (ref 14–54)
ANION GAP: 6 (ref 5–15)
AST: 25 U/L (ref 15–41)
Albumin: 3.5 g/dL (ref 3.5–5.0)
Alkaline Phosphatase: 60 U/L (ref 38–126)
BUN: 15 mg/dL (ref 6–20)
CHLORIDE: 107 mmol/L (ref 101–111)
CO2: 27 mmol/L (ref 22–32)
Calcium: 9.5 mg/dL (ref 8.9–10.3)
Creatinine, Ser: 0.62 mg/dL (ref 0.44–1.00)
GFR calc Af Amer: >60 mL/min (ref >60)
GFR calc non Af Amer: >60 mL/min (ref >60)
GLUCOSE: 130 mg/dL — AB (ref 65–99)
POTASSIUM: 3.8 mmol/L (ref 3.5–5.1)
Sodium: 140 mmol/L (ref 135–145)
TOTAL PROTEIN: 5.9 g/dL — AB (ref 6.5–8.1)
Total Bilirubin: 0.6 mg/dL (ref 0.3–1.2)

## 2015-12-13 LAB — CBC WITH DIFFERENTIAL/PLATELET
BASOS ABS: 0.1 10*3/uL (ref 0.0–0.1)
Basophils Relative: 2 %
Eosinophils Absolute: 0.1 10*3/uL (ref 0.0–0.7)
Eosinophils Relative: 3 %
HEMATOCRIT: 36.1 % (ref 36.0–46.0)
Hemoglobin: 11.8 g/dL — ABNORMAL LOW (ref 12.0–15.0)
LYMPHS ABS: 0.5 10*3/uL — AB (ref 0.7–4.0)
LYMPHS PCT: 17 %
MCH: 29.2 pg (ref 26.0–34.0)
MCHC: 32.7 g/dL (ref 30.0–36.0)
MCV: 89.4 fL (ref 78.0–100.0)
MONO ABS: 0.3 10*3/uL (ref 0.1–1.0)
MONOS PCT: 9 %
NEUTROS ABS: 2.1 10*3/uL (ref 1.7–7.7)
Neutrophils Relative %: 69 %
Platelets: 120 10*3/uL — ABNORMAL LOW (ref 150–400)
RBC: 4.04 MIL/uL (ref 3.87–5.11)
RDW: 13.6 % (ref 11.5–15.5)
WBC: 3.1 10*3/uL — ABNORMAL LOW (ref 4.0–10.5)

## 2015-12-13 MED ORDER — DIPHENHYDRAMINE HCL 50 MG/ML IJ SOLN
INTRAMUSCULAR | Status: AC
  Filled 2015-12-13: qty 1

## 2015-12-13 MED ORDER — DEXAMETHASONE SODIUM PHOSPHATE 100 MG/10ML IJ SOLN
20.0000 mg | Freq: Once | INTRAMUSCULAR | Status: AC
Start: 2015-12-13 — End: 2015-12-13
  Administered 2015-12-13: 20 mg via INTRAVENOUS
  Filled 2015-12-13: qty 2

## 2015-12-13 MED ORDER — PALONOSETRON HCL INJECTION 0.25 MG/5ML
INTRAVENOUS | Status: AC
  Filled 2015-12-13: qty 5

## 2015-12-13 MED ORDER — SODIUM CHLORIDE 0.9% FLUSH
10.0000 mL | INTRAVENOUS | Status: DC | PRN
  Administered 2015-12-13: 10 mL
  Filled 2015-12-13: qty 10

## 2015-12-13 MED ORDER — FAMOTIDINE IN NACL 20-0.9 MG/50ML-% IV SOLN
20.0000 mg | Freq: Once | INTRAVENOUS | Status: AC
Start: 2015-12-13 — End: 2015-12-13
  Administered 2015-12-13: 20 mg via INTRAVENOUS

## 2015-12-13 MED ORDER — DIPHENHYDRAMINE HCL 50 MG/ML IJ SOLN
50.0000 mg | Freq: Once | INTRAMUSCULAR | Status: AC
  Administered 2015-12-13: 50 mg via INTRAVENOUS

## 2015-12-13 MED ORDER — HEPARIN SOD (PORK) LOCK FLUSH 100 UNIT/ML IV SOLN
500.0000 [IU] | Freq: Once | INTRAVENOUS | Status: AC | PRN
  Administered 2015-12-13: 500 [IU]
  Filled 2015-12-13: qty 5

## 2015-12-13 MED ORDER — FAMOTIDINE IN NACL 20-0.9 MG/50ML-% IV SOLN
INTRAVENOUS | Status: AC
  Filled 2015-12-13: qty 50

## 2015-12-13 MED ORDER — PALONOSETRON HCL INJECTION 0.25 MG/5ML
0.2500 mg | Freq: Once | INTRAVENOUS | Status: AC
Start: 2015-12-13 — End: 2015-12-13
  Administered 2015-12-13: 0.25 mg via INTRAVENOUS

## 2015-12-13 MED ORDER — SODIUM CHLORIDE 0.9 % IV SOLN
164.2000 mg | Freq: Once | INTRAVENOUS | Status: AC
  Administered 2015-12-13: 160 mg via INTRAVENOUS
  Filled 2015-12-13: qty 16

## 2015-12-13 MED ORDER — SODIUM CHLORIDE 0.9 % IV SOLN
Freq: Once | INTRAVENOUS | Status: AC
  Administered 2015-12-13: 09:00:00 via INTRAVENOUS

## 2015-12-13 MED ORDER — PACLITAXEL CHEMO INJECTION 300 MG/50ML
45.0000 mg/m2 | Freq: Once | INTRAVENOUS | Status: AC
  Administered 2015-12-13: 84 mg via INTRAVENOUS
  Filled 2015-12-13: qty 14

## 2015-12-13 NOTE — Patient Instructions (Signed)
[facility] Discharge Instructions for Patients Receiving Chemotherapy   Beginning January 23rd 2017 lab work for the [facility] will be done in the  Main lab at [facility] on 1st floor. If you have a lab appointment with the [facility] please come in thru the  Main Entrance and check in at the main information desk   Today you received the following chemotherapy agents weekly Taxol and Carbo.  To help prevent nausea and vomiting after your treatment, we encourage you to take your nausea medication as instructed.  If you develop nausea and vomiting, or diarrhea that is not controlled by your medication, call the clinic.  The clinic phone number is [phone]. Office hours are Monday-Friday 8:30am-5:00pm.  BELOW ARE SYMPTOMS THAT SHOULD BE REPORTED IMMEDIATELY:  *FEVER GREATER THAN 101.0 F  *CHILLS WITH OR WITHOUT FEVER  NAUSEA AND VOMITING THAT IS NOT CONTROLLED WITH YOUR NAUSEA MEDICATION  *UNUSUAL SHORTNESS OF BREATH  *UNUSUAL BRUISING OR BLEEDING  TENDERNESS IN MOUTH AND THROAT WITH OR WITHOUT PRESENCE OF ULCERS  *URINARY PROBLEMS  *BOWEL PROBLEMS  UNUSUAL RASH Items with * indicate a potential emergency and should be followed up as soon as possible. If you have an emergency after office hours please contact your primary care physician or go to the nearest emergency department.  Please call the clinic during office hours if you have any questions or concerns.   You may also contact the Patient Navigator at [phone] should you have any questions or need assistance in obtaining follow up care.  Resources For Cancer Patients and their Caregivers ? American Cancer Society: Can assist with transportation, wigs, general needs, runs Look Good Feel Better.        605-593-4844 ? Cancer Care: Provides financial assistance, online support groups, medication/co-pay assistance.  1-800-813-HOPE (959)196-7069) ? [place] Assists  [place] cancer patients and their families through emotional , educational and financial support.  (276)871-2615 ? [place] DSS Where to apply for food stamps, Medicaid and utility assistance. 763-154-2559 ? [facility]: Transportation to medical appointments. 929-159-3105 ? Social Security Administration: May apply for disability if have a Stage IV cancer. 770 152 3678 [phone]) [phone] ? [employer], Disability and Transit Services: Assists with nutrition, care and transit needs. 313-220-1894

## 2015-12-13 NOTE — Patient Instructions (Signed)
[place] at [facility] Discharge Instructions  RECOMMENDATIONS MADE BY THE CONSULTANT AND ANY TEST RESULTS WILL BE SENT TO YOUR REFERRING PHYSICIAN.  Exam done and seen today by Kirby Crigler Your half way thru, and when you stop radiation we will stop chemo. Will do scans about 6-8 weeks after you stop treatment. Return to see the Doctor in one week. Call the clinic for any concerns or questions.    Thank you for choosing [place] at [facility] to provide your oncology and hematology care.  To afford each patient quality time with our provider, please arrive at least 15 minutes before your scheduled appointment time.   Beginning January 23rd 2017 lab work for the [facility] will be done in the  Main lab at [facility] on 1st floor. If you have a lab appointment with the [facility] please come in thru the  Main Entrance and check in at the main information desk  You need to re-schedule your appointment should you arrive 10 or more minutes late.  We strive to give you quality time with our providers, and arriving late affects you and other patients whose appointments are after yours.  Also, if you no show three or more times for appointments you may be dismissed from the clinic at the providers discretion.     Again, thank you for choosing [facility].  Our hope is that these requests will decrease the amount of time that you wait before being seen by our physicians.       _____________________________________________________________  Should you have questions after your visit to [facility], please contact our office at [phone] between the hours of 8:30 a.m. and 4:30 p.m.  Voicemails left after 4:30 p.m. will not be returned until the following business day.  For prescription refill requests, have your pharmacy contact our office.         Resources For Cancer Patients and their  Caregivers ? American Cancer Society: Can assist with transportation, wigs, general needs, runs Look Good Feel Better.        [phone] ? Cancer Care: Provides financial assistance, online support groups, medication/co-pay assistance.  1-800-813-HOPE (614) 723-4458) ? [place] Assists [place] cancer patients and their families through emotional , educational and financial support.  7181628933 ? [place] DSS Where to apply for food stamps, Medicaid and utility assistance. (951) 356-6482 ? [facility]: Transportation to medical appointments. [phone] ? Social Security Administration: May apply for disability if have a Stage IV cancer. 253-497-8816 [phone] ? [employer], Disability and Transit Services: Assists with nutrition, care and transit needs. 5708573073

## 2015-12-13 NOTE — Progress Notes (Signed)
Tolerated chemo well. Ambulatory on discharge home with daughter.

## 2015-12-14 ENCOUNTER — Ambulatory Visit: Admission: RE | Admit: 2015-12-14 | Payer: PPO | Source: Ambulatory Visit | Admitting: Radiation Oncology

## 2015-12-14 ENCOUNTER — Ambulatory Visit
Admission: RE | Admit: 2015-12-14 | Discharge: 2015-12-14 | Disposition: A | Discharge status: Home / Self Care | Payer: PPO | Source: Ambulatory Visit | Attending: Radiation Oncology | Admitting: Radiation Oncology

## 2015-12-14 DIAGNOSIS — C342 Malignant neoplasm of middle lobe, bronchus or lung: Secondary | ICD-10-CM | POA: Diagnosis not present

## 2015-12-14 DIAGNOSIS — Z510 Encounter for antineoplastic radiation therapy: Secondary | ICD-10-CM | POA: Diagnosis not present

## 2015-12-15 ENCOUNTER — Ambulatory Visit
Admission: RE | Admit: 2015-12-15 | Discharge: 2015-12-15 | Disposition: A | Discharge status: Home / Self Care | Payer: PPO | Source: Ambulatory Visit | Attending: Radiation Oncology | Admitting: Radiation Oncology

## 2015-12-15 ENCOUNTER — Encounter: Payer: Self-pay | Admitting: Adult Health

## 2015-12-15 ENCOUNTER — Encounter: Payer: Self-pay | Admitting: Radiation Oncology

## 2015-12-15 VITALS — BP 134/72 | HR 80 | Temp 97.7°F | Resp 16 | Ht 63.0 in | Wt 168.6 lb

## 2015-12-15 DIAGNOSIS — C50911 Malignant neoplasm of unspecified site of right female breast: Secondary | ICD-10-CM

## 2015-12-15 DIAGNOSIS — Z510 Encounter for antineoplastic radiation therapy: Secondary | ICD-10-CM | POA: Diagnosis not present

## 2015-12-15 DIAGNOSIS — C342 Malignant neoplasm of middle lobe, bronchus or lung: Secondary | ICD-10-CM | POA: Diagnosis not present

## 2015-12-15 NOTE — Progress Notes (Signed)
Weekly Management Note Current Dose: 36 Gy  Projected Dose: 60 Gy   Narrative:  The patient presents for routine under treatment assessment.  CBCT/MVCT images/Port film x-rays were reviewed.  The chart was checked. Kari Bullock has received 18 fractions to her right lung. Skin to right chest looks normal pink. Using Sonafine twice a day. Appetite is good some food does not have much taste. Energy level is good. Denies pain. No breathing problems or coughing problems. Her only complaint is a burning feeling after taking the medication that accompanies chemotherapy, most likely a steroid.  Physical Findings: No skin changes.   Vitals:  Filed Vitals:   12/15/15 1353  BP: 134/72  Pulse: 80  Temp: 97.7 F (36.5 C)  Resp: 16   Weight:  Wt Readings from Last 3 Encounters:  12/15/15 168 lb 9.6 oz (76.476 kg)  12/13/15 168 lb (76.204 kg)  12/07/15 169 lb 1.6 oz (76.703 kg)   Lab Results  Component Value Date   WBC 3.1* 12/13/2015   HGB 11.8* 12/13/2015   HCT 36.1 12/13/2015   MCV 89.4 12/13/2015   PLT 120* 12/13/2015   Lab Results  Component Value Date   CREATININE 0.62 12/13/2015   BUN 15 12/13/2015   NA 140 12/13/2015   K 3.8 12/13/2015   CL 107 12/13/2015   CO2 27 12/13/2015     Impression:  The patient is tolerating radiation.  Plan:  Continue treatment as planned. Monitor facial flushing. Encouraged the patient to try taking prilosec or protonix to lessen the side effects after taking her steroids. She was introduced to survivorship today.   ------------------------------------------------  Thea Silversmith, MD  This document serves as a record of services personally performed by Thea Silversmith, MD. It was created on her behalf by Arlyce Harman, a trained medical scribe. The creation of this record is based on the scribe's personal observations and the provider's statements to them. This document has been checked and approved by the attending provider.

## 2015-12-15 NOTE — Progress Notes (Addendum)
Kari Bullock has received 18 fractions to her right lung.  Skin to right chest looks normal pink.  Using Sonafine twice a day.  Appetite is good some food does not have mucg taste.  Energy level is good.  Denies pain.  No breathing problems or coughing problems. BP 134/72 mmHg  Pulse 80  Temp(Src) 97.7 F (36.5 C) (Oral)  Resp 16  Ht '5\' 3"'$  (1.6 m)  Wt 168 lb 9.6 oz (76.476 kg)  BMI 29.87 kg/m2  SpO2 99%

## 2015-12-15 NOTE — Progress Notes (Signed)
I briefly met Ms. Kari Bullock after her weekly under treat visit for lung cancer with Dr. Pablo Ledger.  I gave her a copy of the "Life After Cancer for Every Survivor" booklet, as well as my business card.  Kari Bullock lives in [place] and is a patient of Dr. Donald Pore.  I will plan to see Ms. Kari Bullock at [facility] for a survivorship care plan visit after she completes radiation here in [place].  I encouraged her to call me with any questions. I look forward to participating in her care!  Mike Craze, NP [place] [phone]

## 2015-12-16 ENCOUNTER — Ambulatory Visit
Admission: RE | Admit: 2015-12-16 | Discharge: 2015-12-16 | Disposition: A | Discharge status: Home / Self Care | Payer: PPO | Source: Ambulatory Visit | Attending: Radiation Oncology | Admitting: Radiation Oncology

## 2015-12-16 DIAGNOSIS — Z510 Encounter for antineoplastic radiation therapy: Secondary | ICD-10-CM | POA: Diagnosis not present

## 2015-12-16 DIAGNOSIS — C342 Malignant neoplasm of middle lobe, bronchus or lung: Secondary | ICD-10-CM | POA: Diagnosis not present

## 2015-12-17 ENCOUNTER — Ambulatory Visit
Admission: RE | Admit: 2015-12-17 | Discharge: 2015-12-17 | Disposition: A | Discharge status: Home / Self Care | Payer: PPO | Source: Ambulatory Visit | Attending: Radiation Oncology | Admitting: Radiation Oncology

## 2015-12-17 DIAGNOSIS — C342 Malignant neoplasm of middle lobe, bronchus or lung: Secondary | ICD-10-CM | POA: Diagnosis not present

## 2015-12-17 DIAGNOSIS — Z510 Encounter for antineoplastic radiation therapy: Secondary | ICD-10-CM | POA: Diagnosis not present

## 2015-12-19 ENCOUNTER — Other Ambulatory Visit: Payer: Self-pay | Admitting: Neurology

## 2015-12-20 ENCOUNTER — Encounter (HOSPITAL_COMMUNITY): Payer: Self-pay | Admitting: Hematology & Oncology

## 2015-12-20 ENCOUNTER — Ambulatory Visit
Admission: RE | Admit: 2015-12-20 | Discharge: 2015-12-20 | Disposition: A | Discharge status: Home / Self Care | Payer: PPO | Source: Ambulatory Visit | Attending: Radiation Oncology | Admitting: Radiation Oncology

## 2015-12-20 ENCOUNTER — Encounter (HOSPITAL_COMMUNITY): Payer: PPO | Attending: Hematology & Oncology

## 2015-12-20 ENCOUNTER — Encounter (HOSPITAL_BASED_OUTPATIENT_CLINIC_OR_DEPARTMENT_OTHER): Payer: PPO | Admitting: Hematology & Oncology

## 2015-12-20 VITALS — BP 139/77 | HR 78 | Temp 98.2°F | Resp 16 | Wt 164.8 lb

## 2015-12-20 DIAGNOSIS — C342 Malignant neoplasm of middle lobe, bronchus or lung: Secondary | ICD-10-CM

## 2015-12-20 DIAGNOSIS — Z9889 Other specified postprocedural states: Secondary | ICD-10-CM | POA: Insufficient documentation

## 2015-12-20 DIAGNOSIS — Z510 Encounter for antineoplastic radiation therapy: Secondary | ICD-10-CM | POA: Diagnosis not present

## 2015-12-20 DIAGNOSIS — K219 Gastro-esophageal reflux disease without esophagitis: Secondary | ICD-10-CM | POA: Insufficient documentation

## 2015-12-20 DIAGNOSIS — Z5111 Encounter for antineoplastic chemotherapy: Secondary | ICD-10-CM

## 2015-12-20 DIAGNOSIS — Z853 Personal history of malignant neoplasm of breast: Secondary | ICD-10-CM | POA: Diagnosis not present

## 2015-12-20 DIAGNOSIS — Z90710 Acquired absence of both cervix and uterus: Secondary | ICD-10-CM | POA: Insufficient documentation

## 2015-12-20 DIAGNOSIS — E7800 Pure hypercholesterolemia, unspecified: Secondary | ICD-10-CM | POA: Insufficient documentation

## 2015-12-20 DIAGNOSIS — C50911 Malignant neoplasm of unspecified site of right female breast: Secondary | ICD-10-CM

## 2015-12-20 DIAGNOSIS — I10 Essential (primary) hypertension: Secondary | ICD-10-CM | POA: Insufficient documentation

## 2015-12-20 DIAGNOSIS — Z885 Allergy status to narcotic agent status: Secondary | ICD-10-CM | POA: Diagnosis not present

## 2015-12-20 LAB — CBC WITH DIFFERENTIAL/PLATELET
Basophils Absolute: 0.1 10*3/uL (ref 0.0–0.1)
Basophils Relative: 2 %
EOS PCT: 2 %
Eosinophils Absolute: 0.1 10*3/uL (ref 0.0–0.7)
HEMATOCRIT: 37.1 % (ref 36.0–46.0)
Hemoglobin: 12.2 g/dL (ref 12.0–15.0)
LYMPHS PCT: 16 %
Lymphs Abs: 0.5 10*3/uL — ABNORMAL LOW (ref 0.7–4.0)
MCH: 29.2 pg (ref 26.0–34.0)
MCHC: 32.9 g/dL (ref 30.0–36.0)
MCV: 88.8 fL (ref 78.0–100.0)
MONO ABS: 0.3 10*3/uL (ref 0.1–1.0)
MONOS PCT: 9 %
NEUTROS ABS: 2.4 10*3/uL (ref 1.7–7.7)
Neutrophils Relative %: 72 %
Platelets: 108 10*3/uL — ABNORMAL LOW (ref 150–400)
RBC: 4.18 MIL/uL (ref 3.87–5.11)
RDW: 14.2 % (ref 11.5–15.5)
WBC: 3.4 10*3/uL — ABNORMAL LOW (ref 4.0–10.5)

## 2015-12-20 LAB — COMPREHENSIVE METABOLIC PANEL
ALBUMIN: 3.7 g/dL (ref 3.5–5.0)
ALT: 19 U/L (ref 14–54)
ANION GAP: 6 (ref 5–15)
AST: 22 U/L (ref 15–41)
Alkaline Phosphatase: 53 U/L (ref 38–126)
BILIRUBIN TOTAL: 0.6 mg/dL (ref 0.3–1.2)
BUN: 12 mg/dL (ref 6–20)
CHLORIDE: 107 mmol/L (ref 101–111)
CO2: 26 mmol/L (ref 22–32)
Calcium: 9.6 mg/dL (ref 8.9–10.3)
Creatinine, Ser: 0.61 mg/dL (ref 0.44–1.00)
GFR calc Af Amer: >60 mL/min (ref >60)
GFR calc non Af Amer: >60 mL/min (ref >60)
GLUCOSE: 125 mg/dL — AB (ref 65–99)
POTASSIUM: 3.6 mmol/L (ref 3.5–5.1)
SODIUM: 139 mmol/L (ref 135–145)
TOTAL PROTEIN: 6.3 g/dL — AB (ref 6.5–8.1)

## 2015-12-20 MED ORDER — FAMOTIDINE IN NACL 20-0.9 MG/50ML-% IV SOLN
INTRAVENOUS | Status: AC
  Filled 2015-12-20: qty 50

## 2015-12-20 MED ORDER — SODIUM CHLORIDE 0.9% FLUSH
10.0000 mL | INTRAVENOUS | Status: DC | PRN
  Administered 2015-12-20: 10 mL
  Filled 2015-12-20: qty 10

## 2015-12-20 MED ORDER — SODIUM CHLORIDE 0.9 % IV SOLN
Freq: Once | INTRAVENOUS | Status: AC
  Administered 2015-12-20: 09:00:00 via INTRAVENOUS

## 2015-12-20 MED ORDER — SODIUM CHLORIDE 0.9 % IV SOLN
20.0000 mg | Freq: Once | INTRAVENOUS | Status: AC
  Administered 2015-12-20: 20 mg via INTRAVENOUS
  Filled 2015-12-20: qty 2

## 2015-12-20 MED ORDER — DIPHENHYDRAMINE HCL 50 MG/ML IJ SOLN
INTRAMUSCULAR | Status: AC
  Filled 2015-12-20: qty 1

## 2015-12-20 MED ORDER — PALONOSETRON HCL INJECTION 0.25 MG/5ML
0.2500 mg | Freq: Once | INTRAVENOUS | Status: AC
  Administered 2015-12-20: 0.25 mg via INTRAVENOUS

## 2015-12-20 MED ORDER — FAMOTIDINE IN NACL 20-0.9 MG/50ML-% IV SOLN
20.0000 mg | Freq: Once | INTRAVENOUS | Status: AC
  Administered 2015-12-20: 20 mg via INTRAVENOUS

## 2015-12-20 MED ORDER — DEXTROSE 5 % IV SOLN
45.0000 mg/m2 | Freq: Once | INTRAVENOUS | Status: AC
  Administered 2015-12-20: 84 mg via INTRAVENOUS
  Filled 2015-12-20: qty 14

## 2015-12-20 MED ORDER — HEPARIN SOD (PORK) LOCK FLUSH 100 UNIT/ML IV SOLN
500.0000 [IU] | Freq: Once | INTRAVENOUS | Status: AC | PRN
  Administered 2015-12-20: 500 [IU]
  Filled 2015-12-20: qty 5

## 2015-12-20 MED ORDER — DIPHENHYDRAMINE HCL 50 MG/ML IJ SOLN
50.0000 mg | Freq: Once | INTRAMUSCULAR | Status: AC
  Administered 2015-12-20: 50 mg via INTRAVENOUS

## 2015-12-20 MED ORDER — SODIUM CHLORIDE 0.9 % IV SOLN
164.2000 mg | Freq: Once | INTRAVENOUS | Status: AC
  Administered 2015-12-20: 160 mg via INTRAVENOUS
  Filled 2015-12-20: qty 16

## 2015-12-20 MED ORDER — PALONOSETRON HCL INJECTION 0.25 MG/5ML
INTRAVENOUS | Status: AC
  Filled 2015-12-20: qty 5

## 2015-12-20 NOTE — Progress Notes (Signed)
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 ER +90%, PR 30% Her -2 neu indeterminate. Surgery, XRT, hormonal therapy. Intolerant of Tamoxifen, arimidex X 5 years completed 02/15/2005 CT abdomen 12/18/2014 6 mm RML pulmonary nodule, 17 mm nodule in LL Base was 60m previously PET/CT 7 mm RML nodule hypermetabolic, hypermetabolic activity along the posterior inferior margin of the R mainstem bronchus, non-enlarged subcarinal LN EBUS 01/19/2015 with Dr. VPrescott Gum Lung, core biopsy RML on 07/15/2015 biopsy positive for poorly differentiated carcinoma, staining pattern favors poorly differentiated squamous cell carcinoma.  Wang needle FNA 7 Node #2, no malignant cells PET imaging with R infrahilar and subcarinal adenopathy   CURRENT THERAPY:  Concurrent chemo/xrt  INTERVAL HISTORY: Kari DUDZIK75y.[id number]. female returns today for additional f/u of a locally advanced NSCLC.  [facility] the right breast, grade 2 (T1 A., N1 a) with 2 of 4 positive lymph nodes, ER +90%, PR +30%, lymph node metastases were 1 mm or slightly less. HER-2/neu was indeterminant at 2+. She was diagnosed in February 2001 treated with surgery followed by radiation therapy and then hormonal therapy. She could not tolerate tamoxifen so was switched to Arimidex which she took for total 5 years ending on 02/15/2005, no evidence of disease.  She underwent XRT with [name] 09/08/15-09/15/15: for a squamous cell carcinoma of the Right middle lobe/ 54 Gy at 18 GY per fraction x 3 fractions. Unfortunately repeat imaging has shown disease within the mediastinal and infrahilar regions. She has opted for concurrent therapy. She is here today for ongoing chemotherapy.  Mrs. TDurkinis accompanied by  her husband. She is here for Cycle #5 Carboplatin/Paclitaxel today.   She is feeling well. She denies a sore throat and has not experienced any difficulty swallowing.   Her husband states that sometimes when his wife blows her nose, her nose begins bleeding. She does not have a history of nosebleeds and was scared when this symptom started. She now avoids blowing her nose for fear of bleeding. She has no difficulty stopping the bleeding and describes it as fleeting.   Denies chest pain, breathing problems, or bowel issues.    Past Medical History  Diagnosis Date  . Hypertension   . Breast cancer ([place]     2001 RT breat lumpectomy rad tx  . GERD (gastroesophageal reflux disease)   . Memory loss   . Pure hypercholesterolemia   . Memory change 10/24/2013  . Infiltrating ductal carcinoma of right breast ([place] 01/09/2015  . Pulmonary nodule 01/09/2015  . Headache     years ago, none since hysterectomy  . Anemia     in the past  . Diarrhea   . Complication of anesthesia     confusion  . Lung cancer ([facility]     has Memory change; Periumbilical pain; GERD (gastroesophageal reflux disease); Infiltrating ductal carcinoma of right breast ([facility]; Diverticulosis of colon without hemorrhage; Chronic diarrhea; Mucosal abnormality of stomach; and Primary cancer of right middle lobe of lung ([place] on her problem list.     is allergic to codeine; morphine and related; adhesive; sulfur; and tamoxifen.  Ms. TMoosmandoes not currently have medications on file.  Past Surgical History  Procedure Laterality Date  . Abdominal hysterectomy    .  Bilateral oophorectomy  2007  . Incision and drainage of wound  2011    buttocks  . Tonsillectomy    . Cataract extraction Bilateral     lens implant  . Carpal tunnel release Right   . Colonoscopy  2011    RMR: 1. Anal papilla, otherwise normal rectum. 2. Left-sided diverticula, single diminutive polyp in the sigmoid segment status post cold biospy removal.  Remainder of the colonic mucosa appeared normal.   . Colonoscopy N/A 01/14/2015    Procedure: COLONOSCOPY;  Surgeon: Daneil Dolin, MD;  Location[facility];  Service: Endoscopy;  Laterality: N/A;  930 - moved to 10:15  . Esophagogastroduodenoscopy N/A 01/14/2015    RMR: Colonic diverticulosis. status post segmental biospy and stool sampling.  No egd done today  . Breast surgery Right     right lumpectomy, lymph glands remove  . Video bronchoscopy with endobronchial ultrasound N/A 01/19/2015    Procedure: VIDEO BRONCHOSCOPY WITH ENDOBRONCHIAL ULTRASOUND;  Surgeon: Ivin Poot, MD;  Location: [facility] OR;  Service: Thoracic;  Laterality: N/A;  . Esophagogastroduodenoscopy N/A 02/11/2015    RMR: Focally abnormal antrum of doubtful clinical significance- Status post biopsy.   . Video bronchoscopy with endobronchial ultrasound N/A 07/27/2015    Procedure: VIDEO BRONCHOSCOPY WITH ENDOBRONCHIAL ULTRASOUND;  Surgeon: Ivin Poot, MD;  Location: [facility] OR;  Service: Thoracic;  Laterality: N/A;    Denies any headaches, dizziness, double vision, fevers, chills, night sweats, nausea, vomiting, diarrhea, constipation, chest pain, heart palpitations, shortness of breath, blood in stool, black tarry stool, urinary pain, urinary burning, urinary frequency, hematuria, appetite loss.   Positive for nosebleeds.     Intermittent nosebleeds after blowing nose. 14 point review of systems was performed and is negative except as detailed under history of present illness and above    PHYSICAL EXAMINATION  ECOG PERFORMANCE STATUS: 1 - Symptomatic but completely ambulatory  Vitals with BMI 12/20/2015  Height   Weight 164 lbs 13 oz  BMI   Systolic 161  Diastolic 70  Pulse 94  Respirations 16   GENERAL:alert, no distress, well nourished, well developed, comfortable, cooperative, obese, smiling and accompanied by her husband. Able to get on examination table with assistance. SKIN: skin color, texture, turgor are  normal, no rashes or significant lesions. HEAD: Normocephalic, No masses, lesions, tenderness or abnormalities EYES: normal, PERRLA, EOMI, Conjunctiva are pink and non-injected EARS: External ears normal OROPHARYNX:lips, buccal mucosa, and tongue normal and mucous membranes are moist  NECK: supple, no adenopathy, thyroid normal size, non-tender, without nodularity, trachea midline LYMPH:  no palpable lymphadenopathy BREAST:not examined LUNGS: clear to auscultation  HEART: regular rate & rhythm, no murmurs, no gallops, S1 normal and S2 normal ABDOMEN:abdomen soft, non-tender, obese and normal bowel sounds BACK: Back symmetric, no curvature., No CVA tenderness EXTREMITIES:less then 2 second capillary refill, no joint deformities, effusion, or inflammation, no skin discoloration, no clubbing, no cyanosis  NEURO: alert & oriented x 3 with fluent speech, no focal motor/sensory deficits, gait normal  LABORATORY DATA: I have reviewed the data as listed. CBC    Component Value Date/Time   WBC 3.4* 12/20/2015 0830   RBC 4.18 12/20/2015 0830   HGB 12.2 12/20/2015 0830   HCT 37.1 12/20/2015 0830   PLT 108* 12/20/2015 0830   MCV 88.8 12/20/2015 0830   MCH 29.2 12/20/2015 0830   MCHC 32.9 12/20/2015 0830   RDW 14.2 12/20/2015 0830   LYMPHSABS 0.5* 12/20/2015 0830   MONOABS 0.3 12/20/2015 0830  EOSABS 0.1 12/20/2015 0830   BASOSABS 0.1 12/20/2015 0830      Chemistry      Component Value Date/Time   NA 140 12/13/2015 0838   K 3.8 12/13/2015 0838   CL 107 12/13/2015 0838   CO2 27 12/13/2015 0838   BUN 15 12/13/2015 0838   CREATININE 0.62 12/13/2015 0838   CREATININE 0.69 12/16/2014 1058      Component Value Date/Time   CALCIUM 9.5 12/13/2015 0838   ALKPHOS 60 12/13/2015 0838   AST 25 12/13/2015 0838   ALT 21 12/13/2015 0838   BILITOT 0.6 12/13/2015 0838     RADIOGRAPHY: I have personally reviewed the radiological images as listed and agreed with the findings in the  report.  Study Result     CLINICAL DATA: 75 year old female with lung cancer, progressive mediastinal lymphadenopathy. Restaging. Subsequent encounter.  EXAM: MRI HEAD WITHOUT AND WITH CONTRAST  TECHNIQUE: Multiplanar, multiecho pulse sequences of the brain and surrounding structures were obtained without and with intravenous contrast.  CONTRAST: 64m MULTIHANCE GADOBENATE DIMEGLUMINE 529 MG/ML IV SOLN  COMPARISON: Brain MRI 03/19/2015  FINDINGS: No midline shift, mass effect, or evidence of intracranial mass lesion. No abnormal enhancement identified. No dural thickening identified.  Major intracranial vascular flow voids are stable. No restricted diffusion to suggest acute infarction. No ventriculomegaly, extra-axial collection or acute intracranial hemorrhage. Cervicomedullary junction and pituitary are within normal limits.  Negative visualized cervical spine and spinal cord. Stable visualized bone marrow signal, no destructive osseous lesion identified.  Stable gray and white matter signal throughout the brain. Mild to moderate nonspecific cerebral white matter T2 and FLAIR hyperintensity is stable. No chronic cerebral blood products or cortical encephalomalacia.  Visible internal auditory structures appear normal. Incidental high riding left internal jugular bulb. Mastoids are clear. Interval resolved paranasal sinus mucosal thickening. Stable postoperative appearance of both globes. Otherwise negative orbit and scalp soft tissues.  IMPRESSION: 1. No acute or metastatic intracranial abnormality. 2. Stable MRI appearance of the brain since 2016.   Electronically Signed  By: HGenevie AnnM.D.  On: 11/23/2015 13:08    Study Result     CLINICAL DATA: Subsequent treatment strategy for right lung cancer (right middle lobe).  EXAM: NUCLEAR MEDICINE PET SKULL BASE TO THIGH  TECHNIQUE: 8.1 mCi F-18 FDG was injected intravenously. Full-ring  PET imaging was performed from the skull base to thigh after the radiotracer. CT data was obtained and used for attenuation correction and anatomic localization.  FASTING BLOOD GLUCOSE: Value: 103 mg/dl  COMPARISON: 07/15/2015  FINDINGS: NECK  No hypermetabolic lymph nodes in the neck.  CHEST  Subcarinal node 2 cm diameter, maximum standard uptake value the 30.5. This is partially continuous with a right infrahilar node measuring 1.2 cm in diameter with maximum standard uptake value 27.5.  In the space previously occupied by the right middle lobe nodule there is only some indistinct architectural distortion/scar-like appearance, without significant residual hypermetabolic activity at the site of the nodule. No significant abnormal hypermetabolic activity associated with the 7 mm lingular nodule currently observed. The density along the lower lingula is probably atelectasis adjacent to epicardial adipose tissue and not hypermetabolic.  ABDOMEN/PELVIS  Gastric antrum activity is likely physiologic. Similarly there is physiologic activity in the ascending colon. Sigmoid diverticulosis. No findings of active malignancy in the abdomen or pelvis.  SKELETON  No focal hypermetabolic activity to suggest skeletal metastasis. Degenerative glenohumeral arthropathy along with cervical spondylosis. Mid to lower thoracic spondylosis and lumbar spondylosis.  IMPRESSION: 1. Prominent  and hypermetabolic subcarinal and right infrahilar adenopathy compatible with malignancy, both the size and metabolic activity of these lymph nodes is significantly increased from April 2016. 2. There is some scarring in the vicinity of the original right middle lobe nodule, but without residual hypermetabolic activity in this vicinity. 3. 7 mm lingular nodule does not currently demonstrate hypermetabolic activity, but is below sensitive PET-CT size thresholds. No change in the size of  this nodule from 12/31/2014.   Electronically Signed  By: Van Clines M.D.  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 Pablo Ledger in December 2016 for right middle lobe nodule biopsy proven to be squamous cell carcinoma with negative lymph nodes (biopsy negative) resulting in Stage IA disease initially.  Now with progressive/recurrence/relapse of disease with hypermetabolic subcarinal and right infrahilar lymphadenopathy of PET imaging resulting in concurrent chemoXRT.  Continue concurrent chemo/xrt. She is tolerating therapy very well.   Pre-treatment labs as ordered: CBC diff, CMET.  Weight is stable. She denies any pain, nausea.  Return in 1 week for follow-up and next cycle of chemotherapy pending results of pre-treatment labs. She will finish XRT next week. Next week will be her final cycle of chemotherapy.    All questions were answered. The patient knows to call the clinic with any problems, questions or concerns. We can certainly see the patient much sooner if necessary.  This document serves as a record of services personally performed by Ancil Linsey, MD. It was created on her behalf by Arlyce Harman, a trained medical scribe. The creation of this  record is based on the scribe's personal observations and the provider's statements to them. This document has been checked and approved by the attending provider.  I have reviewed the above documentation for accuracy and completeness, and I agree with the above. Molli Hazard, MD

## 2015-12-20 NOTE — Patient Instructions (Signed)
[facility] Discharge Instructions for Patients Receiving Chemotherapy   Beginning January 23rd 2017 lab work for the [facility] will be done in the  Main lab at [facility] on 1st floor. If you have a lab appointment with the [place] please come in thru the  Main Entrance and check in at the main information desk   Today you received the following chemotherapy agents weekly Taxol and Carbo.  To help prevent nausea and vomiting after your treatment, we encourage you to take your nausea medication as instructed.  If you develop nausea and vomiting, or diarrhea that is not controlled by your medication, call the clinic.  The clinic phone number is [phone]. Office hours are Monday-Friday 8:30am-5:00pm.  BELOW ARE SYMPTOMS THAT SHOULD BE REPORTED IMMEDIATELY:  *FEVER GREATER THAN 101.0 F  *CHILLS WITH OR WITHOUT FEVER  NAUSEA AND VOMITING THAT IS NOT CONTROLLED WITH YOUR NAUSEA MEDICATION  *UNUSUAL SHORTNESS OF BREATH  *UNUSUAL BRUISING OR BLEEDING  TENDERNESS IN MOUTH AND THROAT WITH OR WITHOUT PRESENCE OF ULCERS  *URINARY PROBLEMS  *BOWEL PROBLEMS  UNUSUAL RASH Items with * indicate a potential emergency and should be followed up as soon as possible. If you have an emergency after office hours please contact your primary care physician or go to the nearest emergency department.  Please call the clinic during office hours if you have any questions or concerns.   You may also contact the Patient Navigator at [phone] should you have any questions or need assistance in obtaining follow up care.  Resources For Cancer Patients and their Caregivers ? American Cancer Society: Can assist with transportation, wigs, general needs, runs Look Good Feel Better.        440-778-8022 ? Cancer Care: Provides financial assistance, online support groups, medication/co-pay assistance.  1-800-813-HOPE 313-626-5219) ? [facility] Assists  [place] cancer patients and their families through emotional , educational and financial support.  959-571-3522 ? [place] DSS Where to apply for food stamps, Medicaid and utility assistance. 2344608306 ? RCATS: Transportation to medical appointments. 516-440-0882 ? Social Security Administration: May apply for disability if have a Stage IV cancer. 779-320-7154 762-824-4033 ? [employer], Disability and Transit Services: Assists with nutrition, care and transit needs. [phone]

## 2015-12-20 NOTE — Assessment & Plan Note (Signed)
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 Pablo Ledger in December 2016 for right middle lobe nodule biopsy proven to be squamous cell carcinoma with negative lymph nodes (biopsy negative) resulting in Stage IA disease initially.  Now with progressive/recurrence/relapse of disease with hypermetabolic subcarinal and right infrahilar lymphadenopathy of PET imaging resulting in concurrent chemoXRT.  Continue concurrent chemo/xrt. She is tolerating therapy very well.   Pre-treatment labs as ordered: CBC diff, CMET.  Weight is stable. She denies any pain, nausea.  Return in 1 week for follow-up and next cycle of chemotherapy pending results of pre-treatment labs. She will finish XRT next week. Next week will be her final cycle of chemotherapy.

## 2015-12-20 NOTE — Progress Notes (Signed)
Tolerated chemo well. Ambulatory on discharge home with husband.

## 2015-12-20 NOTE — Patient Instructions (Addendum)
[place] at [facility] Discharge Instructions  RECOMMENDATIONS MADE BY THE CONSULTANT AND ANY TEST RESULTS WILL BE SENT TO YOUR REFERRING PHYSICIAN.   Exam and discussion by Dr Whitney Muse today Labs are still pending, chemotherapy today Chemotherapy in 1 week. Return to see the doctor in 1 week  Please call the clinic if you have any questions or concerns    Thank you for choosing [place] at [facility] to provide your oncology and hematology care.  To afford each patient quality time with our provider, please arrive at least 15 minutes before your scheduled appointment time.   Beginning January 23rd 2017 lab work for the [facility] will be done in the  Main lab at [facility] on 1st floor. If you have a lab appointment with the [facility] please come in thru the  Main Entrance and check in at the main information desk  You need to re-schedule your appointment should you arrive 10 or more minutes late.  We strive to give you quality time with our providers, and arriving late affects you and other patients whose appointments are after yours.  Also, if you no show three or more times for appointments you may be dismissed from the clinic at the providers discretion.     Again, thank you for choosing [facility].  Our hope is that these requests will decrease the amount of time that you wait before being seen by our physicians.       _____________________________________________________________  Should you have questions after your visit to [facility], please contact our office at [phone] between the hours of 8:30 a.m. and 4:30 p.m.  Voicemails left after 4:30 p.m. will not be returned until the following business day.  For prescription refill requests, have your pharmacy contact our office.         Resources For Cancer Patients and their Caregivers ? American Cancer Society: Can assist with  transportation, wigs, general needs, runs Look Good Feel Better.        530-009-1727 ? Cancer Care: Provides financial assistance, online support groups, medication/co-pay assistance.  1-800-813-HOPE (919) 825-4236) ? [facility] Assists [place] cancer patients and their families through emotional , educational and financial support.  (432)034-6965 ? [place] DSS Where to apply for food stamps, Medicaid and utility assistance. 2482053993 ? RCATS: Transportation to medical appointments. 479-851-2000 ? Social Security Administration: May apply for disability if have a Stage IV cancer. [phone] 563-147-2864 ? [employer], Disability and Transit Services: Assists with nutrition, care and transit needs. [phone]

## 2015-12-21 ENCOUNTER — Ambulatory Visit
Admission: RE | Admit: 2015-12-21 | Discharge: 2015-12-21 | Disposition: A | Discharge status: Home / Self Care | Payer: PPO | Source: Ambulatory Visit | Attending: Radiation Oncology | Admitting: Radiation Oncology

## 2015-12-21 ENCOUNTER — Encounter: Payer: Self-pay | Admitting: Radiation Oncology

## 2015-12-21 DIAGNOSIS — C342 Malignant neoplasm of middle lobe, bronchus or lung: Secondary | ICD-10-CM | POA: Diagnosis not present

## 2015-12-21 DIAGNOSIS — Z510 Encounter for antineoplastic radiation therapy: Secondary | ICD-10-CM | POA: Diagnosis not present

## 2015-12-21 NOTE — Progress Notes (Signed)
Mrs. Pronovost has received 22 fractions to her right lung.  Skin to right chest and back with normal color.  Using sonafine as instructed.  Appetite is good.  Energy level is good.  Denies any pain or discomfort.  Denies SOB, sore throat or swallowing problems. BP 150/81 mmHg  Pulse 90  Temp(Src) 97.6 F (36.4 C)  Resp 16  Ht '5\' 3"'$  (1.6 m)  Wt 166 lb 9.6 oz (75.569 kg)  BMI 29.52 kg/m2  SpO2 98%

## 2015-12-21 NOTE — Progress Notes (Signed)
Weekly Management Note Current Dose: 36 Gy  Projected Dose: 60 Gy   Narrative:  The patient presents for routine under treatment assessment.  CBCT/MVCT images/Port film x-rays were reviewed.  The chart was checked. Doing well. No complaints of dysphagia. Some nosebleeds and clotted blood. Dr. Whitney Muse is monitoring her platelets she says.   Physical Findings: No skin changes on front or back.   Vitals:  Filed Vitals:   12/21/15 1336  BP: 150/81  Pulse: 90  Temp: 97.6 F (36.4 C)  Resp: 16   Weight:  Wt Readings from Last 3 Encounters:  12/21/15 166 lb 9.6 oz (75.569 kg)  12/20/15 164 lb 12.8 oz (74.753 kg)  12/15/15 168 lb 9.6 oz (76.476 kg)   Lab Results  Component Value Date   WBC 3.4* 12/20/2015   HGB 12.2 12/20/2015   HCT 37.1 12/20/2015   MCV 88.8 12/20/2015   PLT 108* 12/20/2015   Lab Results  Component Value Date   CREATININE 0.61 12/20/2015   BUN 12 12/20/2015   NA 139 12/20/2015   K 3.6 12/20/2015   CL 107 12/20/2015   CO2 26 12/20/2015     Impression:  The patient is tolerating radiation.  Plan:  Continue treatment as planned. She will continue to monitor nose bleeds.   ------------------------------------------------  Thea Silversmith, MD  This document serves as a record of services personally performed by Thea Silversmith, MD. It was created on her behalf by Arlyce Harman, a trained medical scribe. The creation of this record is based on the scribe's personal observations and the provider's statements to them. This document has been checked and approved by the attending provider.

## 2015-12-22 ENCOUNTER — Ambulatory Visit
Admission: RE | Admit: 2015-12-22 | Discharge: 2015-12-22 | Disposition: A | Discharge status: Home / Self Care | Payer: PPO | Source: Ambulatory Visit | Attending: Radiation Oncology | Admitting: Radiation Oncology

## 2015-12-22 DIAGNOSIS — Z510 Encounter for antineoplastic radiation therapy: Secondary | ICD-10-CM | POA: Diagnosis not present

## 2015-12-22 DIAGNOSIS — C342 Malignant neoplasm of middle lobe, bronchus or lung: Secondary | ICD-10-CM | POA: Diagnosis not present

## 2015-12-23 ENCOUNTER — Ambulatory Visit
Admission: RE | Admit: 2015-12-23 | Discharge: 2015-12-23 | Disposition: A | Discharge status: Home / Self Care | Payer: PPO | Source: Ambulatory Visit | Attending: Radiation Oncology | Admitting: Radiation Oncology

## 2015-12-23 DIAGNOSIS — C342 Malignant neoplasm of middle lobe, bronchus or lung: Secondary | ICD-10-CM | POA: Diagnosis not present

## 2015-12-23 DIAGNOSIS — Z510 Encounter for antineoplastic radiation therapy: Secondary | ICD-10-CM | POA: Diagnosis not present

## 2015-12-24 ENCOUNTER — Ambulatory Visit
Admission: RE | Admit: 2015-12-24 | Discharge: 2015-12-24 | Disposition: A | Discharge status: Home / Self Care | Payer: PPO | Source: Ambulatory Visit | Attending: Radiation Oncology | Admitting: Radiation Oncology

## 2015-12-24 DIAGNOSIS — C342 Malignant neoplasm of middle lobe, bronchus or lung: Secondary | ICD-10-CM | POA: Diagnosis not present

## 2015-12-24 DIAGNOSIS — Z510 Encounter for antineoplastic radiation therapy: Secondary | ICD-10-CM | POA: Diagnosis not present

## 2015-12-26 NOTE — Assessment & Plan Note (Addendum)
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 Pablo Ledger in December 2016 for right middle lobe nodule biopsy proven to be squamous cell carcinoma with negative lymph nodes (biopsy negative) resulting in Stage IA disease initially.  Now with progressive/recurrence/relapse of disease with hypermetabolic subcarinal and right infrahilar lymphadenopathy of PET imaging resulting in concurrent chemoXRT.  Oncology history is up to date.  Pre-treatment labs as ordered: CBC diff, CMET. She meets treatment parameters today. Of note, her platelet count is 101,000.  She notes intermittent epistaxis at home without any signs or symptoms of upper respiratory tract infection. She does have her heat still on at home. I recommended a humidifier at bedtime next to her bed. Additionally, I recommended Ocean Spray which can be utilized on a when necessary basis.  The patient believes her last radiation treatment is this week, and according to my calculation, she is accurate. As result, she will not need treatment next week. According to her appointment desk in [facility], her last scheduled radiation treatment is this coming Friday, 12/31/2015. She is hesitant to have her Monday follow-up appointment canceled and rescheduled, and therefore, we will keep this appointment, more of a social visit than anything else, and we will utilize this appointment to set up her future surveillance plan and follow-up appointments.

## 2015-12-26 NOTE — Progress Notes (Signed)
Kari Albee, MD [place] [id number]  Primary cancer of right middle lobe of lung [facility])  CURRENT THERAPY: Carboplatin/Paclitaxel weekly beginning on 11/22/2015 with concomitant XRT by Dr. Pablo Ledger in [place] beginning on 11/24/2015.  INTERVAL HISTORY: Kari Bullock 75 y.o. female returns for followup of Stage III (T1AN2Mo) squamous cell carcinoma to subcarinal and right infrahilar lymphadenopathy on PET imaging.    Primary cancer of right middle lobe of lung ([place])   12/18/2014 Imaging CT abd/pelvis- New 6 mm right middle lobe pulmonary nodule with a 17 mm nodule identified in the left lung base, increased in size in the interval. Although growth of the left basilar nodule has been slow, neoplasm cannot be excluded.   12/31/2014 PET scan The 7 mm right middle lobe nodule is hypermetabolic, and there is considerable focal hypermetabolic activity along the posterior inferior margin of the right mainstem bronchus, presumably in the nonenlarged subcarinal lymph node in this vicinity...   03/19/2015 Imaging MRI brain- No evidence of metastatic disease to the brain or meninges.   06/30/2015 Imaging CT chest- Significant interval enlargement of right middle lobe 15 mm nodule (previously 7 mm) and 17 mm subcarinal lymph node. Stable lingular and anterior left lower lobe nodules.   07/15/2015 Procedure CT guided biopsy of right middle lobe nodule, by IR   07/15/2015 Pathology Results Lung, needle/core biopsy(ies), right middle lobe - POSITIVE FOR POORLY DIFFERENTIATED CARCINOMA   07/27/2015 Procedure Fiberoptic video bronchoscopy, endobronchial ultrasound- guided transbronchial biopsy of level 7 mediastinal lymph node - subcarinal lymph node by Dr. Tharon Aquas Tright.   07/27/2015 Pathology Results WANG NEEDLE, FINE NEEDLE ASPIRATION, 7 NODE #2, B (SPECIMEN 2 OF 2, COLLECTED ON 07/27/15): NO MALIGNANT CELLS IDENTIFIED. FINE NEEDLE ASPIRATION, ENDOSCOPIC SPECIMEN A, EBUS 7 NODE  (SPECIMEN 1 OF 2, COLLECTED ON 07/27/2015):NO MALIGNANT CELLS IDENTIFIED.   09/08/2015 - 09/15/2015 Radiation Therapy Right middle lobe/ 54 Gy at 18 GY per fraction x 3 fractions by Dr. Pablo Ledger   10/27/2015 Imaging CT chest- response to therapy of the right middle lobe lung lesion. Only presumed radiation induced scarring remains. Progression of mediastinal adenopathy with probable developing right infrahilar nodal metastasis. Smaller pulmonary nodules are stable   11/08/2015 PET scan Prominent and hypermetabolic subcarinal and right infrahilar adenopathy compatible with malignancy, both the size and metabolic activity of these lymph nodes is significantly increased from April 2016.   11/08/2015 Relapse/Recurrence PET shows progressive disease   11/22/2015 -  Chemotherapy Carboplatin/Paclitaxel weekly   11/23/2015 Imaging MRI brain- No acute or metastatic intracranial abnormality.  Stable MRI appearance of the brain since 2016.   11/24/2015 -  Radiation Therapy Dr. Pablo Ledger    I personally reviewed and went over laboratory results with the patient.  The results are noted within this dictation.  Labs will be updated today.  Labs me treatment parameters today. Of note, her platelet count is 101,000. I believe this is her last cycle of chemotherapy. We will move on with treatment as planned today.  She notes minimal epistaxis with forceful nose blowing. She notes that she still has the heat on in her house. I suspect her air in the house is dry. I've recommended a humidifier at bedtime in the bedroom. She denies any green or yellow discharge from her nose. She denies any signs or symptoms of a upper respiratory tract infection including fevers, chills, sinus pressure, headache, sore throat, etc.   She continues to tolerate treatment without any  problems. She is doing very well. She denies any nausea or vomiting. She denies any cough or hemoptysis.  Past Medical History  Diagnosis Date  . Hypertension   .  Breast cancer ([place])     2001 RT breat lumpectomy rad tx  . GERD (gastroesophageal reflux disease)   . Memory loss   . Pure hypercholesterolemia   . Memory change 10/24/2013  . 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 Pulmonary nodule 01/09/2015  . Headache     years ago, none since hysterectomy  . Anemia     in the past  . Diarrhea   . Complication of anesthesia     confusion  . 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     is allergic to codeine; morphine and related; adhesive; sulfur; and tamoxifen.  Current Outpatient Prescriptions on File Prior to Visit  Medication Sig Dispense Refill  . aspirin EC 81 MG tablet Take 81 mg by mouth daily.    Marland Kitchen CARBOPLATIN IV Inject into the vein. Weekly starting 11/22/15    . cholecalciferol (VITAMIN D) 1000 UNITS tablet Take 1,000 Units by mouth at bedtime. D3    . donepezil (ARICEPT) 10 MG tablet TAKE 1 TABLET BY MOUTH EVERY NIGHT AT BEDTIME 90 tablet 1  . lidocaine-prilocaine (EMLA) cream Apply a quarter size amount to port site 1 hour prior to chemo. Do not rub in. Cover with plastic wrap. 30 g 3  . losartan-hydrochlorothiazide (HYZAAR) 50-12.5 MG per tablet Take 1 tablet by mouth daily.     . naproxen sodium (ANAPROX) 220 MG tablet Take 220 mg by mouth 2 (two) times daily as needed (pain). ALEVE    . omeprazole (PRILOSEC) 20 MG capsule TAKE 1 CAPSULE(20 MG) BY MOUTH TWICE DAILY BEFORE A MEAL 60 capsule 3  . ondansetron (ZOFRAN) 8 MG tablet Take 1 tablet (8 mg total) by mouth every 8 (eight) hours as needed for nausea or vomiting. 30 tablet 2  . PACLitaxel (TAXOL IV) Inject into the vein. Weekly starting 11/22/15    . prochlorperazine (COMPAZINE) 10  MG tablet Take 1 tablet (10 mg total) by mouth every 6 (six) hours as needed for nausea or vomiting. (Patient not taking: Reported on 12/27/2015) 30 tablet 2  . Wound Dressings (SONAFINE EX) Apply topically.     No current facility-administered medications on file prior to visit.    Past Surgical History  Procedure Laterality Date  . Abdominal hysterectomy    . Bilateral oophorectomy  2007  . Incision and drainage of wound  2011    buttocks  . Tonsillectomy    . Cataract extraction Bilateral     lens implant  . Carpal tunnel release Right   . Colonoscopy  2011    RMR: 1. Anal papilla, otherwise normal rectum. 2. Left-sided diverticula, single diminutive polyp in the sigmoid segment status post cold biospy removal. Remainder of the colonic mucosa appeared normal.   . Colonoscopy N/A 01/14/2015    Procedure: COLONOSCOPY;  Surgeon: Daneil Dolin, MD;  Location: [facility] ENDO SUITE;  Service: Endoscopy;  Laterality: N/A;  930 - moved to 10:15  . Esophagogastroduodenoscopy N/A 01/14/2015    RMR: Colonic diverticulosis. status post segmental biospy and stool sampling.  No egd  done today  . Breast surgery Right     right lumpectomy, lymph glands remove  . Video bronchoscopy with endobronchial ultrasound N/A 01/19/2015    Procedure: VIDEO BRONCHOSCOPY WITH ENDOBRONCHIAL ULTRASOUND;  Surgeon: Ivin Poot, MD;  Location: [facility] OR;  Service: Thoracic;  Laterality: N/A;  . Esophagogastroduodenoscopy N/A 02/11/2015    RMR: Focally abnormal antrum of doubtful clinical significance- Status post biopsy.   . Video bronchoscopy with endobronchial ultrasound N/A 07/27/2015    Procedure: VIDEO BRONCHOSCOPY WITH ENDOBRONCHIAL ULTRASOUND;  Surgeon: Ivin Poot, MD;  Location: [facility] OR;  Service: Thoracic;  Laterality: N/A;    Denies any headaches, dizziness, double vision, fevers, chills, night sweats, nausea, vomiting, diarrhea, constipation, chest pain, heart palpitations, shortness of breath, blood in stool,  black tarry stool, urinary pain, urinary burning, urinary frequency, hematuria.   PHYSICAL EXAMINATION  ECOG PERFORMANCE STATUS: 1 - Symptomatic but completely ambulatory  There were no vitals filed for this visit.  Blood pressure 131/63 Pulse 94 Respirations 18 Temperature 97.17F Oxygen saturation 99% on room air.  GENERAL:alert, no distress, well nourished, well developed, comfortable, cooperative, smiling and accompanied by her husband. SKIN: skin color, texture, turgor are normal, no rashes or significant lesions HEAD: Normocephalic, No masses, lesions, tenderness or abnormalities EYES: normal, EOMI, Conjunctiva are pink and non-injected EARS: External ears normal OROPHARYNX:lips, buccal mucosa, and tongue normal and mucous membranes are moist  NECK: supple, thyroid normal size, non-tender, without nodularity, trachea midline LYMPH:  no palpable lymphadenopathy BREAST:not examined LUNGS: clear to auscultation and percussion without wheezes, rales, or rhonchi. HEART: regular rate & rhythm without murmur, rub, or gallop. Normal S1 and S2.  ABDOMEN:abdomen soft and normal bowel sounds BACK: Back symmetric, no curvature. EXTREMITIES:less then 2 second capillary refill, no joint deformities, effusion, or inflammation, no skin discoloration, no cyanosis  NEURO: alert & oriented x 3 with fluent speech, no focal motor/sensory deficits, gait normal   LABORATORY DATA: CBC    Component Value Date/Time   WBC 3.4* 12/27/2015 0830   RBC 4.09 12/27/2015 0830   HGB 12.0 12/27/2015 0830   HCT 36.3 12/27/2015 0830   PLT 101* 12/27/2015 0830   MCV 88.8 12/27/2015 0830   MCH 29.3 12/27/2015 0830   MCHC 33.1 12/27/2015 0830   RDW 14.4 12/27/2015 0830   LYMPHSABS 0.5* 12/27/2015 0830   MONOABS 0.3 12/27/2015 0830   EOSABS 0.0 12/27/2015 0830   BASOSABS 0.0 12/27/2015 0830      Chemistry      Component Value Date/Time   NA 139 12/27/2015 0830   K 3.5 12/27/2015 0830   CL 105  12/27/2015 0830   CO2 27 12/27/2015 0830   BUN 15 12/27/2015 0830   CREATININE 0.66 12/27/2015 0830   CREATININE 0.69 12/16/2014 1058      Component Value Date/Time   CALCIUM 9.5 12/27/2015 0830   ALKPHOS 52 12/27/2015 0830   AST 23 12/27/2015 0830   ALT 16 12/27/2015 0830   BILITOT 0.5 12/27/2015 0830        PENDING LABS:   RADIOGRAPHIC STUDIES:  No results found.   PATHOLOGY:    ASSESSMENT AND PLAN:  Primary cancer of right middle lobe of lung ([place]) Stage IIIA ([id number]) squamous cell carcinoma of right middle lobe having undergone curative SBRT by Dr. Pablo Ledger in December 2016 for right middle lobe nodule biopsy proven to be squamous cell carcinoma with negative lymph nodes (biopsy negative) resulting in Stage IA disease initially.  Now with progressive/recurrence/relapse of disease with hypermetabolic  subcarinal and right infrahilar lymphadenopathy of PET imaging resulting in concurrent chemoXRT.  Oncology history is up to date.  Pre-treatment labs as ordered: CBC diff, CMET. She meets treatment parameters today. Of note, her platelet count is 101,000.  She notes intermittent epistaxis at home without any signs or symptoms of upper respiratory tract infection. She does have her heat still on at home. I recommended a humidifier at bedtime next to her bed. Additionally, I recommended Ocean Spray which can be utilized on a when necessary basis.  The patient believes her last radiation treatment is this week, and according to my calculation, she is accurate. As result, she will not need treatment next week. According to her appointment desk in [facility], her last scheduled radiation treatment is this coming Friday, 12/31/2015. She is hesitant to have her Monday follow-up appointment canceled and rescheduled, and therefore, we will keep this appointment, more of a social visit than anything else, and we will utilize this appointment to set up her future surveillance plan and follow-up  appointments.   THERAPY PLAN:  Continue concomitant chemoXRT as outlined above.  All questions were answered. The patient knows to call the clinic with any problems, questions or concerns. We can certainly see the patient much sooner if necessary.  Patient and plan discussed with Dr. Ancil Linsey and she is in agreement with the aforementioned.   This note is electronically signed by: Doy Mince 12/27/2015 5:27 PM

## 2015-12-27 ENCOUNTER — Encounter (HOSPITAL_BASED_OUTPATIENT_CLINIC_OR_DEPARTMENT_OTHER): Payer: PPO

## 2015-12-27 ENCOUNTER — Encounter (HOSPITAL_BASED_OUTPATIENT_CLINIC_OR_DEPARTMENT_OTHER): Payer: PPO | Admitting: Oncology

## 2015-12-27 ENCOUNTER — Ambulatory Visit: Payer: Medicare HMO | Admitting: Adult Health

## 2015-12-27 ENCOUNTER — Ambulatory Visit
Admission: RE | Admit: 2015-12-27 | Discharge: 2015-12-27 | Disposition: A | Discharge status: Home / Self Care | Payer: PPO | Source: Ambulatory Visit | Attending: Radiation Oncology | Admitting: Radiation Oncology

## 2015-12-27 VITALS — BP 122/65 | HR 81 | Temp 98.5°F | Resp 16 | Wt 165.6 lb

## 2015-12-27 DIAGNOSIS — Z510 Encounter for antineoplastic radiation therapy: Secondary | ICD-10-CM | POA: Diagnosis not present

## 2015-12-27 DIAGNOSIS — Z5111 Encounter for antineoplastic chemotherapy: Secondary | ICD-10-CM

## 2015-12-27 DIAGNOSIS — C342 Malignant neoplasm of middle lobe, bronchus or lung: Secondary | ICD-10-CM | POA: Diagnosis not present

## 2015-12-27 DIAGNOSIS — C50911 Malignant neoplasm of unspecified site of right female breast: Secondary | ICD-10-CM

## 2015-12-27 DIAGNOSIS — Z853 Personal history of malignant neoplasm of breast: Secondary | ICD-10-CM

## 2015-12-27 LAB — CBC WITH DIFFERENTIAL/PLATELET
BASOS ABS: 0 10*3/uL (ref 0.0–0.1)
BASOS PCT: 1 %
EOS ABS: 0 10*3/uL (ref 0.0–0.7)
EOS PCT: 1 %
HCT: 36.3 % (ref 36.0–46.0)
HEMOGLOBIN: 12 g/dL (ref 12.0–15.0)
LYMPHS ABS: 0.5 10*3/uL — AB (ref 0.7–4.0)
Lymphocytes Relative: 15 %
MCH: 29.3 pg (ref 26.0–34.0)
MCHC: 33.1 g/dL (ref 30.0–36.0)
MCV: 88.8 fL (ref 78.0–100.0)
Monocytes Absolute: 0.3 10*3/uL (ref 0.1–1.0)
Monocytes Relative: 8 %
NEUTROS PCT: 75 %
Neutro Abs: 2.6 10*3/uL (ref 1.7–7.7)
PLATELETS: 101 10*3/uL — AB (ref 150–400)
RBC: 4.09 MIL/uL (ref 3.87–5.11)
RDW: 14.4 % (ref 11.5–15.5)
WBC: 3.4 10*3/uL — AB (ref 4.0–10.5)

## 2015-12-27 LAB — COMPREHENSIVE METABOLIC PANEL
ALT: 16 U/L (ref 14–54)
AST: 23 U/L (ref 15–41)
Albumin: 3.7 g/dL (ref 3.5–5.0)
Alkaline Phosphatase: 52 U/L (ref 38–126)
Anion gap: 7 (ref 5–15)
BILIRUBIN TOTAL: 0.5 mg/dL (ref 0.3–1.2)
BUN: 15 mg/dL (ref 6–20)
CHLORIDE: 105 mmol/L (ref 101–111)
CO2: 27 mmol/L (ref 22–32)
CREATININE: 0.66 mg/dL (ref 0.44–1.00)
Calcium: 9.5 mg/dL (ref 8.9–10.3)
Glucose, Bld: 128 mg/dL — ABNORMAL HIGH (ref 65–99)
Potassium: 3.5 mmol/L (ref 3.5–5.1)
SODIUM: 139 mmol/L (ref 135–145)
TOTAL PROTEIN: 6.3 g/dL — AB (ref 6.5–8.1)

## 2015-12-27 MED ORDER — SODIUM CHLORIDE 0.9% FLUSH
10.0000 mL | INTRAVENOUS | Status: DC | PRN
  Administered 2015-12-27: 10 mL
  Filled 2015-12-27: qty 10

## 2015-12-27 MED ORDER — FAMOTIDINE IN NACL 20-0.9 MG/50ML-% IV SOLN
20.0000 mg | Freq: Once | INTRAVENOUS | Status: AC
  Administered 2015-12-27: 20 mg via INTRAVENOUS
  Filled 2015-12-27: qty 50

## 2015-12-27 MED ORDER — PALONOSETRON HCL INJECTION 0.25 MG/5ML
0.2500 mg | Freq: Once | INTRAVENOUS | Status: AC
  Administered 2015-12-27: 0.25 mg via INTRAVENOUS
  Filled 2015-12-27: qty 5

## 2015-12-27 MED ORDER — SODIUM CHLORIDE 0.9 % IV SOLN
164.2000 mg | Freq: Once | INTRAVENOUS | Status: AC
  Administered 2015-12-27: 160 mg via INTRAVENOUS
  Filled 2015-12-27: qty 16

## 2015-12-27 MED ORDER — DIPHENHYDRAMINE HCL 50 MG/ML IJ SOLN
50.0000 mg | Freq: Once | INTRAMUSCULAR | Status: AC
  Administered 2015-12-27: 50 mg via INTRAVENOUS
  Filled 2015-12-27: qty 1

## 2015-12-27 MED ORDER — SODIUM CHLORIDE 0.9 % IV SOLN
20.0000 mg | Freq: Once | INTRAVENOUS | Status: AC
  Administered 2015-12-27: 20 mg via INTRAVENOUS
  Filled 2015-12-27: qty 2

## 2015-12-27 MED ORDER — HEPARIN SOD (PORK) LOCK FLUSH 100 UNIT/ML IV SOLN
500.0000 [IU] | Freq: Once | INTRAVENOUS | Status: AC | PRN
  Administered 2015-12-27: 500 [IU]

## 2015-12-27 MED ORDER — PACLITAXEL CHEMO INJECTION 300 MG/50ML
45.0000 mg/m2 | Freq: Once | INTRAVENOUS | Status: AC
  Administered 2015-12-27: 84 mg via INTRAVENOUS
  Filled 2015-12-27: qty 14

## 2015-12-27 MED ORDER — SODIUM CHLORIDE 0.9 % IV SOLN
Freq: Once | INTRAVENOUS | Status: AC
  Administered 2015-12-27: 09:00:00 via INTRAVENOUS

## 2015-12-27 MED ORDER — HEPARIN SOD (PORK) LOCK FLUSH 100 UNIT/ML IV SOLN
INTRAVENOUS | Status: AC
  Filled 2015-12-27: qty 5

## 2015-12-27 NOTE — Patient Instructions (Signed)
[facility] Discharge Instructions for Patients Receiving Chemotherapy   Beginning January 23rd 2017 lab work for the [facility] will be done in the  Main lab at [facility] on 1st floor. If you have a lab appointment with the [place] please come in thru the  Main Entrance and check in at the main information desk   Today you received the following chemotherapy agents week 6 Taxol and Carbo. If this Friday is your last day of radiation you will not need chemo next week. Follow up with radiation and let us know for sure if Friday is the last day of radiation. Port will need to be flushed every 6-8 weeks when not in use for chemo. Return as scheduled.  To help prevent nausea and vomiting after your treatment, we encourage you to take your nausea medication as instructed.   If you develop nausea and vomiting, or diarrhea that is not controlled by your medication, call the clinic.  The clinic phone number is [phone]. Office hours are Monday-Friday 8:30am-5:00pm.  BELOW ARE SYMPTOMS THAT SHOULD BE REPORTED IMMEDIATELY:  *FEVER GREATER THAN 101.0 F  *CHILLS WITH OR WITHOUT FEVER  NAUSEA AND VOMITING THAT IS NOT CONTROLLED WITH YOUR NAUSEA MEDICATION  *UNUSUAL SHORTNESS OF BREATH  *UNUSUAL BRUISING OR BLEEDING  TENDERNESS IN MOUTH AND THROAT WITH OR WITHOUT PRESENCE OF ULCERS  *URINARY PROBLEMS  *BOWEL PROBLEMS  UNUSUAL RASH Items with * indicate a potential emergency and should be followed up as soon as possible. If you have an emergency after office hours please contact your primary care physician or go to the nearest emergency department.  Please call the clinic during office hours if you have any questions or concerns.   You may also contact the Patient Navigator at [phone] should you have any questions or need assistance in obtaining follow up care.   Resources For Cancer Patients and their Caregivers ? American Cancer Society: Can  assist with transportation, wigs, general needs, runs Look Good Feel Better.        (626) 675-0412 ? Cancer Care: Provides financial assistance, online support groups, medication/co-pay assistance.  1-800-813-HOPE 5192920188) ? [place] Assists [place] cancer patients and their families through emotional , educational and financial support.  (404)841-3790 ? [place] DSS Where to apply for food stamps, Medicaid and utility assistance. 213-817-0602 ? [facility]: Transportation to medical appointments. 858-854-1504 ? Social Security Administration: May apply for disability if have a Stage IV cancer. 779-332-4982 440 839 8281 ? [employer], Disability and Transit Services: Assists with nutrition, care and transit needs. 3097968668

## 2015-12-27 NOTE — Progress Notes (Signed)
Denies any complaints with chemo however, reports her nose bleeds a little when she blows her nose.  Tolerated chemo well. Ambulatory on discharge home with husband.

## 2015-12-28 ENCOUNTER — Ambulatory Visit
Admission: RE | Admit: 2015-12-28 | Discharge: 2015-12-28 | Disposition: A | Discharge status: Home / Self Care | Payer: PPO | Source: Ambulatory Visit | Attending: Radiation Oncology | Admitting: Radiation Oncology

## 2015-12-28 ENCOUNTER — Encounter: Payer: Self-pay | Admitting: Radiation Oncology

## 2015-12-28 VITALS — BP 120/62 | HR 87 | Temp 97.9°F | Resp 18 | Ht 63.0 in | Wt 166.6 lb

## 2015-12-28 DIAGNOSIS — Z510 Encounter for antineoplastic radiation therapy: Secondary | ICD-10-CM | POA: Diagnosis not present

## 2015-12-28 DIAGNOSIS — C342 Malignant neoplasm of middle lobe, bronchus or lung: Secondary | ICD-10-CM | POA: Diagnosis not present

## 2015-12-28 NOTE — Progress Notes (Addendum)
Kari Bullock has received 27 fractions to her right lung.  Skin to right chest with slight tanning, using Sonafine .  Appetite is good.  Energy level is still good.  Had some nausea and vomiting yesterday small amount once did not take any anti-nausea medication Zofran or Compazine.  Denies pain today.  EOT education done 12-27-15.  Denies SOB, no sore throat or swallowing problems.   Last chemotherapy 12-27-15 Carboplatin IV and Taxol IV.  Last lab CMET and CBCwdiff. 12-27-15 BP 120/62 mmHg  Pulse 87  Temp(Src) 97.9 F (36.6 C) (Oral)  Resp 18  Ht '5\' 3"'$  (1.6 m)  Wt 166 lb 9.6 oz (75.569 kg)  BMI 29.52 kg/m2  SpO2 95%

## 2015-12-28 NOTE — Progress Notes (Signed)
Weekly Management Note Current Dose: 54 Gy  Projected Dose: 60 Gy   Narrative:  The patient presents for routine under treatment assessment.  CBCT/MVCT images/Port film x-rays were reviewed.  The chart was checked. Doing well. No complaints of dysphagia. Nausea yesterday. No diarrhea.   Physical Findings: No skin changes on front or back.   Vitals:  Filed Vitals:   12/28/15 1407  BP: 120/62  Pulse: 87  Temp: 97.9 F (36.6 C)  Resp: 18   Weight:  Wt Readings from Last 3 Encounters:  12/28/15 166 lb 9.6 oz (75.569 kg)  12/27/15 165 lb 9.6 oz (75.116 kg)  12/21/15 166 lb 9.6 oz (75.569 kg)   Lab Results  Component Value Date   WBC 3.4* 12/27/2015   HGB 12.0 12/27/2015   HCT 36.3 12/27/2015   MCV 88.8 12/27/2015   PLT 101* 12/27/2015   Lab Results  Component Value Date   CREATININE 0.66 12/27/2015   BUN 15 12/27/2015   NA 139 12/27/2015   K 3.5 12/27/2015   CL 105 12/27/2015   CO2 27 12/27/2015     Impression:  The patient is tolerating radiation.  Plan:  Continue treatment as planned. Follow up in 1 month. Pt will call. Dr. Whitney Muse for follow up and CT scan scheduling.   ------------------------------------------------  Thea Silversmith, MD  This document serves as a record of services personally performed by Thea Silversmith, MD. It was created on her behalf by Arlyce Harman, a trained medical scribe. The creation of this record is based on the scribe's personal observations and the provider's statements to them. This document has been checked and approved by the attending provider.

## 2015-12-29 ENCOUNTER — Ambulatory Visit
Admission: RE | Admit: 2015-12-29 | Discharge: 2015-12-29 | Disposition: A | Discharge status: Home / Self Care | Payer: PPO | Source: Ambulatory Visit | Attending: Radiation Oncology | Admitting: Radiation Oncology

## 2015-12-29 DIAGNOSIS — C342 Malignant neoplasm of middle lobe, bronchus or lung: Secondary | ICD-10-CM | POA: Diagnosis not present

## 2015-12-29 DIAGNOSIS — Z510 Encounter for antineoplastic radiation therapy: Secondary | ICD-10-CM | POA: Diagnosis not present

## 2015-12-30 ENCOUNTER — Ambulatory Visit
Admission: RE | Admit: 2015-12-30 | Discharge: 2015-12-30 | Disposition: A | Discharge status: Home / Self Care | Payer: PPO | Source: Ambulatory Visit | Attending: Radiation Oncology | Admitting: Radiation Oncology

## 2015-12-30 DIAGNOSIS — Z510 Encounter for antineoplastic radiation therapy: Secondary | ICD-10-CM | POA: Diagnosis not present

## 2015-12-30 DIAGNOSIS — C342 Malignant neoplasm of middle lobe, bronchus or lung: Secondary | ICD-10-CM | POA: Diagnosis not present

## 2015-12-31 ENCOUNTER — Encounter: Payer: Self-pay | Admitting: Radiation Oncology

## 2015-12-31 ENCOUNTER — Ambulatory Visit
Admission: RE | Admit: 2015-12-31 | Discharge: 2015-12-31 | Disposition: A | Discharge status: Home / Self Care | Payer: PPO | Source: Ambulatory Visit | Attending: Radiation Oncology | Admitting: Radiation Oncology

## 2015-12-31 DIAGNOSIS — C342 Malignant neoplasm of middle lobe, bronchus or lung: Secondary | ICD-10-CM | POA: Diagnosis not present

## 2015-12-31 DIAGNOSIS — Z510 Encounter for antineoplastic radiation therapy: Secondary | ICD-10-CM | POA: Diagnosis not present

## 2016-01-03 ENCOUNTER — Inpatient Hospital Stay (HOSPITAL_COMMUNITY): Payer: PPO

## 2016-01-03 ENCOUNTER — Ambulatory Visit (HOSPITAL_COMMUNITY): Payer: PPO | Admitting: Hematology & Oncology

## 2016-01-04 NOTE — Progress Notes (Signed)
This encounter was created in error - please disregard.

## 2016-01-05 ENCOUNTER — Other Ambulatory Visit: Payer: Self-pay | Admitting: Radiation Oncology

## 2016-01-05 DIAGNOSIS — C342 Malignant neoplasm of middle lobe, bronchus or lung: Secondary | ICD-10-CM

## 2016-01-13 ENCOUNTER — Ambulatory Visit (INDEPENDENT_AMBULATORY_CARE_PROVIDER_SITE_OTHER): Payer: PPO | Admitting: Adult Health

## 2016-01-13 ENCOUNTER — Encounter: Payer: Self-pay | Admitting: Adult Health

## 2016-01-13 VITALS — BP 168/93 | HR 88 | Ht 63.0 in | Wt 165.2 lb

## 2016-01-13 DIAGNOSIS — R413 Other amnesia: Secondary | ICD-10-CM

## 2016-01-13 MED ORDER — DONEPEZIL HCL 10 MG PO TABS: 10.0000 mg | ORAL_TABLET | Freq: Every day | ORAL | Status: DC

## 2016-01-13 NOTE — Patient Instructions (Signed)
Memory score is slightly declined Continue Aricept If your symptoms worsen or you develop new symptoms please let us know.

## 2016-01-13 NOTE — Progress Notes (Signed)
PATIENT: Kari Bullock DOB: 1940/12/13  REASON FOR VISIT: follow up-mild memory disorder HISTORY FROM: patient  HISTORY OF PRESENT ILLNESS: Kari Bullock is a 75 year old female with a history of mild memory disorder. She returns today for follow-up. The patient is currently on Aricept and is tolerating it well. She feels that her memory has remained the same. She continues to live at home with her husband. She is able to complete all ADLs independentely. She operates a vehicle without difficulty. She prepare meals and completes finances without difficulty. She states that her and her husband continue to mow the grass at her church. She states that she was diagnosed with lung cancer and has undergone chemotherapy and radiation. She reports that she has a follow-up scan in 4-5 weeks. She returns today for an evaluation.  HISTORY 06/28/15 (MM): Kari Bullock is a 75 year old female with a history of mild memory disorder. She returns today for follow-up. The patient continues on Aricept and tolerates the medication well. She denies any significant changes with her memory. She is able to complete all ADLs independently. She lives at home alone. She is able to prepare her own meals. She completes her finances without difficulty. She operates a Teacher, music without difficulty. Denies having to give up anything due to her memory. Patient reports that she does have a runny nose since starting Aricept. She returns today for an evaluation.  HISTORY 10/27/2014 Kari Bullock): Kari Bullock is a 75 year old right-handed white female with a history of a mild memory disorder. The patient has done fairly well over the last 6 months, she denies any significant changes in her memory. She has lost some weight, and she is sleeping better at night, she feels as if her memory and cognitive capacity has improved. The patient is on Aricept, she is tolerating medication well. She has not had any significant medical issues that have  come up since last seen. The patient returns to this office for an evaluation. She no longer requires Benadryl at night for sleep.   REVIEW OF SYSTEMS: Out of a complete 14 system review of symptoms, the patient complains only of the following symptoms, and all other reviewed systems are negative.  See history of present illness  ALLERGIES: Allergies  Allergen Reactions  . Codeine Nausea And Vomiting  . Morphine And Related Itching  . Adhesive [Tape] Rash    Please use paper tape  . Sulfur Rash    Vomitting  . Tamoxifen Other (See Comments)    Blurred vision and dizziness    HOME MEDICATIONS: Outpatient Prescriptions Prior to Visit  Medication Sig Dispense Refill  . aspirin EC 81 MG tablet Take 81 mg by mouth daily.    Marland Kitchen CARBOPLATIN IV Inject into the vein. Reported on 12/28/2015    . cholecalciferol (VITAMIN D) 1000 UNITS tablet Take 1,000 Units by mouth at bedtime. D3    . lidocaine-prilocaine (EMLA) cream Apply a quarter size amount to port site 1 hour prior to chemo. Do not rub in. Cover with plastic wrap. 30 g 3  . losartan-hydrochlorothiazide (HYZAAR) 50-12.5 MG per tablet Take 1 tablet by mouth daily.     . naproxen sodium (ANAPROX) 220 MG tablet Take 220 mg by mouth 2 (two) times daily as needed (pain). ALEVE    . omeprazole (PRILOSEC) 20 MG capsule TAKE 1 CAPSULE(20 MG) BY MOUTH TWICE DAILY BEFORE A MEAL 60 capsule 3  . ondansetron (ZOFRAN) 8 MG tablet Take 1 tablet (8 mg total) by mouth  every 8 (eight) hours as needed for nausea or vomiting. 30 tablet 2  . PACLitaxel (TAXOL IV) Inject into the vein. Reported on 12/28/2015    . prochlorperazine (COMPAZINE) 10 MG tablet Take 1 tablet (10 mg total) by mouth every 6 (six) hours as needed for nausea or vomiting. 30 tablet 2  . Wound Dressings (SONAFINE EX) Apply topically.    . donepezil (ARICEPT) 10 MG tablet TAKE 1 TABLET BY MOUTH EVERY NIGHT AT BEDTIME 90 tablet 1   No facility-administered medications prior to visit.     PAST MEDICAL HISTORY: Past Medical History  Diagnosis Date  . Hypertension   . Breast cancer ([place])     2001 RT breat lumpectomy rad tx  . GERD (gastroesophageal reflux disease)   . Memory loss   . Pure hypercholesterolemia   . Memory change 10/24/2013  . 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 Pulmonary nodule 01/09/2015  . Headache     years ago, none since hysterectomy  . Anemia     in the past  . Diarrhea   . Complication of anesthesia     confusion  . Lung cancer ([place]) 2017    PAST SURGICAL HISTORY: Past Surgical History  Procedure Laterality Date  . Abdominal hysterectomy    . Bilateral oophorectomy  2007  . Incision and drainage of wound  2011    buttocks  . Tonsillectomy    . Cataract extraction Bilateral     lens implant  . Carpal tunnel release Right   . Colonoscopy  2011    RMR: 1. Anal papilla, otherwise normal rectum. 2. Left-sided diverticula, single diminutive polyp in the sigmoid segment status post cold biospy removal. Remainder of the colonic mucosa appeared normal.   . Colonoscopy N/A 01/14/2015    Procedure: COLONOSCOPY;  Surgeon: Daneil Dolin, MD;  Location: [facility];  Service: Endoscopy;  Laterality: N/A;  930 - moved to 10:15  . Esophagogastroduodenoscopy N/A 01/14/2015    RMR: Colonic diverticulosis. status post segmental biospy and stool sampling.  No egd done today  . Breast surgery Right     right lumpectomy, lymph glands remove  . Video bronchoscopy with endobronchial ultrasound N/A 01/19/2015    Procedure: VIDEO BRONCHOSCOPY WITH ENDOBRONCHIAL ULTRASOUND;  Surgeon: Ivin Poot, MD;  Location: [facility] OR;  Service: Thoracic;  Laterality: N/A;  . Esophagogastroduodenoscopy N/A 02/11/2015    RMR: Focally abnormal antrum of doubtful clinical significance- Status post biopsy.   . Video bronchoscopy with endobronchial ultrasound N/A 07/27/2015    Procedure: VIDEO BRONCHOSCOPY WITH ENDOBRONCHIAL ULTRASOUND;  Surgeon: Ivin Poot, MD;  Location: [place];  Service: Thoracic;  Laterality: N/A;    FAMILY HISTORY: Family History  Problem Relation Age of Onset  . Stroke Mother   . Breast cancer Maternal Aunt   . Breast cancer Maternal Grandmother   . Heart attack Father   . Pancreatic cancer Brother     SOCIAL HISTORY: Social History   Social History  . Marital Status: Married    Spouse Name: N/A  . Number of Children: 3  . Years of Education: 12   Occupational History  . Retired    Social History Main Topics  . Smoking status: Former Smoker -- 1.00 packs/day for 20 years    Types: Cigarettes    Quit date: 11/20/1999  . Smokeless tobacco: Never Used  . Alcohol Use: No     Comment: occasional wine  . Drug Use: No  .  Sexual Activity: Not on file   Other Topics Concern  . Not on file   Social History Narrative      PHYSICAL EXAM  Filed Vitals:   01/13/16 0915  BP: 168/93  Pulse: 88  Height: '5\' 3"'$  (1.6 m)  Weight: 165 lb 3.2 oz (74.934 kg)   Body mass index is 29.27 kg/(m^2).   MMSE - Mini Mental State Exam 01/13/2016 06/28/2015 10/27/2014  Orientation to time '4 5 5  '$ Orientation to Place '4 5 5  '$ Registration '3 3 3  '$ Attention/ Calculation '4 3 3  '$ Recall '1 3 2  '$ Language- name 2 objects '2 2 2  '$ Language- repeat '1 1 1  '$ Language- follow 3 step command '3 3 3  '$ Language- read & follow direction '1 1 1  '$ Write a sentence 0 1 1  Copy design '1 1 1  '$ Total score '24 28 27    '$ Generalized: Well developed, in no acute distress   Neurological examination  Mentation: Alert. Follows all commands speech and language fluent Cranial nerve II-XII: Pupils were equal round reactive to light. Extraocular movements were full, visual field were full on confrontational test. Facial sensation and strength were normal. Uvula tongue midline. Head turning and shoulder shrug  were normal and symmetric. Motor: The motor testing reveals 5 over 5 strength of all 4 extremities. Good symmetric motor tone is noted  throughout.  Sensory: Sensory testing is intact to soft touch on all 4 extremities. No evidence of extinction is noted.  Coordination: Cerebellar testing reveals good finger-nose-finger and heel-to-shin bilaterally.  Gait and station: Gait is normal. Tandem gait is normal. Romberg is negative. No drift is seen.  Reflexes: Deep tendon reflexes are symmetric and normal bilaterally.   DIAGNOSTIC DATA (LABS, IMAGING, TESTING) - I reviewed patient records, labs, notes, testing and imaging myself where available.  Lab Results  Component Value Date   WBC 3.4* 12/27/2015   HGB 12.0 12/27/2015   HCT 36.3 12/27/2015   MCV 88.8 12/27/2015   PLT 101* 12/27/2015      Component Value Date/Time   NA 139 12/27/2015 0830   K 3.5 12/27/2015 0830   CL 105 12/27/2015 0830   CO2 27 12/27/2015 0830   GLUCOSE 128* 12/27/2015 0830   BUN 15 12/27/2015 0830   CREATININE 0.66 12/27/2015 0830   CREATININE 0.69 12/16/2014 1058   CALCIUM 9.5 12/27/2015 0830   PROT 6.3* 12/27/2015 0830   ALBUMIN 3.7 12/27/2015 0830   AST 23 12/27/2015 0830   ALT 16 12/27/2015 0830   ALKPHOS 52 12/27/2015 0830   BILITOT 0.5 12/27/2015 0830   GFRNONAA >60 12/27/2015 0830   GFRAA >60 12/27/2015 0830       ASSESSMENT AND PLAN 75 y.o. year old female  has a past medical history of Hypertension; Breast cancer ([place]); GERD (gastroesophageal reflux disease); Memory loss; Pure hypercholesterolemia; Memory change (10/24/2013); Infiltrating ductal carcinoma of right breast ([place]) (01/09/2015); Pulmonary nodule (01/09/2015); Headache; Anemia; Diarrhea; Complication of anesthesia; and Lung cancer ([place]) (2017). here with:  1. Memory disturbance  The patient's memory score has slightly declined. MMSE today is 24/30 was previously 28/30. She will remain on Aricept. We will continue to monitor her memory. She will return in 4 months for an evaluation. If her memory score continues to decline we will add on Namenda. Patient is amenable to  this plan. She will follow-up in 4 months or sooner if needed.     Ward Givens, MSN, NP-C 01/13/2016, 10:01 AM [facility]  [address], [address], [place] [phone]

## 2016-01-13 NOTE — Progress Notes (Signed)
I agree with the assessment and plan as directed by NP .The patient was seen in my role as a Otis Dials, MD

## 2016-01-14 ENCOUNTER — Ambulatory Visit (HOSPITAL_COMMUNITY): Payer: PPO

## 2016-01-17 ENCOUNTER — Ambulatory Visit (HOSPITAL_COMMUNITY): Payer: Medicare HMO | Admitting: Hematology & Oncology

## 2016-01-18 NOTE — Progress Notes (Signed)
  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  Beams/energy:   IMRT-VMAT / 6X  Narrative: The patient tolerated radiation treatment relatively well.   The patient had no complaints of dysphagia and no skin changes on front or back.  Plan: The patient has completed radiation treatment. The patient will return to radiation oncology clinic for routine followup in one month. I advised them to call or return sooner if they have any questions or concerns related to their recovery or treatment.  ------------------------------------------------  Thea Silversmith, MD  This document serves as a record of services personally performed by Thea Silversmith, MD. It was created on her behalf by Arlyce Harman, a trained medical scribe. The creation of this record is based on the scribe's personal observations and the provider's statements to them. This document has been checked and approved by the attending provider.

## 2016-02-03 ENCOUNTER — Ambulatory Visit: Payer: Self-pay | Admitting: Radiation Oncology

## 2016-02-10 ENCOUNTER — Telehealth: Payer: Self-pay | Admitting: *Deleted

## 2016-02-10 NOTE — Telephone Encounter (Signed)
CALLED PATIENT TO ALTER FU APPT. PER DR. Pablo Ledger, MOVED FU TO 1 PM ON 02-17-16, LVM FOR A RETURN CALL

## 2016-02-11 ENCOUNTER — Ambulatory Visit (HOSPITAL_COMMUNITY)
Admission: RE | Admit: 2016-02-11 | Discharge: 2016-02-11 | Disposition: A | Discharge status: Home / Self Care | Payer: PPO | Source: Ambulatory Visit | Attending: Radiation Oncology | Admitting: Radiation Oncology

## 2016-02-11 DIAGNOSIS — J9809 Other diseases of bronchus, not elsewhere classified: Secondary | ICD-10-CM | POA: Diagnosis not present

## 2016-02-11 DIAGNOSIS — Z923 Personal history of irradiation: Secondary | ICD-10-CM | POA: Insufficient documentation

## 2016-02-11 DIAGNOSIS — I2510 Atherosclerotic heart disease of native coronary artery without angina pectoris: Secondary | ICD-10-CM | POA: Diagnosis not present

## 2016-02-11 DIAGNOSIS — C3411 Malignant neoplasm of upper lobe, right bronchus or lung: Secondary | ICD-10-CM | POA: Diagnosis not present

## 2016-02-11 DIAGNOSIS — C342 Malignant neoplasm of middle lobe, bronchus or lung: Secondary | ICD-10-CM | POA: Diagnosis not present

## 2016-02-11 DIAGNOSIS — J432 Centrilobular emphysema: Secondary | ICD-10-CM | POA: Insufficient documentation

## 2016-02-11 NOTE — Progress Notes (Signed)
Kari Bullock is here for a one month for follow up visit for cancer of Infiltrating ductal carcinoma of right breast; right middle lobe of lung.  Weight changes, if any:  Wt Readings from Last 3 Encounters:  02/17/16 163 lb (73.936 kg)  02/16/16 163 lb (73.936 kg)  01/13/16 165 lb 3.2 oz (74.934 kg)   Respiratory complaints, if any: Denies SOB, coughing nonproductive Hemoptysis, if any:  No Swallowing Problems/Pain/Difficulty swallowing:No Smoking Tobacco/Marijuana/Snuff/ETOH use: Quit 2001 Smoked 30 years 1 pack/day, No ETOH or drug usage. Skin: Normal color to chest Pain : None Appetite:Good Energy level:No fatigue When is next chemo scheduled?: Last chemotherapy 12-27-15 Carboplatin Iv and Taxol IV Lab work from of chart: 12-27-15 CMET and CBC wdiff BP 128/75 mmHg  Pulse 90  Temp(Src) 97.5 F (36.4 C) (Oral)  Resp 18  Ht '5\' 3"'$  (1.6 m)  Wt 163 lb (73.936 kg)  BMI 28.88 kg/m2  SpO2 100%

## 2016-02-16 ENCOUNTER — Encounter (HOSPITAL_COMMUNITY): Payer: PPO | Attending: Hematology & Oncology | Admitting: Hematology & Oncology

## 2016-02-16 ENCOUNTER — Other Ambulatory Visit (HOSPITAL_COMMUNITY): Payer: Self-pay | Admitting: Hematology & Oncology

## 2016-02-16 ENCOUNTER — Encounter (HOSPITAL_COMMUNITY): Payer: Self-pay | Admitting: Hematology & Oncology

## 2016-02-16 ENCOUNTER — Encounter (HOSPITAL_COMMUNITY): Payer: PPO

## 2016-02-16 VITALS — BP 151/78 | HR 92 | Temp 97.8°F | Resp 16 | Wt 163.0 lb

## 2016-02-16 DIAGNOSIS — Z7982 Long term (current) use of aspirin: Secondary | ICD-10-CM | POA: Insufficient documentation

## 2016-02-16 DIAGNOSIS — K219 Gastro-esophageal reflux disease without esophagitis: Secondary | ICD-10-CM | POA: Insufficient documentation

## 2016-02-16 DIAGNOSIS — E7800 Pure hypercholesterolemia, unspecified: Secondary | ICD-10-CM | POA: Insufficient documentation

## 2016-02-16 DIAGNOSIS — C342 Malignant neoplasm of middle lobe, bronchus or lung: Secondary | ICD-10-CM | POA: Diagnosis not present

## 2016-02-16 DIAGNOSIS — Z9221 Personal history of antineoplastic chemotherapy: Secondary | ICD-10-CM | POA: Insufficient documentation

## 2016-02-16 DIAGNOSIS — Z79899 Other long term (current) drug therapy: Secondary | ICD-10-CM | POA: Insufficient documentation

## 2016-02-16 DIAGNOSIS — Z923 Personal history of irradiation: Secondary | ICD-10-CM | POA: Insufficient documentation

## 2016-02-16 DIAGNOSIS — Z853 Personal history of malignant neoplasm of breast: Secondary | ICD-10-CM | POA: Diagnosis not present

## 2016-02-16 DIAGNOSIS — Z90710 Acquired absence of both cervix and uterus: Secondary | ICD-10-CM | POA: Insufficient documentation

## 2016-02-16 DIAGNOSIS — Z1231 Encounter for screening mammogram for malignant neoplasm of breast: Secondary | ICD-10-CM

## 2016-02-16 DIAGNOSIS — I10 Essential (primary) hypertension: Secondary | ICD-10-CM | POA: Insufficient documentation

## 2016-02-16 DIAGNOSIS — C50911 Malignant neoplasm of unspecified site of right female breast: Secondary | ICD-10-CM

## 2016-02-16 DIAGNOSIS — Z9889 Other specified postprocedural states: Secondary | ICD-10-CM | POA: Insufficient documentation

## 2016-02-16 DIAGNOSIS — R590 Localized enlarged lymph nodes: Secondary | ICD-10-CM | POA: Insufficient documentation

## 2016-02-16 MED ORDER — SODIUM CHLORIDE 0.9% FLUSH
20.0000 mL | INTRAVENOUS | Status: DC | PRN
  Administered 2016-02-16: 20 mL via INTRAVENOUS
  Filled 2016-02-16: qty 20

## 2016-02-16 MED ORDER — HEPARIN SOD (PORK) LOCK FLUSH 100 UNIT/ML IV SOLN
INTRAVENOUS | Status: AC
  Filled 2016-02-16: qty 5

## 2016-02-16 MED ORDER — HEPARIN SOD (PORK) LOCK FLUSH 100 UNIT/ML IV SOLN
500.0000 [IU] | Freq: Once | INTRAVENOUS | Status: AC
  Administered 2016-02-16: 500 [IU] via INTRAVENOUS

## 2016-02-16 NOTE — Progress Notes (Signed)
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 ER +90%, PR 30% Her -2 neu indeterminate. Surgery, XRT, hormonal therapy. Intolerant of Tamoxifen, arimidex X 5 years completed 02/15/2005    Primary cancer of right middle lobe of lung ([place])   12/18/2014 Imaging CT abd/pelvis- New 6 mm right middle lobe pulmonary nodule with a 17 mm nodule identified in the left lung base, increased in size in the interval. Although growth of the left basilar nodule has been slow, neoplasm cannot be excluded.   12/31/2014 PET scan The 7 mm right middle lobe nodule is hypermetabolic, and there is considerable focal hypermetabolic activity along the posterior inferior margin of the right mainstem bronchus, presumably in the nonenlarged subcarinal lymph node in this vicinity...   03/19/2015 Imaging MRI brain- No evidence of metastatic disease to the brain or meninges.   06/30/2015 Imaging CT chest- Significant interval enlargement of right middle lobe 15 mm nodule (previously 7 mm) and 17 mm subcarinal lymph node. Stable lingular and anterior left lower lobe nodules.   07/15/2015 Procedure CT guided biopsy of right middle lobe nodule, by IR   07/15/2015 Pathology Results Lung, needle/core biopsy(ies), right middle lobe - POSITIVE FOR POORLY DIFFERENTIATED CARCINOMA   07/27/2015 Procedure Fiberoptic video bronchoscopy, endobronchial ultrasound- guided transbronchial biopsy of level 7 mediastinal lymph node - subcarinal lymph node by Dr. Tharon Aquas Tright.   07/27/2015 Pathology Results WANG NEEDLE, FINE NEEDLE ASPIRATION, 7 NODE #2, B (SPECIMEN 2 OF 2, COLLECTED ON 07/27/15): NO MALIGNANT CELLS IDENTIFIED. FINE NEEDLE ASPIRATION, ENDOSCOPIC SPECIMEN A, EBUS 7 NODE (SPECIMEN 1 OF 2, COLLECTED ON 07/27/2015):NO MALIGNANT CELLS IDENTIFIED.   09/08/2015 - 09/15/2015 Radiation Therapy Right middle lobe/ 54 Gy at 18 GY per fraction x 3  fractions by Dr. Pablo Ledger   10/27/2015 Imaging CT chest- response to therapy of the right middle lobe lung lesion. Only presumed radiation induced scarring remains. Progression of mediastinal adenopathy with probable developing right infrahilar nodal metastasis. Smaller pulmonary nodules are stable   11/08/2015 PET scan Prominent and hypermetabolic subcarinal and right infrahilar adenopathy compatible with malignancy, both the size and metabolic activity of these lymph nodes is significantly increased from April 2016.   11/08/2015 Relapse/Recurrence PET shows progressive disease   11/22/2015 - 12/27/2015 Chemotherapy Carboplatin/Paclitaxel weekly   11/23/2015 Imaging MRI brain- No acute or metastatic intracranial abnormality.  Stable MRI appearance of the brain since 2016.   11/24/2015 - 12/31/2015 Radiation Therapy Dr. Pablo Ledger, 60 gy in 30 fractions at 2 Gy per fraction   02/11/2016 Imaging Continued evolution of postradiation changes in RLL w/o findings to suggest local recurrence of disease or definite metastatic disease in thorax, resolution of previously noted mediastinal LAD     CURRENT THERAPY:  Observation  INTERVAL HISTORY: Kari Bullock 75 y.o. female returns today for additional f/u of a locally advanced NSCLC.  Patient followed by [facility] for Stage II infiltrating ductal carcinoma the right breast, grade 2 (T1 A., N1 a) with 2 of 4 positive lymph nodes, ER +90%, PR +30%, lymph node metastases were 1 mm or slightly less. HER-2/neu was indeterminant at 2+. She was diagnosed in February 2001 treated with surgery followed by radiation therapy and then hormonal therapy. She could not tolerate tamoxifen so was switched to Arimidex which she took for total 5 years ending on 02/15/2005, no evidence of disease.  She underwent XRT  with Dr. 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 Repeat imaging showed local  recurrence she has completed concurrent therapy and is here today for ongoing follow-up.   Kari Bullock is accompanied by her husband and daughter. I personally reviewed and went over imaging studies with the patient.  She is scheduled to see Dr. Pablo Ledger tomorrow.  She has received a letter to schedule a mammogram. She wanted to be sure she should do this.  The patient is requesting a port flush.  She is eating well, and believes she has put on a little weight. She denies any bowel or breathing issues. Every once in a while she has some abdominal pain, "though not enough to complain".   She denies CP, nausea, cough.   Past Medical History  Diagnosis Date  . Hypertension   . Breast cancer ([place])     2001 RT breat lumpectomy rad tx  . GERD (gastroesophageal reflux disease)   . Memory loss   . Pure hypercholesterolemia   . Memory change 10/24/2013  . Infiltrating ductal carcinoma of right breast ([place]) 01/09/2015  . Pulmonary nodule 01/09/2015  . Headache     years ago, none since hysterectomy  . Anemia     in the past  . Diarrhea   . Complication of anesthesia     confusion  . Lung cancer ([place]) 2017    has Memory change; Periumbilical pain; GERD (gastroesophageal reflux disease); Infiltrating ductal carcinoma of right breast ([place]); Diverticulosis of colon without hemorrhage; Chronic diarrhea; Mucosal abnormality of stomach; and Primary cancer of right middle lobe of lung ([place]) on her problem list.     is allergic to codeine; morphine and related; adhesive; sulfur; and tamoxifen.  [name] does not currently have medications on file.  Past Surgical History  Procedure Laterality Date  . Abdominal hysterectomy    . Bilateral oophorectomy  2007  . Incision and drainage of wound  2011    buttocks  . Tonsillectomy    . Cataract extraction Bilateral     lens implant  . Carpal tunnel release Right   . Colonoscopy  2011    RMR: 1. Anal papilla, otherwise normal rectum. 2.  Left-sided diverticula, single diminutive polyp in the sigmoid segment status post cold biospy removal. Remainder of the colonic mucosa appeared normal.   . Colonoscopy N/A 01/14/2015    Procedure: COLONOSCOPY;  Surgeon: Daneil Dolin, MD;  Location: [facility];  Service: Endoscopy;  Laterality: N/A;  930 - moved to 10:15  . Esophagogastroduodenoscopy N/A 01/14/2015    RMR: Colonic diverticulosis. status post segmental biospy and stool sampling.  No egd done today  . Breast surgery Right     right lumpectomy, lymph glands remove  . Video bronchoscopy with endobronchial ultrasound N/A 01/19/2015    Procedure: VIDEO BRONCHOSCOPY WITH ENDOBRONCHIAL ULTRASOUND;  Surgeon: Ivin Poot, MD;  Location: [facility] OR;  Service: Thoracic;  Laterality: N/A;  . Esophagogastroduodenoscopy N/A 02/11/2015    RMR: Focally abnormal antrum of doubtful clinical significance- Status post biopsy.   . Video bronchoscopy with endobronchial ultrasound N/A 07/27/2015    Procedure: VIDEO BRONCHOSCOPY WITH ENDOBRONCHIAL ULTRASOUND;  Surgeon: Ivin Poot, MD;  Location: [facility] OR;  Service: Thoracic;  Laterality: N/A;    Denies any headaches, dizziness, double vision, fevers, chills, night sweats, nausea, vomiting, diarrhea, constipation, chest pain, heart palpitations, shortness of breath, blood in stool, black  tarry stool, urinary pain, urinary burning, urinary frequency, hematuria, appetite loss.   14 point review of systems was performed and is negative except as detailed under history of present illness and above   PHYSICAL EXAMINATION  ECOG PERFORMANCE STATUS: 1 - Symptomatic but completely ambulatory   Vitals with BMI 02/16/2016  Height   Weight 163 lbs  BMI   Systolic 970  Diastolic 78  Pulse 92  Respirations 16    GENERAL:alert, no distress, well nourished, well developed, comfortable, cooperative, obese, smiling and accompanied by her husband and daughter. Able to get on examination table with  assistance. SKIN: skin color, texture, turgor are normal, no rashes or significant lesions. HEAD: Normocephalic, No masses, lesions, tenderness or abnormalities EYES: normal, PERRLA, EOMI, Conjunctiva are pink and non-injected EARS: External ears normal OROPHARYNX:lips, buccal mucosa, and tongue normal and mucous membranes are moist  NECK: supple, no adenopathy, thyroid normal size, non-tender, without nodularity, trachea midline LYMPH:  no palpable lymphadenopathy BREAST:not examined LUNGS: clear to auscultation  HEART: regular rate & rhythm, no murmurs, no gallops, S1 normal and S2 normal ABDOMEN:abdomen soft, non-tender, obese and normal bowel sounds BACK: Back symmetric, no curvature., No CVA tenderness EXTREMITIES:less then 2 second capillary refill, no joint deformities, effusion, or inflammation, no skin discoloration, no clubbing, no cyanosis  NEURO: alert & oriented x 3 with fluent speech, no focal motor/sensory deficits, gait normal  LABORATORY DATA: I have reviewed the data as listed. CBC    Component Value Date/Time   WBC 3.4* 12/27/2015 0830   RBC 4.09 12/27/2015 0830   HGB 12.0 12/27/2015 0830   HCT 36.3 12/27/2015 0830   PLT 101* 12/27/2015 0830   MCV 88.8 12/27/2015 0830   MCH 29.3 12/27/2015 0830   MCHC 33.1 12/27/2015 0830   RDW 14.4 12/27/2015 0830   LYMPHSABS 0.5* 12/27/2015 0830   MONOABS 0.3 12/27/2015 0830   EOSABS 0.0 12/27/2015 0830   BASOSABS 0.0 12/27/2015 0830      Chemistry      Component Value Date/Time   NA 139 12/27/2015 0830   K 3.5 12/27/2015 0830   CL 105 12/27/2015 0830   CO2 27 12/27/2015 0830   BUN 15 12/27/2015 0830   CREATININE 0.66 12/27/2015 0830   CREATININE 0.69 12/16/2014 1058      Component Value Date/Time   CALCIUM 9.5 12/27/2015 0830   ALKPHOS 52 12/27/2015 0830   AST 23 12/27/2015 0830   ALT 16 12/27/2015 0830   BILITOT 0.5 12/27/2015 0830     RADIOGRAPHY: I have personally reviewed the radiological images as  listed and agreed with the findings in the report.   Study Result     CLINICAL DATA: 75 year old female with history of right middle lobe lung cancer, status post chemotherapy and radiation therapy additional history of infiltrating ductal carcinoma of the right breast. Followup study.  EXAM: CT CHEST WITHOUT CONTRAST  TECHNIQUE: Multidetector CT imaging of the chest was performed following the standard protocol without IV contrast.  COMPARISON: Multiple priors, most recently PET-CT 11/08/2015. Chest CT 10/27/2015.  FINDINGS: Mediastinum/Lymph Nodes: Heart size is normal. There is no significant pericardial fluid, thickening or pericardial calcification. There is atherosclerosis of the thoracic aorta, the great vessels of the mediastinum and the coronary arteries, including calcified atherosclerotic plaque in the left anterior descending and right coronary arteries. No pathologically enlarged mediastinal or hilar lymph nodes. Specifically, subcarinal lymph node currently measures 6 mm (previously 20 mm). Esophagus is unremarkable in appearance. No axillary lymphadenopathy. Left-sided internal  jugular single-lumen porta cath with tip terminating in the distal superior vena cava.  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 More mature postradiation changes are noted in the apex of the right lung, potentially related to prior right axillary radiation. The original nodule in the right middle lobe is no longer clearly visualized, with only a small area of linear scarring in its wake (image 100 of series 3), similar to prior examinations. Previously noted pleural-based nodule in the anterior aspect of the inferior  left lower lobe is similar to prior examinations measuring 10 x 8 mm on today's study (image 135 of series 3), previously benign by biopsy. 6 mm left upper lobe nodule (image 86 of series 3) is similar in size to prior PET-CT 12/31/2014, favored to be benign. A few tiny 2-3 mm nodules are noted throughout the periphery of the lungs bilaterally, nonspecific, but likely to represent areas of benign mucoid impaction. No other new suspicious appearing pulmonary nodules or masses are noted. There is mild diffuse bronchial wall thickening with mild centrilobular emphysema. No acute consolidative airspace disease. No pleural effusions.  Upper Abdomen: Atherosclerosis.  Musculoskeletal/Soft Tissues: There are no aggressive appearing lytic or blastic lesions noted in the visualized portions of the skeleton.  IMPRESSION: 1. Continued evolution of postradiation changes in the right lower lung, without findings to suggest local recurrence of disease or definite metastatic disease in the thorax. 2. Positive response to therapy as evidenced by resolution of previously noted mediastinal lymphadenopathy. 3. Atherosclerosis, including 2 vessel coronary artery disease. Assessment for potential risk factor modification, dietary therapy or pharmacologic therapy may be warranted, if clinically indicated. 4. Mild diffuse bronchial wall thickening with mild centrilobular and paraseptal emphysema; imaging findings suggestive of underlying COPD.   Electronically Signed  By: Vinnie Langton M.D.  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 These show a positive response to treatment. She is doing quite well. Appetite has returned, energy is back to baseline.  She is scheduled to see Dr. Pablo Ledger of radiation oncology tomorrow.  The patient will schedule her mammogram, she is currently due. She will receive a port flush today.   She will return for follow up in 3 months with repeat CT scans.   All questions were answered. The patient knows to call the clinic with any problems, questions or concerns. We can certainly see the patient much sooner if necessary.  This document serves as a record of services personally performed by Ancil Linsey, MD. It was created on her behalf by Arlyce Harman, a trained medical scribe. The creation of this record is based on the scribe's personal observations and the provider's statements to them. This document has been checked and  approved by the attending provider.  I have reviewed the above documentation for accuracy and completeness, and I agree with the above. Molli Hazard, MD

## 2016-02-16 NOTE — Progress Notes (Signed)
Kari Bullock presented for Portacath access and flush. Proper placement of portacath confirmed by CXR. Portacath located left chest wall accessed with  H 20 needle. Good blood return present. Portacath flushed with 52m NS and 500U/518mHeparin and needle removed intact. Procedure without incident. Patient tolerated procedure well.

## 2016-02-16 NOTE — Patient Instructions (Signed)
[facility] at [facility] Discharge Instructions  RECOMMENDATIONS MADE BY THE CONSULTANT AND ANY TEST RESULTS WILL BE SENT TO YOUR REFERRING PHYSICIAN.  Port flush today.    Thank you for choosing [facility] at [facility] to provide your oncology and hematology care.  To afford each patient quality time with our provider, please arrive at least 15 minutes before your scheduled appointment time.   Beginning January 23rd 2017 lab work for the Cancer Center will be done in the  Main lab at [facility] on 1st floor. If you have a lab appointment with the Cancer Center please come in thru the  Main Entrance and check in at the main information desk  You need to re-schedule your appointment should you arrive 10 or more minutes late.  We strive to give you quality time with our providers, and arriving late affects you and other patients whose appointments are after yours.  Also, if you no show three or more times for appointments you may be dismissed from the clinic at the providers discretion.     Again, thank you for choosing [facility].  Our hope is that these requests will decrease the amount of time that you wait before being seen by our physicians.       _____________________________________________________________  Should you have questions after your visit to [facility], please contact our office at [phone] between the hours of 8:30 a.m. and 4:30 p.m.  Voicemails left after 4:30 p.m. will not be returned until the following business day.  For prescription refill requests, have your pharmacy contact our office.         Resources For Cancer Patients and their Caregivers ? American Cancer Society: Can assist with transportation, wigs, general needs, runs Look Good Feel Better.        1-888-227-6333 ? Cancer Care: Provides financial assistance, online support groups, medication/co-pay assistance.   1-800-813-HOPE (4673) ? [facility] Assists [place] cancer patients and their families through emotional , educational and financial support.  336-427-4357 ? [place] DSS Where to apply for food stamps, Medicaid and utility assistance. 336-342-1394 ? RCATS: Transportation to medical appointments. 336-347-2287 ? Social Security Administration: May apply for disability if have a Stage IV cancer. 336-342-7796 1-800-772-1213 ? [place] Aging, Disability and Transit Services: Assists with nutrition, care and transit needs. 336-349-2343  Cancer Center Support Programs: @10RELATIVEDAYS@ > Cancer Support Group  2nd Tuesday of the month 1pm-2pm, Journey Room  > Creative Journey  3rd Tuesday of the month 1130am-1pm, Journey Room  > Look Good Feel Better  1st Wednesday of the month 10am-12 noon, Journey Room (Call American Cancer [employer] to register 1-800-395-5775)    

## 2016-02-16 NOTE — Patient Instructions (Signed)
[place] at [facility] Discharge Instructions  RECOMMENDATIONS MADE BY THE CONSULTANT AND ANY TEST RESULTS WILL BE SENT TO YOUR REFERRING PHYSICIAN.  Return in 3 months with lab work.  CT scan prior to arrival.   Return for port flushes .   Thank you for choosing [place] at [facility] to provide your oncology and hematology care.  To afford each patient quality time with our provider, please arrive at least 15 minutes before your scheduled appointment time.   Beginning January 23rd 2017 lab work for the [facility] will be done in the  Main lab at [facility] on 1st floor. If you have a lab appointment with the [facility] please come in thru the  Main Entrance and check in at the main information desk  You need to re-schedule your appointment should you arrive 10 or more minutes late.  We strive to give you quality time with our providers, and arriving late affects you and other patients whose appointments are after yours.  Also, if you no show three or more times for appointments you may be dismissed from the clinic at the providers discretion.     Again, thank you for choosing [facility].  Our hope is that these requests will decrease the amount of time that you wait before being seen by our physicians.       _____________________________________________________________  Should you have questions after your visit to [facility], please contact our office at [phone] between the hours of 8:30 a.m. and 4:30 p.m.  Voicemails left after 4:30 p.m. will not be returned until the following business day.  For prescription refill requests, have your pharmacy contact our office.         Resources For Cancer Patients and their Caregivers ? American Cancer Society: Can assist with transportation, wigs, general needs, runs Look Good Feel Better.        520-359-1605 ? Cancer Care: Provides financial  assistance, online support groups, medication/co-pay assistance.  1-800-813-HOPE 531-669-9045) ? [facility] Assists [place] cancer patients and their families through emotional , educational and financial support.  (475)621-0779 ? [place] DSS Where to apply for food stamps, Medicaid and utility assistance. 510-656-4124 ? RCATS: Transportation to medical appointments. 520 819 5401 ? Social Security Administration: May apply for disability if have a Stage IV cancer. (506)051-9889 251-193-8399 ? [employer], Disability and Transit Services: Assists with nutrition, care and transit needs. [place] Support Programs: '@10RELATIVEDAYS'$ @ > Cancer Support Group  2nd Tuesday of the month 1pm-2pm, Journey Room  > Creative Journey  3rd Tuesday of the month 1130am-1pm, Journey Room  > Look Good Feel Better  1st Wednesday of the month 10am-12 noon, Journey Room (Call [place] to register [phone])

## 2016-02-17 ENCOUNTER — Encounter: Payer: Self-pay | Admitting: Radiation Oncology

## 2016-02-17 ENCOUNTER — Ambulatory Visit
Admission: RE | Admit: 2016-02-17 | Discharge: 2016-02-17 | Disposition: A | Discharge status: Home / Self Care | Payer: PPO | Source: Ambulatory Visit | Attending: Radiation Oncology | Admitting: Radiation Oncology

## 2016-02-17 VITALS — BP 128/75 | HR 90 | Temp 97.5°F | Resp 18 | Ht 63.0 in | Wt 163.0 lb

## 2016-02-17 DIAGNOSIS — C342 Malignant neoplasm of middle lobe, bronchus or lung: Secondary | ICD-10-CM | POA: Diagnosis not present

## 2016-02-17 DIAGNOSIS — C50911 Malignant neoplasm of unspecified site of right female breast: Secondary | ICD-10-CM | POA: Diagnosis not present

## 2016-02-17 NOTE — Addendum Note (Signed)
Encounter addended by: Malena Edman, RN on: 02/17/2016  3:36 PM<BR>     Documentation filed: Charges VN

## 2016-02-17 NOTE — Progress Notes (Signed)
   Department of Radiation Oncology  Phone:  [phone] Fax:        [phone]   Name: Kari Bullock MRN: [id number]  DOB: 1941-03-11  Date: 02/17/2016  Follow Up Visit Note  Diagnosis: Infiltrating ductal carcinoma of right breast [facility])   Staging form: Breast, AJCC 7th Edition     Clinical: Stage IIA (T1a, N1, M0) - Signed by Baird Cancer, PA-C on 01/09/2015 Primary cancer of right middle lobe of lung [facility])   Staging form: Lung, AJCC 7th Edition     Clinical stage from 12/31/2014: Stage IA (T1a, N0, M0) - Signed by Baird Cancer, PA-C on 12/12/2015     Pathologic stage from 11/08/2015: Stage IIIA (T1a, N2, cM0) - Signed by Baird Cancer, PA-C on 12/12/2015  Radiation treatment dates: 11/22/2015-12/31/2015   Site/dose: The Right lung / mediastinum was treated to 60 Gy in 30 fractions at 2 Gy per fraction.   Interval History:   Kari Bullock is here for a one month for follow up visit for cancer of right middle lobe of lung.  Patient is doing well. She denies SOB, cough is nonproductive, denies hemoptysis, denies swallowing issues, denies pain. Her appetite is good and has no fatigue. Her last chemotherapy was 12/27/15 Carboplatin and Taxol.   02/11/16 CT chest indicated no evidence of local recurrence of disease or metastatic disease in the thorax. Overall positive response to therapy.  Physical Exam:  Filed Vitals:   02/17/16 1311  BP: 128/75  Pulse: 90  Temp: 97.5 F (36.4 C)  TempSrc: Oral  Resp: 18  Height: '5\' 3"'$  (1.6 m)  Weight: 163 lb (73.936 kg)  SpO2: 100%    Weight changes, if any:  Wt Readings from Last 3 Encounters:  02/17/16 163 lb (73.936 kg)  02/16/16 163 lb (73.936 kg)  01/13/16 165 lb 3.2 oz (74.934 kg)    IMPRESSION: Kari Bullock is a 75 y.o. female diagnosed with right breast cancer and right lung cancer. Her 02/11/16 CT has shown a positive response to therapy with no evidence of disease recurrence.  PLAN:  She can always call me with  questions.  I will follow up with her on an as needed basis. Continued follow up with Dr.Penland in 3 months with repeat CT.  ------------------------------------------------  Thea Silversmith, MD  This document serves as a record of services personally performed by Thea Silversmith, MD. It was created on her behalf by Derek Mound, a trained medical scribe. The creation of this record is based on the scribe's personal observations and the provider's statements to them. This document has been checked and approved by the attending provider.

## 2016-02-18 ENCOUNTER — Ambulatory Visit (HOSPITAL_COMMUNITY)
Admission: RE | Admit: 2016-02-18 | Discharge: 2016-02-18 | Disposition: A | Discharge status: Home / Self Care | Payer: PPO | Source: Ambulatory Visit | Attending: Hematology & Oncology | Admitting: Hematology & Oncology

## 2016-02-18 DIAGNOSIS — R928 Other abnormal and inconclusive findings on diagnostic imaging of breast: Secondary | ICD-10-CM | POA: Insufficient documentation

## 2016-02-18 DIAGNOSIS — Z1231 Encounter for screening mammogram for malignant neoplasm of breast: Secondary | ICD-10-CM | POA: Diagnosis not present

## 2016-02-20 ENCOUNTER — Other Ambulatory Visit: Payer: Self-pay | Admitting: Gastroenterology

## 2016-02-23 ENCOUNTER — Other Ambulatory Visit (HOSPITAL_COMMUNITY): Payer: Self-pay | Admitting: Hematology & Oncology

## 2016-02-23 DIAGNOSIS — R928 Other abnormal and inconclusive findings on diagnostic imaging of breast: Secondary | ICD-10-CM

## 2016-03-07 ENCOUNTER — Ambulatory Visit (HOSPITAL_COMMUNITY)
Admission: RE | Admit: 2016-03-07 | Discharge: 2016-03-07 | Disposition: A | Discharge status: Home / Self Care | Payer: PPO | Source: Ambulatory Visit | Attending: Hematology & Oncology | Admitting: Hematology & Oncology

## 2016-03-07 DIAGNOSIS — N6002 Solitary cyst of left breast: Secondary | ICD-10-CM | POA: Insufficient documentation

## 2016-03-07 DIAGNOSIS — N63 Unspecified lump in breast: Secondary | ICD-10-CM | POA: Diagnosis not present

## 2016-03-07 DIAGNOSIS — R928 Other abnormal and inconclusive findings on diagnostic imaging of breast: Secondary | ICD-10-CM

## 2016-04-12 ENCOUNTER — Encounter (HOSPITAL_COMMUNITY): Payer: PPO | Attending: Hematology & Oncology

## 2016-04-12 DIAGNOSIS — Z452 Encounter for adjustment and management of vascular access device: Secondary | ICD-10-CM | POA: Diagnosis not present

## 2016-04-12 DIAGNOSIS — I10 Essential (primary) hypertension: Secondary | ICD-10-CM | POA: Diagnosis not present

## 2016-04-12 DIAGNOSIS — R590 Localized enlarged lymph nodes: Secondary | ICD-10-CM | POA: Diagnosis not present

## 2016-04-12 DIAGNOSIS — Z923 Personal history of irradiation: Secondary | ICD-10-CM | POA: Diagnosis not present

## 2016-04-12 DIAGNOSIS — Z90710 Acquired absence of both cervix and uterus: Secondary | ICD-10-CM | POA: Insufficient documentation

## 2016-04-12 DIAGNOSIS — C342 Malignant neoplasm of middle lobe, bronchus or lung: Secondary | ICD-10-CM | POA: Insufficient documentation

## 2016-04-12 DIAGNOSIS — Z9221 Personal history of antineoplastic chemotherapy: Secondary | ICD-10-CM | POA: Diagnosis not present

## 2016-04-12 DIAGNOSIS — Z853 Personal history of malignant neoplasm of breast: Secondary | ICD-10-CM | POA: Insufficient documentation

## 2016-04-12 DIAGNOSIS — Z79899 Other long term (current) drug therapy: Secondary | ICD-10-CM | POA: Insufficient documentation

## 2016-04-12 DIAGNOSIS — C50911 Malignant neoplasm of unspecified site of right female breast: Secondary | ICD-10-CM

## 2016-04-12 DIAGNOSIS — Z7982 Long term (current) use of aspirin: Secondary | ICD-10-CM | POA: Diagnosis not present

## 2016-04-12 DIAGNOSIS — E7800 Pure hypercholesterolemia, unspecified: Secondary | ICD-10-CM | POA: Diagnosis not present

## 2016-04-12 DIAGNOSIS — Z9889 Other specified postprocedural states: Secondary | ICD-10-CM | POA: Insufficient documentation

## 2016-04-12 DIAGNOSIS — K219 Gastro-esophageal reflux disease without esophagitis: Secondary | ICD-10-CM | POA: Diagnosis not present

## 2016-04-12 LAB — CBC WITH DIFFERENTIAL/PLATELET
BASOS ABS: 0 10*3/uL (ref 0.0–0.1)
Basophils Relative: 1 %
EOS ABS: 0.2 10*3/uL (ref 0.0–0.7)
EOS PCT: 4 %
HCT: 38.5 % (ref 36.0–46.0)
Hemoglobin: 12.5 g/dL (ref 12.0–15.0)
Lymphocytes Relative: 16 %
Lymphs Abs: 1 10*3/uL (ref 0.7–4.0)
MCH: 28.2 pg (ref 26.0–34.0)
MCHC: 32.5 g/dL (ref 30.0–36.0)
MCV: 86.9 fL (ref 78.0–100.0)
Monocytes Absolute: 0.6 10*3/uL (ref 0.1–1.0)
Monocytes Relative: 9 %
NEUTROS PCT: 70 %
Neutro Abs: 4.2 10*3/uL (ref 1.7–7.7)
PLATELETS: 170 10*3/uL (ref 150–400)
RBC: 4.43 MIL/uL (ref 3.87–5.11)
RDW: 14 % (ref 11.5–15.5)
WBC: 6 10*3/uL (ref 4.0–10.5)

## 2016-04-12 LAB — COMPREHENSIVE METABOLIC PANEL
ALT: 17 U/L (ref 14–54)
AST: 21 U/L (ref 15–41)
Albumin: 3.8 g/dL (ref 3.5–5.0)
Alkaline Phosphatase: 65 U/L (ref 38–126)
Anion gap: 4 — ABNORMAL LOW (ref 5–15)
BILIRUBIN TOTAL: 0.4 mg/dL (ref 0.3–1.2)
BUN: 13 mg/dL (ref 6–20)
CO2: 27 mmol/L (ref 22–32)
CREATININE: 0.72 mg/dL (ref 0.44–1.00)
Calcium: 9.7 mg/dL (ref 8.9–10.3)
Chloride: 108 mmol/L (ref 101–111)
Glucose, Bld: 104 mg/dL — ABNORMAL HIGH (ref 65–99)
POTASSIUM: 3.8 mmol/L (ref 3.5–5.1)
Sodium: 139 mmol/L (ref 135–145)
TOTAL PROTEIN: 6.5 g/dL (ref 6.5–8.1)

## 2016-04-12 MED ORDER — HEPARIN SOD (PORK) LOCK FLUSH 100 UNIT/ML IV SOLN
INTRAVENOUS | Status: AC
  Filled 2016-04-12: qty 5

## 2016-04-12 MED ORDER — HEPARIN SOD (PORK) LOCK FLUSH 100 UNIT/ML IV SOLN
500.0000 [IU] | Freq: Once | INTRAVENOUS | Status: AC
  Administered 2016-04-12: 500 [IU] via INTRAVENOUS

## 2016-04-12 MED ORDER — SODIUM CHLORIDE 0.9% FLUSH
20.0000 mL | INTRAVENOUS | Status: DC | PRN
  Administered 2016-04-12: 20 mL via INTRAVENOUS
  Filled 2016-04-12: qty 20

## 2016-04-12 NOTE — Progress Notes (Signed)
Kari Bullock presented for Portacath access and flush. Proper placement of portacath confirmed by CXR. Portacath located left chest wall accessed with  H 20 needle. Good blood return present.  Specimen drawn for labs.   Portacath flushed with 80m NS and 500U/547mHeparin and needle removed intact. Procedure without incident. Patient tolerated procedure well.

## 2016-04-12 NOTE — Patient Instructions (Signed)
[place] at [facility] Discharge Instructions  RECOMMENDATIONS MADE BY THE CONSULTANT AND ANY TEST RESULTS WILL BE SENT TO YOUR REFERRING PHYSICIAN.  Port flush with labs today.    Thank you for choosing [place] at [facility] to provide your oncology and hematology care.  To afford each patient quality time with our provider, please arrive at least 15 minutes before your scheduled appointment time.   Beginning January 23rd 2017 lab work for the [facility] will be done in the  Main lab at [facility] on 1st floor. If you have a lab appointment with the [facility] please come in thru the  Main Entrance and check in at the main information desk  You need to re-schedule your appointment should you arrive 10 or more minutes late.  We strive to give you quality time with our providers, and arriving late affects you and other patients whose appointments are after yours.  Also, if you no show three or more times for appointments you may be dismissed from the clinic at the providers discretion.     Again, thank you for choosing [facility].  Our hope is that these requests will decrease the amount of time that you wait before being seen by our physicians.       _____________________________________________________________  Should you have questions after your visit to [facility], please contact our office at [phone] between the hours of 8:30 a.m. and 4:30 p.m.  Voicemails left after 4:30 p.m. will not be returned until the following business day.  For prescription refill requests, have your pharmacy contact our office.         Resources For Cancer Patients and their Caregivers ? American Cancer Society: Can assist with transportation, wigs, general needs, runs Look Good Feel Better.        [phone] ? Cancer Care: Provides financial assistance, online support groups, medication/co-pay assistance.   1-800-813-HOPE (240) 865-1435) ? [place] Assists [place] cancer patients and their families through emotional , educational and financial support.  604-595-9014 ? [place] DSS Where to apply for food stamps, Medicaid and utility assistance. 270-563-2254 ? RCATS: Transportation to medical appointments. (989)751-5739 ? Social Security Administration: May apply for disability if have a Stage IV cancer. ([phone] 503-133-5601 ? [employer], Disability and Transit Services: Assists with nutrition, care and transit needs. [place] Support Programs: '@10RELATIVEDAYS'$ @ > Cancer Support Group  2nd Tuesday of the month 1pm-2pm, Journey Room  > Creative Journey  3rd Tuesday of the month 1130am-1pm, Journey Room  > Look Good Feel Better  1st Wednesday of the month 10am-12 noon, Journey Room (Call Altha to register [phone])

## 2016-05-09 ENCOUNTER — Ambulatory Visit (HOSPITAL_COMMUNITY)
Admission: RE | Admit: 2016-05-09 | Discharge: 2016-05-09 | Disposition: A | Discharge status: Home / Self Care | Payer: PPO | Source: Ambulatory Visit | Attending: Hematology & Oncology | Admitting: Hematology & Oncology

## 2016-05-09 ENCOUNTER — Other Ambulatory Visit: Payer: PPO

## 2016-05-09 DIAGNOSIS — K5730 Diverticulosis of large intestine without perforation or abscess without bleeding: Secondary | ICD-10-CM | POA: Insufficient documentation

## 2016-05-09 DIAGNOSIS — J90 Pleural effusion, not elsewhere classified: Secondary | ICD-10-CM | POA: Insufficient documentation

## 2016-05-09 DIAGNOSIS — M5137 Other intervertebral disc degeneration, lumbosacral region: Secondary | ICD-10-CM | POA: Insufficient documentation

## 2016-05-09 DIAGNOSIS — N8110 Cystocele, unspecified: Secondary | ICD-10-CM | POA: Diagnosis not present

## 2016-05-09 DIAGNOSIS — J432 Centrilobular emphysema: Secondary | ICD-10-CM | POA: Insufficient documentation

## 2016-05-09 DIAGNOSIS — C342 Malignant neoplasm of middle lobe, bronchus or lung: Secondary | ICD-10-CM | POA: Diagnosis not present

## 2016-05-09 DIAGNOSIS — Z923 Personal history of irradiation: Secondary | ICD-10-CM | POA: Diagnosis not present

## 2016-05-09 DIAGNOSIS — I700 Atherosclerosis of aorta: Secondary | ICD-10-CM | POA: Diagnosis not present

## 2016-05-09 DIAGNOSIS — C3411 Malignant neoplasm of upper lobe, right bronchus or lung: Secondary | ICD-10-CM | POA: Diagnosis not present

## 2016-05-09 MED ORDER — IOPAMIDOL (ISOVUE-300) INJECTION 61%
100.0000 mL | Freq: Once | INTRAVENOUS | Status: AC | PRN
  Administered 2016-05-09: 100 mL via INTRAVENOUS

## 2016-05-11 ENCOUNTER — Ambulatory Visit (INDEPENDENT_AMBULATORY_CARE_PROVIDER_SITE_OTHER): Payer: PPO | Admitting: Adult Health

## 2016-05-11 ENCOUNTER — Encounter: Payer: Self-pay | Admitting: Adult Health

## 2016-05-11 VITALS — BP 143/79 | HR 90 | Ht 63.0 in | Wt 158.6 lb

## 2016-05-11 DIAGNOSIS — R413 Other amnesia: Secondary | ICD-10-CM | POA: Diagnosis not present

## 2016-05-11 MED ORDER — DONEPEZIL HCL 10 MG PO TABS: 10.0000 mg | ORAL_TABLET | Freq: Every day | ORAL | 3 refills | Status: DC

## 2016-05-11 NOTE — Progress Notes (Signed)
I have read the note, and I agree with the clinical assessment and plan.  WILLIS,CHARLES KEITH   

## 2016-05-11 NOTE — Progress Notes (Signed)
PATIENT: Kari Bullock DOB: 05-17-1941  REASON FOR VISIT: follow up- memory disorder HISTORY FROM: patient  HISTORY OF PRESENT ILLNESS: Kari Bullock is a 75 year old female with a history of mild memory disorder. She returns today for follow-up. She remains on Aricept and is tolerating it well. The patient states that she continues to live at home with her husband. Able to complete all ADLs independently. She does not operate a motor vehicle. States that she sleeps well at night. Good appetite. Denies any hallucinations. Daughter is with her today and reports that time she does become agitated easily. She was recently treated for lung cancer and is now in remission. She denies any new neurological symptoms. He returns today for an evaluation.  HISTORY 427/17: Kari Bullock is a 75 year old female with a history of mild memory disorder. She returns today for follow-up. The patient is currently on Aricept and is tolerating it well. She feels that her memory has remained the same. She continues to live at home with her husband. She is able to complete all ADLs independentely. She operates a vehicle without difficulty. She prepare meals and completes finances without difficulty. She states that her and her husband continue to mow the grass at her church. She states that she was diagnosed with lung cancer and has undergone chemotherapy and radiation. She reports that she has a follow-up scan in 4-5 weeks. She returns today for an evaluation.  REVIEW OF SYSTEMS: Out of a complete 14 system review of symptoms, the patient complains only of the following symptoms, and all other reviewed systems are negative.  Back pain, aching muscles, nervous/anxious, headache, numbness  ALLERGIES: Allergies  Allergen Reactions  . Codeine Nausea And Vomiting  . Morphine And Related Itching  . Adhesive [Tape] Rash    Please use paper tape  . Sulfur Rash    Vomitting  . Tamoxifen Other (See Comments)    Blurred  vision and dizziness    HOME MEDICATIONS: Outpatient Medications Prior to Visit  Medication Sig Dispense Refill  . aspirin EC 81 MG tablet Take 81 mg by mouth daily.    . cholecalciferol (VITAMIN D) 1000 UNITS tablet Take 1,000 Units by mouth at bedtime. D3    . donepezil (ARICEPT) 10 MG tablet Take 1 tablet (10 mg total) by mouth at bedtime. 90 tablet 3  . losartan-hydrochlorothiazide (HYZAAR) 50-12.5 MG per tablet Take 1 tablet by mouth daily.     . naproxen sodium (ANAPROX) 220 MG tablet Take 220 mg by mouth 2 (two) times daily as needed (pain). ALEVE    . omeprazole (PRILOSEC) 20 MG capsule TAKE 1 CAPSULE(20 MG) BY MOUTH TWICE DAILY BEFORE A MEAL 60 capsule 5  . lidocaine-prilocaine (EMLA) cream Apply a quarter size amount to port site 1 hour prior to chemo. Do not rub in. Cover with plastic wrap. (Patient not taking: Reported on 05/11/2016) 30 g 3   No facility-administered medications prior to visit.     PAST MEDICAL HISTORY: Past Medical History:  Diagnosis Date  . Anemia    in the past  . Breast cancer ([place])    2001 RT breat lumpectomy rad tx  . Complication of anesthesia    confusion  . Diarrhea   . GERD (gastroesophageal reflux disease)   . Headache    years ago, none since hysterectomy  . Hypertension   . 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 Lung cancer ([place]) 2017  . Memory change 10/24/2013  .  Memory loss   . Pulmonary nodule 01/09/2015  . Pure hypercholesterolemia     PAST SURGICAL HISTORY: Past Surgical History:  Procedure Laterality Date  . ABDOMINAL HYSTERECTOMY    . BILATERAL OOPHORECTOMY  2007  . BREAST SURGERY Right    right lumpectomy, lymph glands remove  . CARPAL TUNNEL RELEASE Right   . CATARACT EXTRACTION Bilateral    lens implant  . COLONOSCOPY  2011   RMR: 1. Anal papilla, otherwise normal rectum. 2. Left-sided diverticula, single diminutive polyp in the sigmoid segment status post cold biospy removal. Remainder of  the colonic mucosa appeared normal.   . COLONOSCOPY N/A 01/14/2015   Procedure: COLONOSCOPY;  Surgeon: Daneil Dolin, MD;  Location: [facility];  Service: Endoscopy;  Laterality: N/A;  930 - moved to 10:15  . ESOPHAGOGASTRODUODENOSCOPY N/A 01/14/2015   RMR: Colonic diverticulosis. status post segmental biospy and stool sampling.  No egd done today  . ESOPHAGOGASTRODUODENOSCOPY N/A 02/11/2015   RMR: Focally abnormal antrum of doubtful clinical significance- Status post biopsy.   . INCISION AND DRAINAGE OF WOUND  2011   buttocks  . TONSILLECTOMY    . VIDEO BRONCHOSCOPY WITH ENDOBRONCHIAL ULTRASOUND N/A 01/19/2015   Procedure: VIDEO BRONCHOSCOPY WITH ENDOBRONCHIAL ULTRASOUND;  Surgeon: Ivin Poot, MD;  Location: [facility];  Service: Thoracic;  Laterality: N/A;  . VIDEO BRONCHOSCOPY WITH ENDOBRONCHIAL ULTRASOUND N/A 07/27/2015   Procedure: VIDEO BRONCHOSCOPY WITH ENDOBRONCHIAL ULTRASOUND;  Surgeon: Ivin Poot, MD;  Location: [facility] OR;  Service: Thoracic;  Laterality: N/A;    FAMILY HISTORY: Family History  Problem Relation Age of Onset  . Stroke Mother   . Breast cancer Maternal Aunt   . Breast cancer Maternal Grandmother   . Heart attack Father   . Pancreatic cancer Brother     SOCIAL HISTORY: Social History   Social History  . Marital status: Married    Spouse name: N/A  . Number of children: 3  . Years of education: 12   Occupational History  . Retired Retired   Social History Main Topics  . Smoking status: Former Smoker    Packs/day: 1.00    Years: 20.00    Types: Cigarettes    Quit date: 11/20/1999  . Smokeless tobacco: Never Used  . Alcohol use No     Comment: occasional wine  . Drug use: No  . Sexual activity: Not on file   Other Topics Concern  . Not on file   Social History Narrative  . No narrative on file      PHYSICAL EXAM  Vitals:   05/11/16 1248  BP: (!) 143/79  Pulse: 90  Weight: 158 lb 9.6 oz (71.9 kg)  Height: '5\' 3"'$  (1.6 m)   Body mass  index is 28.09 kg/m.   MMSE - Mini Mental State Exam 05/11/2016 01/13/2016 06/28/2015  Orientation to time '4 4 5  '$ Orientation to Place '5 4 5  '$ Registration '3 3 3  '$ Attention/ Calculation '2 4 3  '$ Recall '1 1 3  '$ Language- name 2 objects '2 2 2  '$ Language- repeat '1 1 1  '$ Language- follow 3 step command '3 3 3  '$ Language- read & follow direction '1 1 1  '$ Write a sentence 1 0 1  Copy design '1 1 1  '$ Total score '24 24 28     '$ Generalized: Well developed, in no acute distress   Neurological examination  Mentation: Alert. Follows all commands speech and language fluent Cranial nerve II-XII: Pupils were equal round reactive  to light. Extraocular movements were full, visual field were full on confrontational test. Facial sensation and strength were normal. Uvula tongue midline. Head turning and shoulder shrug  were normal and symmetric. Motor: The motor testing reveals 5 over 5 strength of all 4 extremities. Good symmetric motor tone is noted throughout.  Sensory: Sensory testing is intact to soft touch on all 4 extremities. No evidence of extinction is noted.  Coordination: Cerebellar testing reveals good finger-nose-finger and heel-to-shin bilaterally.  Gait and station: Gait is normal. Reflexes: Deep tendon reflexes are symmetric and normal bilaterally.   DIAGNOSTIC DATA (LABS, IMAGING, TESTING) - I reviewed patient records, labs, notes, testing and imaging myself where available.  Lab Results  Component Value Date   WBC 6.0 04/12/2016   HGB 12.5 04/12/2016   HCT 38.5 04/12/2016   MCV 86.9 04/12/2016   PLT 170 04/12/2016      Component Value Date/Time   NA 139 04/12/2016 1455   K 3.8 04/12/2016 1455   CL 108 04/12/2016 1455   CO2 27 04/12/2016 1455   GLUCOSE 104 (H) 04/12/2016 1455   BUN 13 04/12/2016 1455   CREATININE 0.72 04/12/2016 1455   CREATININE 0.69 12/16/2014 1058   CALCIUM 9.7 04/12/2016 1455   PROT 6.5 04/12/2016 1455   ALBUMIN 3.8 04/12/2016 1455   AST 21 04/12/2016 1455    ALT 17 04/12/2016 1455   ALKPHOS 65 04/12/2016 1455   BILITOT 0.4 04/12/2016 1455   GFRNONAA >60 04/12/2016 1455   GFRAA >60 04/12/2016 1455      ASSESSMENT AND PLAN 75 y.o. year old female  has a past medical history of Anemia; Breast cancer ([place]); Complication of anesthesia; Diarrhea; GERD (gastroesophageal reflux disease); Headache; Hypertension; Infiltrating ductal carcinoma of right breast ([place]) (01/09/2015); Lung cancer ([place]) (2017); Memory change (10/24/2013); Memory loss; Pulmonary nodule (01/09/2015); and Pure hypercholesterolemia. here with:  1. Memory disturbance  Overall the patient has remained stable. Memory score is 24/30. She will continue on Aricept. Advised that if her symptoms worsen or she develops any new symptoms she should let us know. Follow-up in 6 months with Dr. [name].     Ward Givens, MSN, NP-C 05/11/2016, 1:06 PM [facility] [address] [place], [place] [address] [phone]

## 2016-05-11 NOTE — Patient Instructions (Signed)
Memory score is stable Continue Aricept If your symptoms worsen or you develop new symptoms please let us know.

## 2016-05-12 ENCOUNTER — Encounter (HOSPITAL_COMMUNITY): Payer: Self-pay | Admitting: Hematology & Oncology

## 2016-05-12 ENCOUNTER — Encounter (HOSPITAL_COMMUNITY): Payer: PPO | Attending: Hematology & Oncology | Admitting: Hematology & Oncology

## 2016-05-12 VITALS — BP 118/95 | HR 74 | Temp 98.2°F | Resp 18 | Wt 157.1 lb

## 2016-05-12 DIAGNOSIS — Z9889 Other specified postprocedural states: Secondary | ICD-10-CM | POA: Insufficient documentation

## 2016-05-12 DIAGNOSIS — I10 Essential (primary) hypertension: Secondary | ICD-10-CM | POA: Insufficient documentation

## 2016-05-12 DIAGNOSIS — C342 Malignant neoplasm of middle lobe, bronchus or lung: Secondary | ICD-10-CM | POA: Diagnosis not present

## 2016-05-12 DIAGNOSIS — K219 Gastro-esophageal reflux disease without esophagitis: Secondary | ICD-10-CM | POA: Insufficient documentation

## 2016-05-12 DIAGNOSIS — IMO0001 Reserved for inherently not codable concepts without codable children: Secondary | ICD-10-CM

## 2016-05-12 DIAGNOSIS — C50911 Malignant neoplasm of unspecified site of right female breast: Secondary | ICD-10-CM

## 2016-05-12 DIAGNOSIS — Z90710 Acquired absence of both cervix and uterus: Secondary | ICD-10-CM | POA: Insufficient documentation

## 2016-05-12 DIAGNOSIS — Z853 Personal history of malignant neoplasm of breast: Secondary | ICD-10-CM

## 2016-05-12 DIAGNOSIS — Z923 Personal history of irradiation: Secondary | ICD-10-CM | POA: Insufficient documentation

## 2016-05-12 DIAGNOSIS — Z7982 Long term (current) use of aspirin: Secondary | ICD-10-CM | POA: Insufficient documentation

## 2016-05-12 DIAGNOSIS — E7800 Pure hypercholesterolemia, unspecified: Secondary | ICD-10-CM | POA: Insufficient documentation

## 2016-05-12 DIAGNOSIS — R911 Solitary pulmonary nodule: Secondary | ICD-10-CM

## 2016-05-12 DIAGNOSIS — Z9221 Personal history of antineoplastic chemotherapy: Secondary | ICD-10-CM | POA: Insufficient documentation

## 2016-05-12 DIAGNOSIS — R590 Localized enlarged lymph nodes: Secondary | ICD-10-CM | POA: Insufficient documentation

## 2016-05-12 DIAGNOSIS — Z79899 Other long term (current) drug therapy: Secondary | ICD-10-CM | POA: Insufficient documentation

## 2016-05-12 NOTE — Patient Instructions (Signed)
[place] at [facility] Discharge Instructions  RECOMMENDATIONS MADE BY THE CONSULTANT AND ANY TEST RESULTS WILL BE SENT TO YOUR REFERRING PHYSICIAN.  You saw Dr. Whitney Muse today. Ct Scans will be scheduled in about 3 months. Follow up with MD after CT scans.  Thank you for choosing [place] at [facility] to provide your oncology and hematology care.  To afford each patient quality time with our provider, please arrive at least 15 minutes before your scheduled appointment time.   Beginning January 23rd 2017 lab work for the [facility] will be done in the  Main lab at [facility] on 1st floor. If you have a lab appointment with the [facility] please come in thru the  Main Entrance and check in at the main information desk  You need to re-schedule your appointment should you arrive 10 or more minutes late.  We strive to give you quality time with our providers, and arriving late affects you and other patients whose appointments are after yours.  Also, if you no show three or more times for appointments you may be dismissed from the clinic at the providers discretion.     Again, thank you for choosing [facility].  Our hope is that these requests will decrease the amount of time that you wait before being seen by our physicians.       _____________________________________________________________  Should you have questions after your visit to [facility], please contact our office at [phone] between the hours of 8:30 a.m. and 4:30 p.m.  Voicemails left after 4:30 p.m. will not be returned until the following business day.  For prescription refill requests, have your pharmacy contact our office.         Resources For Cancer Patients and their Caregivers ? American Cancer Society: Can assist with transportation, wigs, general needs, runs Look Good Feel Better.        ([phone] ? Cancer Care: Provides  financial assistance, online support groups, medication/co-pay assistance.  1-800-813-HOPE (937)108-0125) ? [place] Assists [place] cancer patients and their families through emotional , educational and financial support.  650-380-7051 ? [place] DSS Where to apply for food stamps, Medicaid and utility assistance. 417-164-0700 ? RCATS: Transportation to medical appointments. 580-721-6491 ? Social Security Administration: May apply for disability if have a Stage IV cancer. [phone] 9852214334 ? [employer], Disability and Transit Services: Assists with nutrition, care and transit needs. [place] Support Programs: '@10RELATIVEDAYS'$ @ > Cancer Support Group  2nd Tuesday of the month 1pm-2pm, Journey Room  > Creative Journey  3rd Tuesday of the month 1130am-1pm, Journey Room  > Look Good Feel Better  1st Wednesday of the month 10am-12 noon, Journey Room (Call [place] to register [phone])

## 2016-05-12 NOTE — Progress Notes (Signed)
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 ER +90%, PR 30% Her -2 neu indeterminate. Surgery, XRT, hormonal therapy. Intolerant of Tamoxifen, arimidex X 5 years completed 02/15/2005    Primary cancer of right middle lobe of lung ([place])   12/18/2014 Imaging    CT abd/pelvis- New 6 mm right middle lobe pulmonary nodule with a 17 mm nodule identified in the left lung base, increased in size in the interval. Although growth of the left basilar nodule has been slow, neoplasm cannot be excluded.      12/31/2014 PET scan    The 7 mm right middle lobe nodule is hypermetabolic, and there is considerable focal hypermetabolic activity along the posterior inferior margin of the right mainstem bronchus, presumably in the nonenlarged subcarinal lymph node in this vicinity...      03/19/2015 Imaging    MRI brain- No evidence of metastatic disease to the brain or meninges.      06/30/2015 Imaging    CT chest- Significant interval enlargement of right middle lobe 15 mm nodule (previously 7 mm) and 17 mm subcarinal lymph node. Stable lingular and anterior left lower lobe nodules.      07/15/2015 Procedure    CT guided biopsy of right middle lobe nodule, by IR      07/15/2015 Pathology Results    Lung, needle/core biopsy(ies), right middle lobe - POSITIVE FOR POORLY DIFFERENTIATED CARCINOMA      07/27/2015 Procedure    Fiberoptic video bronchoscopy, endobronchial ultrasound- guided transbronchial biopsy of level 7 mediastinal lymph node - subcarinal lymph node by Dr. Tharon Aquas Tright.      07/27/2015 Pathology Results    WANG NEEDLE, FINE NEEDLE ASPIRATION, 7 NODE #2, B (SPECIMEN 2 OF 2, COLLECTED ON 07/27/15): NO MALIGNANT CELLS IDENTIFIED. FINE NEEDLE ASPIRATION, ENDOSCOPIC SPECIMEN A, EBUS 7 NODE (SPECIMEN 1 OF 2, COLLECTED ON 07/27/2015):NO MALIGNANT CELLS IDENTIFIED.      09/08/2015 -  09/15/2015 Radiation Therapy    Right middle lobe/ 54 Gy at 18 GY per fraction x 3 fractions by Dr. Pablo Ledger      10/27/2015 Imaging    CT chest- response to therapy of the right middle lobe lung lesion. Only presumed radiation induced scarring remains. Progression of mediastinal adenopathy with probable developing right infrahilar nodal metastasis. Smaller pulmonary nodules are stable      11/08/2015 PET scan    Prominent and hypermetabolic subcarinal and right infrahilar adenopathy compatible with malignancy, both the size and metabolic activity of these lymph nodes is significantly increased from April 2016.      11/08/2015 Relapse/Recurrence    PET shows progressive disease      11/22/2015 - 12/27/2015 Chemotherapy    Carboplatin/Paclitaxel weekly      11/23/2015 Imaging    MRI brain- No acute or metastatic intracranial abnormality.  Stable MRI appearance of the brain since 2016.      11/24/2015 - 12/31/2015 Radiation Therapy    Dr. Pablo Ledger, 60 gy in 30 fractions at 2 Gy per fraction      02/11/2016 Imaging    Continued evolution of postradiation changes in RLL w/o findings to suggest local recurrence of disease or definite metastatic disease in thorax, resolution of previously noted mediastinal LAD      05/09/2016 Imaging    CT C/A/P with no new adenopathy or new mass to suggest recurrence. Increased volume  loss and density in the RML and RLL related to prior radiation and associated atelectasis, this obscures the site of the prior RML nodule. Stable 6 mm lingular nodule, no change from earliest available comparison 12/31/2014        CURRENT THERAPY:  Observation  INTERVAL HISTORY: Kari Bullock 75 y.o. female returns today for additional f/u of a locally advanced NSCLC.  Patient followed by [facility] for Stage II infiltrating ductal carcinoma the right breast, grade 2 (T1 A., N1 a) with 2 of 4 positive lymph nodes, ER +90%, PR +30%, lymph node metastases were 1 mm or slightly  less. HER-2/neu was indeterminant at 2+. She was diagnosed in February 2001 treated with surgery followed by radiation therapy and then hormonal therapy. She could not tolerate tamoxifen so was switched to Arimidex which she took for total 5 years ending on 02/15/2005, no evidence of disease.  She underwent XRT with Dr. 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 Repeat imaging showed local recurrence she has completed concurrent therapy and is here today for ongoing follow-up.   Kari Bullock is accompanied by her husband and daughter. I personally reviewed and went over imaging studies with the patient.  She complains of occasional itching at her port site. She denies any pain associated with her port. She is scheduled for a port flush on 06/07/16.  She reports her nose runs all the time. She has realized it is allergies.   She denies any headaches or blurry vision. She denies any trouble swallowing.  Past Medical History:  Diagnosis Date  . Anemia    in the past  . Breast cancer ([place])    2001 RT breat lumpectomy rad tx  . Complication of anesthesia    confusion  . Diarrhea   . GERD (gastroesophageal reflux disease)   . Headache    years ago, none since hysterectomy  . Hypertension   . Infiltrating ductal carcinoma of right breast ([place]) 01/09/2015  . Lung cancer ([place]) 2017  . Memory change 10/24/2013  . Memory loss   . Pulmonary nodule 01/09/2015  . Pure hypercholesterolemia     has Memory change; Periumbilical pain; GERD (gastroesophageal reflux disease); Infiltrating ductal carcinoma of right breast ([place]); Diverticulosis of colon without hemorrhage; Chronic diarrhea; Mucosal abnormality of stomach; and Primary cancer of right middle lobe of lung ([place]) on her problem list.     is allergic to codeine; morphine and related; adhesive [tape]; sulfur; and tamoxifen.  Kari Bullock had no medications  administered during this visit.  Past Surgical History:  Procedure Laterality Date  . ABDOMINAL HYSTERECTOMY    . BILATERAL OOPHORECTOMY  2007  . BREAST SURGERY Right    right lumpectomy, lymph glands remove  . CARPAL TUNNEL RELEASE Right   . CATARACT EXTRACTION Bilateral    lens implant  . COLONOSCOPY  2011   RMR: 1. Anal papilla, otherwise normal rectum. 2. Left-sided diverticula, single diminutive polyp in the sigmoid segment status post cold biospy removal. Remainder of the colonic mucosa appeared normal.   . COLONOSCOPY N/A 01/14/2015   Procedure: COLONOSCOPY;  Surgeon: Daneil Dolin, MD;  Location: [facility];  Service: Endoscopy;  Laterality: N/A;  930 - moved to 10:15  . ESOPHAGOGASTRODUODENOSCOPY N/A 01/14/2015   RMR: Colonic diverticulosis. status post segmental biospy and stool sampling.  No egd done today  . ESOPHAGOGASTRODUODENOSCOPY N/A 02/11/2015   RMR: Focally abnormal  antrum of doubtful clinical significance- Status post biopsy.   . INCISION AND DRAINAGE OF WOUND  2011   buttocks  . TONSILLECTOMY    . VIDEO BRONCHOSCOPY WITH ENDOBRONCHIAL ULTRASOUND N/A 01/19/2015   Procedure: VIDEO BRONCHOSCOPY WITH ENDOBRONCHIAL ULTRASOUND;  Surgeon: Ivin Poot, MD;  Location: [place];  Service: Thoracic;  Laterality: N/A;  . VIDEO BRONCHOSCOPY WITH ENDOBRONCHIAL ULTRASOUND N/A 07/27/2015   Procedure: VIDEO BRONCHOSCOPY WITH ENDOBRONCHIAL ULTRASOUND;  Surgeon: Ivin Poot, MD;  Location: [facility];  Service: Thoracic;  Laterality: N/A;    Denies any headaches, dizziness, double vision, fevers, chills, night sweats, nausea, vomiting, diarrhea, constipation, chest pain, heart palpitations, shortness of breath, blood in stool, black tarry stool, urinary pain, urinary burning, urinary frequency, hematuria, appetite loss.   Reports itching. Occasional itching at her port site. 14 point review of systems was performed and is negative except as detailed under history of present illness  and above   PHYSICAL EXAMINATION  ECOG PERFORMANCE STATUS: 1 - Symptomatic but completely ambulatory  Vitals with BMI 05/11/2016  Height 5' 3"   Weight 158 lbs 10 oz  BMI 95.0  Systolic 932  Diastolic 79  Pulse 90  Respirations    GENERAL:alert, no distress, well nourished, well developed, comfortable, cooperative, smiling and accompanied by her husband and daughter. Able to get on examination table with assistance. SKIN: skin color, texture, turgor are normal, no rashes or significant lesions. HEAD: Normocephalic, No masses, lesions, tenderness or abnormalities EYES: normal, PERRLA, EOMI, Conjunctiva are pink and non-injected EARS: External ears normal OROPHARYNX:lips, buccal mucosa, and tongue normal and mucous membranes are moist  NECK: supple, no adenopathy, thyroid normal size, non-tender, without nodularity, trachea midline LYMPH:  no palpable lymphadenopathy BREAST:not examined LUNGS: clear to auscultation  HEART: regular rate & rhythm, no murmurs, no gallops, S1 normal and S2 normal ABDOMEN:abdomen soft, non-tender, obese and normal bowel sounds BACK: Back symmetric, no curvature., No CVA tenderness EXTREMITIES:less then 2 second capillary refill, no joint deformities, effusion, or inflammation, no skin discoloration, no clubbing, no cyanosis  NEURO: alert & oriented x 3 with fluent speech, no focal motor/sensory deficits, gait normal  LABORATORY DATA: I have reviewed the data as listed. CBC    Component Value Date/Time   WBC 6.0 04/12/2016 1455   RBC 4.43 04/12/2016 1455   HGB 12.5 04/12/2016 1455   HCT 38.5 04/12/2016 1455   PLT 170 04/12/2016 1455   MCV 86.9 04/12/2016 1455   MCH 28.2 04/12/2016 1455   MCHC 32.5 04/12/2016 1455   RDW 14.0 04/12/2016 1455   LYMPHSABS 1.0 04/12/2016 1455   MONOABS 0.6 04/12/2016 1455   EOSABS 0.2 04/12/2016 1455   BASOSABS 0.0 04/12/2016 1455      Chemistry      Component Value Date/Time   NA 139 04/12/2016 1455   K 3.8  04/12/2016 1455   CL 108 04/12/2016 1455   CO2 27 04/12/2016 1455   BUN 13 04/12/2016 1455   CREATININE 0.72 04/12/2016 1455   CREATININE 0.69 12/16/2014 1058      Component Value Date/Time   CALCIUM 9.7 04/12/2016 1455   ALKPHOS 65 04/12/2016 1455   AST 21 04/12/2016 1455   ALT 17 04/12/2016 1455   BILITOT 0.4 04/12/2016 1455     RADIOGRAPHY: I have personally reviewed the radiological images as listed and agreed with the findings in the report.  Study Result   CLINICAL DATA:  Restaging non-small cell lung cancer, right middle lobe, pathologic stage IIIa, prior radiation  therapy. Infiltrating ductal carcinoma of the right breast, stage IIA.  EXAM: CT CHEST, ABDOMEN, AND PELVIS WITH CONTRAST  TECHNIQUE: Multidetector CT imaging of the chest, abdomen and pelvis was performed following the standard protocol during bolus administration of intravenous contrast.  CONTRAST:  138m ISOVUE-300 IOPAMIDOL (ISOVUE-300) INJECTION 61%  COMPARISON:  Multiple exams, including 11/08/2015 and chest CT of 10/27/2015  FINDINGS: CT CHEST FINDINGS  Cardiovascular: Coronary, aortic arch, and branch vessel atherosclerotic vascular disease. Left Port-A-Cath, catheter tip in the lower SVC.  Mediastinum/Nodes: No recurrence of the subcarinal or right infrahilar adenopathy ; no new adenopathy.  Lungs/Pleura: New small right pleural effusion. Nonspecific for transudative or exudative.  Centrilobular emphysema. Peripheral scarring in the right upper lobe.  There is increased volume loss/ atelectasis in the right middle lobe obscuring the regional nodule. There is also some increased right lower lobe atelectasis both in the infrahilar position and anteriorly in the right lower lobe likely partially corresponding with radiation port region. There may be some underlying radiation pneumonitis/ fibrosis.  Stable 6 mm lingular nodule, image 89/6, no change from  12/21/2014.  Musculoskeletal: Unremarkable  CT ABDOMEN PELVIS FINDINGS  Hepatobiliary: Unremarkable  Pancreas: Unremarkable  Spleen: Unremarkable  Adrenals/Urinary Tract: Bladder floor laxity with cystocele. Kidneys and adrenal glands unremarkable  Stomach/Bowel: Sigmoid colon diverticulosis.  Vascular/Lymphatic: Aortoiliac atherosclerotic vascular disease.  Reproductive: Uterus absent.  Ovaries not well seen.  Other: No supplemental non-categorized findings.  Musculoskeletal: Mild dextroconvex lumbar scoliosis with rotary component. In conjunction with spondylosis there is right foraminal impingement at L4-5 and L5-S 1, and left foraminal impingement at L3-4 and L5-S1.  IMPRESSION: 1. No new adenopathy or new mass to suggest recurrence. 2. There is some increased with volume loss and density in the right middle lobe and right lower lobe probably related to prior radiation therapy and associated atelectasis. This obscures the site of the prior right middle lobe nodule. 3. Stable 6 mm lingular nodule, no change from earliest available comparison of 12/31/2014. May warrant observation. 4. New small right pleural effusion. 5. Centrilobular emphysema. 6. Pelvic floor laxity with cystocele. 7. Other imaging findings of potential clinical significance: Sigmoid colon diverticulosis. Aortoiliac atherosclerotic vascular disease. Lumbar scoliosis, spondylosis, and degenerative disc disease causing impingement at L3-4, L4-5, and L5-S1. Coronary, aortic arch, and branch vessel atherosclerotic vascular disease.   Electronically Signed   By: WVan ClinesM.D.   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 These show a positive response to treatment. She is doing quite well. Appetite has returned, energy is back to baseline.  I have ordered repeat imaging in 3 months to monitor the 6 mm lingular nodule. If these return stable, then we will extend imaging to Q6 months.  She complains of occasional itching at her port site. She is scheduled for port flush on 06/07/16. She will contact our clinic if this itching persists. No abnormality was appreciated on  physical exam.   Last mammogram performed on 03/07/2016.  RTC 3 months to discuss CT results.   NCCN guidelines for Non-Small Cell Lung Cancer Surveillance in the setting of clinical/radiographic remission are as follows (4.2017):  A. Stage I-II (primary treatment included surgery +/- chemotherapy):   1. H+P and chest CT +/- contrast every 6 months for 2-3 years, then H+P and low-dose non-contrast-enhanced chest CT annually  B. Stage I-II (primary treatment included RT) or Stage III or Stage IV (oligometastatic with all sites treated with definitive intent)   1. H+P and chest CT +/- contrast every 3-6 months for 3 years, then H+P and chest CT +/- contrast every 6 months for 2 years, then H+P and low-dose non-contrast-enhanced chest CT annually    A. Residual or new radiographic abnormalities may require more frequent imaging  C. Smoking cessation advice, counseling, and pharmacotherapy  D. PET/CT or Brain MRI is not routinely indicated.  NCCN guidelines recommends the following surveillance for invasive breast cancer:  A. History and Physical exam every 4-6 months for 5  years and then every 12 months.  B. Mammography every 12 months  C. Women on Tamoxifen: annual gynecologic assessment every 12 months if uterus is present.  D. Women on aromatase inhibitor or who experience ovarian failure secondary to treatment should have monitoring of bone health with a bone mineral density determination at baseline and periodically thereafter.  E. Assess and encourage adherence to adjuvant endocrine therapy.  F. Evidence suggests that active lifestyle and achieving and maintaining an ideal body weight (20-25 BMI) may lead to optimal breast cancer outcomes.  All questions were answered. The patient knows to call the clinic with any problems, questions or concerns. We can certainly see the patient much sooner if necessary.  This document serves as a record of services personally performed by Ancil Linsey, MD. It was created on her behalf by Arlyce Harman, a trained medical scribe. The creation of this record is based on the scribe's personal observations and the provider's statements to them. This document has been checked and approved by the attending provider.  I have reviewed the above documentation for accuracy and completeness, and I agree with the above. Molli Hazard, MD

## 2016-05-16 ENCOUNTER — Encounter (HOSPITAL_COMMUNITY): Payer: Self-pay | Admitting: Hematology & Oncology

## 2016-05-29 ENCOUNTER — Telehealth: Payer: Self-pay | Admitting: Neurology

## 2016-05-29 DIAGNOSIS — I10 Essential (primary) hypertension: Secondary | ICD-10-CM | POA: Diagnosis not present

## 2016-05-29 DIAGNOSIS — R32 Unspecified urinary incontinence: Secondary | ICD-10-CM | POA: Diagnosis not present

## 2016-05-29 DIAGNOSIS — K219 Gastro-esophageal reflux disease without esophagitis: Secondary | ICD-10-CM | POA: Diagnosis not present

## 2016-05-29 DIAGNOSIS — N390 Urinary tract infection, site not specified: Secondary | ICD-10-CM | POA: Diagnosis not present
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
## 2016-05-29 NOTE — Telephone Encounter (Signed)
I called and talked with the daughter. The patient has had a change in mental status over the last 4 days. She has become confused, hallucinating. The patient has a mild dementia, the first thing to do is to get a good medical evaluation, exclude urinary tract infection or other source of confusion. No new medications have been noted. If nothing is seen on the evaluation, we may need to cut back on Aricept, add an antipsychotic medication.  If the agitation is severe, hospitalization may be required.

## 2016-06-01 DIAGNOSIS — H26492 Other secondary cataract, left eye: Secondary | ICD-10-CM | POA: Diagnosis not present

## 2016-06-01 DIAGNOSIS — H40013 Open angle with borderline findings, low risk, bilateral: Secondary | ICD-10-CM | POA: Diagnosis not present

## 2016-06-01 DIAGNOSIS — Z961 Presence of intraocular lens: Secondary | ICD-10-CM | POA: Diagnosis not present

## 2016-06-05 MED ORDER — ALPRAZOLAM 0.5 MG PO TABS: 0.5000 mg | ORAL_TABLET | Freq: Every evening | ORAL | 0 refills | Status: AC | PRN

## 2016-06-05 NOTE — Telephone Encounter (Signed)
RX for xanax fa
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
## 2016-06-05 NOTE — Telephone Encounter (Signed)
RX for xanax faxed to [facility] [place]. Received a receipt of confirmation.

## 2016-06-05 NOTE — Addendum Note (Signed)
Addended by: Larey Seat on: 06/05/2016 04:59 PM   Modules accepted: Orders
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
## 2016-06-07 ENCOUNTER — Encounter (HOSPITAL_COMMUNITY): Payer: PPO

## 2016-06-07 ENCOUNTER — Other Ambulatory Visit (HOSPITAL_COMMUNITY): Payer: Self-pay | Admitting: Emergency Medicine

## 2016-06-07 MED ORDER — ARIPIPRAZOLE 5 MG PO TABS: 5.0000 mg | ORAL_TABLET | Freq: Every day | ORAL | 1 refills | Status: DC

## 2016-06-07 NOTE — Telephone Encounter (Signed)
I called the daughter. The patient apparently went for a medical evaluation, was found to have a bladder infection, she was placed on Cipro which seemed to worsen the agitation as well. She is off Cipro now, but agitation remains a problem. She has a history of breast cancer.  I will place her on low-dose Abilify, we will get her into the office for a revisit. The Xanax seemed to help the agitation initially.  We may need to cut the Aricept dose in half.

## 2016-06-07 NOTE — Addendum Note (Signed)
Addended by: Margette Fast on: 06/07/2016 03:40 PM   Modules accepted: Orders

## 2016-06-07 NOTE — Telephone Encounter (Signed)
Dr [name]- FYI  Called and spoke to daughter Stanton Kidney. Scheduled f/u per Dr [name] request tomorrow at 330pm, check in 315pm. She verbalized understanding.
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
## 2016-06-08 ENCOUNTER — Encounter: Payer: Self-pay | Admitting: Neurology

## 2016-06-08 ENCOUNTER — Telehealth (HOSPITAL_COMMUNITY): Payer: Self-pay

## 2016-06-08 ENCOUNTER — Ambulatory Visit (INDEPENDENT_AMBULATORY_CARE_PROVIDER_SITE_OTHER): Payer: PPO | Admitting: Neurology

## 2016-06-08 VITALS — Ht 63.0 in | Wt 154.0 lb

## 2016-06-08 DIAGNOSIS — N390 Urinary tract infection, site not specified: Secondary | ICD-10-CM | POA: Diagnosis not present

## 2016-06-08 DIAGNOSIS — R413 Other amnesia: Secondary | ICD-10-CM | POA: Diagnosis not present

## 2016-06-08 DIAGNOSIS — F05 Delirium due to known physiological condition: Secondary | ICD-10-CM | POA: Diagnosis not present

## 2016-06-08 DIAGNOSIS — R410 Disorientation, unspecified: Secondary | ICD-10-CM

## 2016-06-08 NOTE — Telephone Encounter (Signed)
Talked with patients daughter Evonnie Pat, They are at neurologist doc appointment now. Daughter worried that her mother has brain mets. Per Dr. Thompsons [facility] neuro does not order brain scan then she will. Will call daughter back this afternoon or tomorrow morning.

## 2016-06-08 NOTE — Progress Notes (Signed)
Reason for visit: Delirium  Kari Bullock is an 75 y.o. female  History of present illness:  Kari Bullock is a 75 year old right-handed white female with a history of a mild memory disorder. The patient also has a history of breast cancer, she has undergone chemotherapy for this. She began having confusion, hallucinations and agitation about 2 weeks ago, she went to her primary care physician and was found to have a bladder infection. The patient was placed on Cipro, but she unfortunately got worse with her mental status, with increased agitation. She has gone off of the Cipro 3 days ago, she was confused until yesterday. The patient was placed on Abilify taking 5 mg at night, she took her first dose last evening, she slept well last night and she has had a relatively good day today with decreased confusion and agitation. The patient is not having hallucinations at this point. The patient has not had any focal numbness or weakness of the face, arms, or legs. She has had a decrease in appetite, she has lost about 4 pounds since late August 2017. The patient is not having any cough, fevers or chills, gait disturbance. She comes to this office for an urgent revisit.  Past Medical History:  Diagnosis Date  . Anemia    in the past  . Breast cancer ([place])    2001 RT breat lumpectomy rad tx  . Complication of anesthesia    confusion  . Diarrhea   . GERD (gastroesophageal reflux disease)   . Headache    years ago, none since hysterectomy  . Hypertension   . 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 Lung cancer ([place]) 2017  . Memory change 10/24/2013  . Memory loss   . Pulmonary nodule 01/09/2015  . Pure hypercholesterolemia     Past Surgical History:  Procedure Laterality Date  . ABDOMINAL HYSTERECTOMY    . BILATERAL OOPHORECTOMY  2007  . BREAST SURGERY Right    right lumpectomy, lymph glands remove  . CARPAL TUNNEL RELEASE Right   . CATARACT EXTRACTION Bilateral     lens implant  . COLONOSCOPY  2011   RMR: 1. Anal papilla, otherwise normal rectum. 2. Left-sided diverticula, single diminutive polyp in the sigmoid segment status post cold biospy removal. Remainder of the colonic mucosa appeared normal.   . COLONOSCOPY N/A 01/14/2015   Procedure: COLONOSCOPY;  Surgeon: Daneil Dolin, MD;  [facility];  Service: Endoscopy;  Laterality: N/A;  930 - moved to 10:15  . ESOPHAGOGASTRODUODENOSCOPY N/A 01/14/2015   RMR: Colonic diverticulosis. status post segmental biospy and stool sampling.  No egd done today  . ESOPHAGOGASTRODUODENOSCOPY N/A 02/11/2015   RMR: Focally abnormal antrum of doubtful clinical significance- Status post biopsy.   . INCISION AND DRAINAGE OF WOUND  2011   buttocks  . TONSILLECTOMY    . VIDEO BRONCHOSCOPY WITH ENDOBRONCHIAL ULTRASOUND N/A 01/19/2015   Procedure: VIDEO BRONCHOSCOPY WITH ENDOBRONCHIAL ULTRASOUND;  Surgeon: Ivin Poot, MD;  Location: [facility];  Service: Thoracic;  Laterality: N/A;  . VIDEO BRONCHOSCOPY WITH ENDOBRONCHIAL ULTRASOUND N/A 07/27/2015   Procedure: VIDEO BRONCHOSCOPY WITH ENDOBRONCHIAL ULTRASOUND;  Surgeon: Ivin Poot, MD;  Location: [facility];  Service: Thoracic;  Laterality: N/A;    Family History  Problem Relation Age of Onset  . Stroke Mother   . Breast cancer Maternal Aunt   . Breast cancer Maternal Grandmother   . Heart attack Father   . Pancreatic cancer Brother  Social history:  reports that she quit smoking about 16 years ago. Her smoking use included Cigarettes. She has a 20.00 pack-year smoking history. She has never used smokeless tobacco. She reports that she does not drink alcohol or use drugs.    Allergies  Allergen Reactions  . Codeine Nausea And Vomiting  . Morphine And Related Itching  . Ciprofloxacin Other (See Comments)    Agitation/anxiety  . Adhesive [Tape] Rash    Please use paper tape  . Sulfur Rash    Vomitting  . Tamoxifen Other (See Comments)    Blurred  vision and dizziness    Medications:  Prior to Admission medications   Medication Sig Start Date End Date Taking? Authorizing Provider  ALPRAZolam Duanne Moron) 0.5 MG tablet Take 1 tablet (0.5 mg total) by mouth at bedtime as needed for anxiety. 06/05/16  Yes Carmen Dohmeier, MD  ARIPiprazole (ABILIFY) 5 MG tablet Take 1 tablet (5 mg total) by mouth daily. 06/07/16  Yes Kathrynn Ducking, MD  aspirin EC 81 MG tablet Take 81 mg by mouth daily.   Yes Historical Provider, MD  cholecalciferol (VITAMIN D) 1000 UNITS tablet Take 1,000 Units by mouth at bedtime. D3   Yes Historical Provider, MD  donepezil (ARICEPT) 10 MG tablet Take 1 tablet (10 mg total) by mouth at bedtime. 05/11/16  Yes Ward Givens, NP  lidocaine-prilocaine (EMLA) cream Apply a quarter size amount to port site 1 hour prior to chemo. Do not rub in. Cover with plastic wrap. 11/15/15  Yes Patrici Ranks, MD  losartan-hydrochlorothiazide (HYZAAR) 50-12.5 MG per tablet Take 1 tablet by mouth daily.  09/30/11  Yes Historical Provider, MD  naproxen sodium (ANAPROX) 220 MG tablet Take 220 mg by mouth 2 (two) times daily as needed (pain). ALEVE   Yes Historical Provider, MD  omeprazole (PRILOSEC) 20 MG capsule TAKE 1 CAPSULE(20 MG) BY MOUTH TWICE DAILY BEFORE A MEAL 02/22/16  Yes Carlis Stable, NP    ROS:  Out of a complete 14 system review of symptoms, the patient complains only of the following symptoms, and all other reviewed systems are negative.  Confusion Memory disturbance  Height '5\' 3"'$  (1.6 m), weight 154 lb (69.9 kg).  Physical Exam  General: The patient is alert and cooperative at the time of the examination.  Skin: No significant peripheral edema is noted.   Neurologic Exam  Mental status: The patient is alert and oriented x 3 at the time of the examination. The Mini-Mental Status Examination done today shows a total score 20/30.   Cranial nerves: Facial symmetry is present. Speech is normal, no aphasia or dysarthria is  noted. Extraocular movements are full. Visual fields are full.  Motor: The patient has good strength in all 4 extremities.  Sensory examination: Soft touch sensation is symmetric on the face, arms, and legs.  Coordination: The patient has good finger-nose-finger and heel-to-shin bilaterally.  Gait and station: The patient has a normal gait. Tandem gait is slightly unsteady. Romberg is negative. No drift is seen.  Reflexes: Deep tendon reflexes are symmetric.   Assessment/Plan:  1. Memory disturbance  2. Confusion, delirium state  The patient had a recent urinary tract infection that seemed to initiate confusion, but the confusion was potentiated by the use of Cipro. The patient has been taken off of this medication, we will recheck a urinalysis today. The patient is to remain on Abilify for another week, if she does well with her mental status at that point the medication  is to be withdrawn. She will continue the Aricept for now. We will check MRI of the brain with and without gadolinium given the history of breast cancer. She will follow-up for her next scheduled appointment.  Jill Alexanders MD 06/08/2016 3:50 PM  [facility] [address] [place], [place] [address]  Phone [phone] Fax [phone]

## 2016-06-08 NOTE — Progress Notes (Signed)
bullistic

## 2016-06-09 ENCOUNTER — Telehealth: Payer: Self-pay | Admitting: Neurology

## 2016-06-09 LAB — MICROSCOPIC EXAMINATION: CASTS: NONE SEEN /LPF

## 2016-06-09 LAB — URINALYSIS, ROUTINE W REFLEX MICROSCOPIC
BILIRUBIN UA: NEGATIVE
Glucose, UA: NEGATIVE
Ketones, UA: NEGATIVE
Nitrite, UA: NEGATIVE
PH UA: 6.5 (ref 5.0–7.5)
PROTEIN UA: NEGATIVE
Specific Gravity, UA: 1.005 (ref 1.005–1.030)
UUROB: 0.2 mg/dL (ref 0.2–1.0)

## 2016-06-09 MED ORDER — ERYTHROMYCIN BASE 250 MG PO TABS: 250.0000 mg | ORAL_TABLET | Freq: Four times a day (QID) | ORAL | 0 refills | Status: DC

## 2016-06-09 NOTE — Telephone Encounter (Signed)
Called the caretaker, urinalysis shows elevated white cells in the urine, will treat with erythromycin, the patient cannot take Cipro or sulfa drugs. A prescription was called in.

## 2016-06-12 ENCOUNTER — Encounter (HOSPITAL_COMMUNITY): Payer: Self-pay

## 2016-06-12 ENCOUNTER — Encounter (HOSPITAL_COMMUNITY): Payer: PPO | Attending: Hematology & Oncology

## 2016-06-12 ENCOUNTER — Encounter (HOSPITAL_COMMUNITY): Payer: PPO

## 2016-06-12 VITALS — BP 138/78 | HR 91 | Temp 98.0°F | Resp 18

## 2016-06-12 DIAGNOSIS — Z452 Encounter for adjustment and management of vascular access device: Secondary | ICD-10-CM | POA: Diagnosis not present

## 2016-06-12 DIAGNOSIS — Z7982 Long term (current) use of aspirin: Secondary | ICD-10-CM | POA: Diagnosis not present

## 2016-06-12 DIAGNOSIS — I10 Essential (primary) hypertension: Secondary | ICD-10-CM | POA: Diagnosis not present

## 2016-06-12 DIAGNOSIS — C342 Malignant neoplasm of middle lobe, bronchus or lung: Secondary | ICD-10-CM

## 2016-06-12 DIAGNOSIS — Z9221 Personal history of antineoplastic chemotherapy: Secondary | ICD-10-CM | POA: Diagnosis not present

## 2016-06-12 DIAGNOSIS — Z853 Personal history of malignant neoplasm of breast: Secondary | ICD-10-CM | POA: Insufficient documentation

## 2016-06-12 DIAGNOSIS — Z90710 Acquired absence of both cervix and uterus: Secondary | ICD-10-CM | POA: Insufficient documentation

## 2016-06-12 DIAGNOSIS — R590 Localized enlarged lymph nodes: Secondary | ICD-10-CM | POA: Diagnosis not present

## 2016-06-12 DIAGNOSIS — Z79899 Other long term (current) drug therapy: Secondary | ICD-10-CM | POA: Diagnosis not present

## 2016-06-12 DIAGNOSIS — Z923 Personal history of irradiation: Secondary | ICD-10-CM | POA: Diagnosis not present

## 2016-06-12 DIAGNOSIS — Z9889 Other specified postprocedural states: Secondary | ICD-10-CM | POA: Diagnosis not present

## 2016-06-12 DIAGNOSIS — E7800 Pure hypercholesterolemia, unspecified: Secondary | ICD-10-CM | POA: Insufficient documentation

## 2016-06-12 DIAGNOSIS — K219 Gastro-esophageal reflux disease without esophagitis: Secondary | ICD-10-CM | POA: Diagnosis not present

## 2016-06-12 DIAGNOSIS — C50911 Malignant neoplasm of unspecified site of right female breast: Secondary | ICD-10-CM

## 2016-06-12 DIAGNOSIS — Z95828 Presence of other vascular implants and grafts: Secondary | ICD-10-CM

## 2016-06-12 LAB — CBC WITH DIFFERENTIAL/PLATELET
BASOS PCT: 1 %
Basophils Absolute: 0 10*3/uL (ref 0.0–0.1)
EOS ABS: 0.2 10*3/uL (ref 0.0–0.7)
Eosinophils Relative: 3 %
HEMATOCRIT: 41.6 % (ref 36.0–46.0)
HEMOGLOBIN: 13.3 g/dL (ref 12.0–15.0)
Lymphocytes Relative: 13 %
Lymphs Abs: 0.7 10*3/uL (ref 0.7–4.0)
MCH: 27.4 pg (ref 26.0–34.0)
MCHC: 32 g/dL (ref 30.0–36.0)
MCV: 85.6 fL (ref 78.0–100.0)
MONO ABS: 0.6 10*3/uL (ref 0.1–1.0)
MONOS PCT: 10 %
NEUTROS ABS: 4.2 10*3/uL (ref 1.7–7.7)
Neutrophils Relative %: 73 %
Platelets: 188 10*3/uL (ref 150–400)
RBC: 4.86 MIL/uL (ref 3.87–5.11)
RDW: 14.9 % (ref 11.5–15.5)
WBC: 5.7 10*3/uL (ref 4.0–10.5)

## 2016-06-12 LAB — COMPREHENSIVE METABOLIC PANEL
ALBUMIN: 4 g/dL (ref 3.5–5.0)
ALT: 18 U/L (ref 14–54)
ANION GAP: 7 (ref 5–15)
AST: 24 U/L (ref 15–41)
Alkaline Phosphatase: 61 U/L (ref 38–126)
BUN: 13 mg/dL (ref 6–20)
CHLORIDE: 103 mmol/L (ref 101–111)
CO2: 27 mmol/L (ref 22–32)
Calcium: 10.2 mg/dL (ref 8.9–10.3)
Creatinine, Ser: 0.74 mg/dL (ref 0.44–1.00)
GFR calc Af Amer: >60 mL/min (ref >60)
GFR calc non Af Amer: >60 mL/min (ref >60)
GLUCOSE: 128 mg/dL — AB (ref 65–99)
POTASSIUM: 3.3 mmol/L — AB (ref 3.5–5.1)
SODIUM: 137 mmol/L (ref 135–145)
Total Bilirubin: 0.5 mg/dL (ref 0.3–1.2)
Total Protein: 6.8 g/dL (ref 6.5–8.1)

## 2016-06-12 MED ORDER — SODIUM CHLORIDE 0.9% FLUSH
10.0000 mL | INTRAVENOUS | Status: DC | PRN
  Administered 2016-06-12: 10 mL via INTRAVENOUS
  Filled 2016-06-12: qty 10

## 2016-06-12 MED ORDER — HEPARIN SOD (PORK) LOCK FLUSH 100 UNIT/ML IV SOLN
500.0000 [IU] | Freq: Once | INTRAVENOUS | Status: AC
  Administered 2016-06-12: 500 [IU] via INTRAVENOUS

## 2016-06-12 MED ORDER — HEPARIN SOD (PORK) LOCK FLUSH 100 UNIT/ML IV SOLN
INTRAVENOUS | Status: AC
  Filled 2016-06-12: qty 5

## 2016-06-12 NOTE — Patient Instructions (Signed)
[place] at [facility] Discharge Instructions  RECOMMENDATIONS MADE BY THE CONSULTANT AND ANY TEST RESULTS WILL BE SENT TO YOUR REFERRING PHYSICIAN.  Port flush with labs today. Follow up as scheduled.  Thank you for choosing [facility] at [facility] to provide your oncology and hematology care.  To afford each patient quality time with our provider, please arrive at least 15 minutes before your scheduled appointment time.   Beginning January 23rd 2017 lab work for the [facility] will be done in the  Main lab at [facility] on 1st floor. If you have a lab appointment with the [facility] please come in thru the  Main Entrance and check in at the main information desk  You need to re-schedule your appointment should you arrive 10 or more minutes late.  We strive to give you quality time with our providers, and arriving late affects you and other patients whose appointments are after yours.  Also, if you no show three or more times for appointments you may be dismissed from the clinic at the providers discretion.     Again, thank you for choosing [facility].  Our hope is that these requests will decrease the amount of time that you wait before being seen by our physicians.       _____________________________________________________________  Should you have questions after your visit to [facility], please contact our office at [phone] between the hours of 8:30 a.m. and 4:30 p.m.  Voicemails left after 4:30 p.m. will not be returned until the following business day.  For prescription refill requests, have your pharmacy contact our office.         Resources For Cancer Patients and their Caregivers ? American Cancer Society: Can assist with transportation, wigs, general needs, runs Look Good Feel Better.        ([phone] 432-5221 ? Cancer Care: Provides financial assistance, online support groups,  medication/co-pay assistance.  1-800-813-HOPE 343-569-8826) ? [place] Assists [place] cancer patients and their families through emotional , educational and financial support.  (681)537-3071 ? [place] DSS Where to apply for food stamps, Medicaid and utility assistance. (872)258-1549 ? [facility]: Transportation to medical appointments. 7572112055 ? Social Security Administration: May apply for disability if have a Stage IV cancer. (432)816-4161 (530)804-9579 ? [employer], Disability and Transit Services: Assists with nutrition, care and transit needs. [place] Support Programs: '@10RELATIVEDAYS'$ @ > Cancer Support Group  2nd Tuesday of the month 1pm-2pm, Journey Room  > Creative Journey  3rd Tuesday of the month 1130am-1pm, Journey Room  > Look Good Feel Better  1st Wednesday of the month 10am-12 noon, Journey Room (Call [place] to register [phone])

## 2016-06-12 NOTE — Progress Notes (Signed)
Kari Bullock presented for Portacath access and flush. Portacath located left chest wall accessed with  H 20 needle. Good blood return present. Portacath flushed with 43m NS and 500U/520mHeparin and needle removed intact. Procedure without incident. Patient tolerated procedure well.  Labs drawn per orders

## 2016-06-26 ENCOUNTER — Telehealth: Payer: Self-pay | Admitting: Neurology

## 2016-06-26 NOTE — Telephone Encounter (Signed)
Patient's daughter is calling to schedule an MRI for the patient. °

## 2016-06-28 NOTE — Telephone Encounter (Signed)
Daughter Stanton Kidney called to schedule MRI, states it has now been 14 business days since Dr. [name] talked about scheduling this MRI. Please call.

## 2016-06-28 NOTE — Telephone Encounter (Signed)
Called pt, spoke with her daughter. Let her know the MRI order has been sent to GI, gave her the phone #

## 2016-06-28 NOTE — Telephone Encounter (Signed)
Patient was sent to Kari Bullock, would you mind calling to let her know?

## 2016-07-11 NOTE — Telephone Encounter (Signed)
Patient has MR Brain w/wo contrast scheduled for 07/12/16 at [facility].

## 2016-07-12 ENCOUNTER — Ambulatory Visit
Admission: RE | Admit: 2016-07-12 | Discharge: 2016-07-12 | Disposition: A | Discharge status: Home / Self Care | Payer: PPO | Source: Ambulatory Visit | Attending: Neurology | Admitting: Neurology

## 2016-07-12 DIAGNOSIS — F05 Delirium due to known physiological condition: Principal | ICD-10-CM

## 2016-07-12 DIAGNOSIS — R410 Disorientation, unspecified: Secondary | ICD-10-CM

## 2016-07-12 MED ORDER — GADOBENATE DIMEGLUMINE 529 MG/ML IV SOLN
15.0000 mL | Freq: Once | INTRAVENOUS | Status: AC | PRN
  Administered 2016-07-12: 14 mL via INTRAVENOUS

## 2016-07-13 ENCOUNTER — Telehealth: Payer: Self-pay | Admitting: Neurology

## 2016-07-13 NOTE — Telephone Encounter (Signed)
I called the patient, talk with the husband. No real change in MRI the brain, the patient has underlying memory problem, she will have good days and bad days with confusion. No evidence of stroke or cancer involvement of the brain.   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  - Mild scattered periventricular and subcortical foci of non-specific gliosis. - No acute findings. No abnormal enhancing lesions. - No significant change from MRI on 04/03/15 and 11/23/15.

## 2016-08-07 ENCOUNTER — Other Ambulatory Visit: Payer: Self-pay | Admitting: Neurology

## 2016-08-14 ENCOUNTER — Ambulatory Visit (HOSPITAL_COMMUNITY): Payer: PPO

## 2016-08-16 ENCOUNTER — Encounter (HOSPITAL_COMMUNITY): Payer: PPO | Attending: Hematology & Oncology | Admitting: Hematology & Oncology

## 2016-08-16 ENCOUNTER — Encounter (HOSPITAL_COMMUNITY): Payer: Self-pay | Admitting: Hematology & Oncology

## 2016-08-16 ENCOUNTER — Other Ambulatory Visit: Payer: Self-pay | Admitting: Nurse Practitioner

## 2016-08-16 VITALS — BP 126/95 | HR 78 | Temp 97.6°F | Resp 20 | Wt 156.8 lb

## 2016-08-16 DIAGNOSIS — C342 Malignant neoplasm of middle lobe, bronchus or lung: Secondary | ICD-10-CM | POA: Diagnosis not present

## 2016-08-16 DIAGNOSIS — R911 Solitary pulmonary nodule: Secondary | ICD-10-CM

## 2016-08-16 DIAGNOSIS — K219 Gastro-esophageal reflux disease without esophagitis: Secondary | ICD-10-CM | POA: Insufficient documentation

## 2016-08-16 DIAGNOSIS — C50911 Malignant neoplasm of unspecified site of right female breast: Secondary | ICD-10-CM

## 2016-08-16 DIAGNOSIS — Z853 Personal history of malignant neoplasm of breast: Secondary | ICD-10-CM

## 2016-08-16 DIAGNOSIS — Z9889 Other specified postprocedural states: Secondary | ICD-10-CM | POA: Insufficient documentation

## 2016-08-16 DIAGNOSIS — Z79899 Other long term (current) drug therapy: Secondary | ICD-10-CM | POA: Diagnosis not present

## 2016-08-16 DIAGNOSIS — I10 Essential (primary) hypertension: Secondary | ICD-10-CM | POA: Diagnosis not present

## 2016-08-16 DIAGNOSIS — Z9221 Personal history of antineoplastic chemotherapy: Secondary | ICD-10-CM | POA: Insufficient documentation

## 2016-08-16 DIAGNOSIS — R590 Localized enlarged lymph nodes: Secondary | ICD-10-CM | POA: Diagnosis not present

## 2016-08-16 DIAGNOSIS — E7800 Pure hypercholesterolemia, unspecified: Secondary | ICD-10-CM | POA: Diagnosis not present

## 2016-08-16 DIAGNOSIS — IMO0001 Reserved for inherently not codable concepts without codable children: Secondary | ICD-10-CM

## 2016-08-16 DIAGNOSIS — Z452 Encounter for adjustment and management of vascular access device: Secondary | ICD-10-CM | POA: Diagnosis not present

## 2016-08-16 DIAGNOSIS — Z923 Personal history of irradiation: Secondary | ICD-10-CM | POA: Diagnosis not present

## 2016-08-16 DIAGNOSIS — Z95828 Presence of other vascular implants and grafts: Secondary | ICD-10-CM

## 2016-08-16 DIAGNOSIS — R410 Disorientation, unspecified: Secondary | ICD-10-CM

## 2016-08-16 DIAGNOSIS — Z7982 Long term (current) use of aspirin: Secondary | ICD-10-CM | POA: Insufficient documentation

## 2016-08-16 DIAGNOSIS — Z90710 Acquired absence of both cervix and uterus: Secondary | ICD-10-CM | POA: Insufficient documentation

## 2016-08-16 LAB — CBC WITH DIFFERENTIAL/PLATELET
BASOS ABS: 0 10*3/uL (ref 0.0–0.1)
BASOS PCT: 1 %
EOS ABS: 0.1 10*3/uL (ref 0.0–0.7)
Eosinophils Relative: 2 %
HEMATOCRIT: 40.9 % (ref 36.0–46.0)
HEMOGLOBIN: 13.1 g/dL (ref 12.0–15.0)
Lymphocytes Relative: 14 %
Lymphs Abs: 0.9 10*3/uL (ref 0.7–4.0)
MCH: 28 pg (ref 26.0–34.0)
MCHC: 32 g/dL (ref 30.0–36.0)
MCV: 87.4 fL (ref 78.0–100.0)
Monocytes Absolute: 0.6 10*3/uL (ref 0.1–1.0)
Monocytes Relative: 10 %
NEUTROS ABS: 4.4 10*3/uL (ref 1.7–7.7)
NEUTROS PCT: 73 %
Platelets: 174 10*3/uL (ref 150–400)
RBC: 4.68 MIL/uL (ref 3.87–5.11)
RDW: 14 % (ref 11.5–15.5)
WBC: 6 10*3/uL (ref 4.0–10.5)

## 2016-08-16 LAB — COMPREHENSIVE METABOLIC PANEL
ALBUMIN: 3.9 g/dL (ref 3.5–5.0)
ALK PHOS: 64 U/L (ref 38–126)
ALT: 16 U/L (ref 14–54)
AST: 17 U/L (ref 15–41)
Anion gap: 7 (ref 5–15)
BILIRUBIN TOTAL: 0.8 mg/dL (ref 0.3–1.2)
BUN: 12 mg/dL (ref 6–20)
CALCIUM: 10.1 mg/dL (ref 8.9–10.3)
CO2: 28 mmol/L (ref 22–32)
Chloride: 101 mmol/L (ref 101–111)
Creatinine, Ser: 0.56 mg/dL (ref 0.44–1.00)
GFR calc Af Amer: >60 mL/min (ref >60)
GFR calc non Af Amer: >60 mL/min (ref >60)
GLUCOSE: 93 mg/dL (ref 65–99)
Potassium: 3.4 mmol/L — ABNORMAL LOW (ref 3.5–5.1)
SODIUM: 136 mmol/L (ref 135–145)
TOTAL PROTEIN: 6.8 g/dL (ref 6.5–8.1)

## 2016-08-16 MED ORDER — HEPARIN SOD (PORK) LOCK FLUSH 100 UNIT/ML IV SOLN
INTRAVENOUS | Status: AC
  Filled 2016-08-16: qty 5

## 2016-08-16 MED ORDER — HEPARIN SOD (PORK) LOCK FLUSH 100 UNIT/ML IV SOLN
500.0000 [IU] | Freq: Once | INTRAVENOUS | Status: AC
  Administered 2016-08-16: 500 [IU] via INTRAVENOUS

## 2016-08-16 MED ORDER — SODIUM CHLORIDE 0.9% FLUSH
10.0000 mL | INTRAVENOUS | Status: DC | PRN
  Administered 2016-08-16: 10 mL via INTRAVENOUS
  Filled 2016-08-16: qty 10

## 2016-08-16 NOTE — Progress Notes (Signed)
Kari Bullock presented for Portacath access and flush. Portacath located left  chest wall accessed with  H 20 needle. Good blood return present. Portacath flushed with 24m NS and 500U/550mHeparin and needle removed intact. Procedure without incident. Patient tolerated procedure well.

## 2016-08-16 NOTE — Progress Notes (Signed)
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 ER +90%, PR 30% Her -2 neu indeterminate. Surgery, XRT, hormonal therapy. Intolerant of Tamoxifen, arimidex X 5 years completed 02/15/2005    Primary cancer of right middle lobe of lung ([place])   12/18/2014 Imaging    CT abd/pelvis- New 6 mm right middle lobe pulmonary nodule with a 17 mm nodule identified in the left lung base, increased in size in the interval. Although growth of the left basilar nodule has been slow, neoplasm cannot be excluded.      12/31/2014 PET scan    The 7 mm right middle lobe nodule is hypermetabolic, and there is considerable focal hypermetabolic activity along the posterior inferior margin of the right mainstem bronchus, presumably in the nonenlarged subcarinal lymph node in this vicinity...      03/19/2015 Imaging    MRI brain- No evidence of metastatic disease to the brain or meninges.      06/30/2015 Imaging    CT chest- Significant interval enlargement of right middle lobe 15 mm nodule (previously 7 mm) and 17 mm subcarinal lymph node. Stable lingular and anterior left lower lobe nodules.      07/15/2015 Procedure    CT guided biopsy of right middle lobe nodule, by IR      07/15/2015 Pathology Results    Lung, needle/core biopsy(ies), right middle lobe - POSITIVE FOR POORLY DIFFERENTIATED CARCINOMA      07/27/2015 Procedure    Fiberoptic video bronchoscopy, endobronchial ultrasound- guided transbronchial biopsy of level 7 mediastinal lymph node - subcarinal lymph node by Dr. Tharon Aquas Tright.      07/27/2015 Pathology Results    WANG NEEDLE, FINE NEEDLE ASPIRATION, 7 NODE #2, B (SPECIMEN 2 OF 2, COLLECTED ON 07/27/15): NO MALIGNANT CELLS IDENTIFIED. FINE NEEDLE ASPIRATION, ENDOSCOPIC SPECIMEN A, EBUS 7 NODE (SPECIMEN 1 OF 2, COLLECTED ON 07/27/2015):NO MALIGNANT CELLS IDENTIFIED.      09/08/2015 - 09/15/2015 Radiation Therapy    Right middle lobe/ 54 Gy at 18 GY per fraction x 3 fractions by Dr. Pablo Ledger      10/27/2015 Imaging    CT chest- response to therapy of the right middle lobe lung lesion. Only presumed radiation induced scarring remains. Progression of mediastinal adenopathy with probable developing right infrahilar nodal metastasis. Smaller pulmonary nodules are stable      11/08/2015 PET scan    Prominent and hypermetabolic subcarinal and right infrahilar adenopathy compatible with malignancy, both the size and metabolic activity of these lymph nodes is significantly increased from April 2016.      11/08/2015 Relapse/Recurrence    PET shows progressive disease      11/22/2015 - 12/27/2015 Chemotherapy    Carboplatin/Paclitaxel weekly      11/23/2015 Imaging    MRI brain- No acute or metastatic intracranial abnormality.  Stable MRI appearance of the brain since 2016.      11/24/2015 - 12/31/2015 Radiation Therapy    Dr. Pablo Ledger, 60 gy in 30 fractions at 2 Gy per fraction      02/11/2016 Imaging    Continued evolution of postradiation changes in RLL w/o findings to suggest local recurrence of disease or definite metastatic disease in thorax, resolution of previously noted mediastinal LAD      05/09/2016 Imaging    CT C/A/P with no new adenopathy or new mass to suggest recurrence. Increased volume  loss and density in the RML and RLL related to prior radiation and associated atelectasis, this obscures the site of the prior RML nodule. Stable 6 mm lingular nodule, no change from earliest available comparison 12/31/2014      07/12/2016 Imaging    Mildly abnormal MRI brain (with and without) demonstrating: - Mild diffuse and moderate perisylvian / mesial temporal atrophy.  - Mild scattered periventricular and subcortical foci of non-specific gliosis. - No acute findings. No abnormal enhancing lesions. - No significant change from MRI on 04/03/15 and 11/23/15.         CURRENT THERAPY:  Observation  INTERVAL HISTORY: Kari Bullock 75 y.o. female returns today for additional f/u of a locally advanced NSCLC.  Patient followed by [facility] for Stage II infiltrating ductal carcinoma the right breast, grade 2 (T1 A., N1 a) with 2 of 4 positive lymph nodes, ER +90%, PR +30%, lymph node metastases were 1 mm or slightly less. HER-2/neu was indeterminant at 2+. She was diagnosed in February 2001 treated with surgery followed by radiation therapy and then hormonal therapy. She could not tolerate tamoxifen so was switched to Arimidex which she took for total 5 years ending on 02/15/2005, no evidence of disease.  She underwent XRT with Dr. 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 Repeat imaging showed local recurrence she has completed concurrent therapy and is here today for ongoing follow-up.   Kari Bullock is accompanied by her husband and her daughter. She is due for a port flush today and labs.   She is seeing Dr. [name] for neurology because of memory loss and confusion. She had a brain MRI on 07/13/16. She says that it has been better since she got this MRI a few weeks ago. Family notes that her confusion and "combativeness" began after a UTI, although improved, persist. She is now on aricept.   She had a UTI a few weeks ago. She says it's better and believes it's gone.   She has received her flu shot.   She denies insomnia.   Her husband says that sometimes she doesn't eat much. She denies cough or CP. She is overdue for repeat staging CT's of her lung cancer, but family currently wants to hold off.   Past Medical History:  Diagnosis Date  . Anemia    in the past  . Breast cancer ([place])    2001 RT breat lumpectomy rad tx  . Complication of anesthesia    confusion  . Diarrhea   . GERD (gastroesophageal reflux disease)   . Headache    years ago, none since  hysterectomy  . Hypertension   . Infiltrating ductal carcinoma of right breast ([place]) 01/09/2015  . Lung cancer ([place]) 2017  . Memory change 10/24/2013  . Memory loss   . Pulmonary nodule 01/09/2015  . Pure hypercholesterolemia     has Memory change; Periumbilical pain; GERD (gastroesophageal reflux disease); Infiltrating ductal carcinoma of right breast ([place]); Diverticulosis of colon without hemorrhage; Chronic diarrhea; Mucosal abnormality of stomach; and Primary cancer of right middle lobe of lung ([place]) on her problem list.     is allergic to codeine; morphine and related; ciprofloxacin; adhesive [tape]; sulfur; and tamoxifen.  We administered heparin lock flush and sodium chloride flush.  Past Surgical History:  Procedure Laterality Date  . ABDOMINAL HYSTERECTOMY    . BILATERAL OOPHORECTOMY  2007  . BREAST SURGERY Right  right lumpectomy, lymph glands remove  . CARPAL TUNNEL RELEASE Right   . CATARACT EXTRACTION Bilateral    lens implant  . COLONOSCOPY  2011   RMR: 1. Anal papilla, otherwise normal rectum. 2. Left-sided diverticula, single diminutive polyp in the sigmoid segment status post cold biospy removal. Remainder of the colonic mucosa appeared normal.   . COLONOSCOPY N/A 01/14/2015   Procedure: COLONOSCOPY;  Surgeon: Daneil Dolin, MD;  [facility];  Service: Endoscopy;  Laterality: N/A;  930 - moved to 10:15  . ESOPHAGOGASTRODUODENOSCOPY N/A 01/14/2015   RMR: Colonic diverticulosis. status post segmental biospy and stool sampling.  No egd done today  . ESOPHAGOGASTRODUODENOSCOPY N/A 02/11/2015   RMR: Focally abnormal antrum of doubtful clinical significance- Status post biopsy.   . INCISION AND DRAINAGE OF WOUND  2011   buttocks  . TONSILLECTOMY    . VIDEO BRONCHOSCOPY WITH ENDOBRONCHIAL ULTRASOUND N/A 01/19/2015   Procedure: VIDEO BRONCHOSCOPY WITH ENDOBRONCHIAL ULTRASOUND;  Surgeon: Ivin Poot, MD;  Location: [place];  Service: Thoracic;  Laterality: N/A;    . VIDEO BRONCHOSCOPY WITH ENDOBRONCHIAL ULTRASOUND N/A 07/27/2015   Procedure: VIDEO BRONCHOSCOPY WITH ENDOBRONCHIAL ULTRASOUND;  Surgeon: Ivin Poot, MD;  Location: [facility];  Service: Thoracic;  Laterality: N/A;    Review of Systems  Constitutional: Negative.   HENT: Negative.   Eyes: Negative.   Respiratory: Negative.   Cardiovascular: Negative.   Gastrointestinal: Negative.   Genitourinary: Negative.   Musculoskeletal: Negative.   Skin: Negative.   Neurological: Negative.        Intermittent memory loss and confusion.   Endo/Heme/Allergies: Negative.   Psychiatric/Behavioral: Positive for memory loss. The patient does not have insomnia.   All other systems reviewed and are negative.  14 point review of systems was performed and is negative except as detailed under history of present illness and above   PHYSICAL EXAMINATION  ECOG PERFORMANCE STATUS: 1 - Symptomatic but completely ambulatory  Vitals with BMI 08/16/2016  Height   Weight 156 lbs 13 oz  BMI   Systolic 353  Diastolic 95  Pulse 78  Respirations 20    Physical Exam  Constitutional: She is oriented to person, place, and time and well-developed, well-nourished, and in no distress.  Pt was able to get on exam table with assistance.   HENT:  Head: Normocephalic and atraumatic.  Mouth/Throat: No oropharyngeal exudate.  Eyes: EOM are normal. Pupils are equal, round, and reactive to light. Right eye exhibits no discharge. Left eye exhibits no discharge. No scleral icterus.  Neck: Normal range of motion. Neck supple.  Cardiovascular: Normal rate, regular rhythm and normal heart sounds.   Pulmonary/Chest: Effort normal and breath sounds normal. No respiratory distress.  Abdominal: Soft. Bowel sounds are normal. She exhibits no distension. There is no tenderness. There is no rebound and no guarding.  Musculoskeletal: Normal range of motion.  Lymphadenopathy:    She has no cervical adenopathy.  Neurological: She  is alert and oriented to person, place, and time. No cranial nerve deficit. Gait normal.  Skin: Skin is warm and dry.  Psychiatric: Mood, affect and judgment normal.  Nursing note and vitals reviewed.   LABORATORY DATA: I have reviewed the data as listed. CBC    Component Value Date/Time   WBC 6.0 08/16/2016 1051   RBC 4.68 08/16/2016 1051   HGB 13.1 08/16/2016 1051   HCT 40.9 08/16/2016 1051   PLT 174 08/16/2016 1051   MCV 87.4 08/16/2016 1051   MCH  28.0 08/16/2016 1051   MCHC 32.0 08/16/2016 1051   RDW 14.0 08/16/2016 1051   LYMPHSABS 0.9 08/16/2016 1051   MONOABS 0.6 08/16/2016 1051   EOSABS 0.1 08/16/2016 1051   BASOSABS 0.0 08/16/2016 1051      Chemistry      Component Value Date/Time   NA 136 08/16/2016 1051   K 3.4 (L) 08/16/2016 1051   CL 101 08/16/2016 1051   CO2 28 08/16/2016 1051   BUN 12 08/16/2016 1051   CREATININE 0.56 08/16/2016 1051   CREATININE 0.69 12/16/2014 1058      Component Value Date/Time   CALCIUM 10.1 08/16/2016 1051   ALKPHOS 64 08/16/2016 1051   AST 17 08/16/2016 1051   ALT 16 08/16/2016 1051   BILITOT 0.8 08/16/2016 1051     RADIOGRAPHY: I have personally reviewed the radiological images as listed and agreed with the findings in the report.  Study Result   [facility]  NEUROIMAGING REPORT    STUDY DATE: 07/12/16 PATIENT NAME: Kari Bullock DOB: Aug 02, 1941 MRN: [id number]  ORDERING CLINICIAN: Kathrynn Ducking, MD CLINICAL HISTORY: 75 year old female with confusion. History of breast cancer.  EXAM: MRI brain (with and without) TECHNIQUE: MRI of the brain with and without contrast was obtained utilizing 5 mm axial slices with T1, T2, T2 flair, SWI and diffusion weighted views.  T1 sagittal, T2 coronal and postcontrast views in the axial and coronal plane were obtained. CONTRAST: 74m multihance [facility] [address](1.5 Tesla MRI)   FINDINGS:   No abnormal  lesions are seen on diffusion-weighted views to suggest acute ischemia. The cortical sulci, fissures and cisterns are notable for mild diffuse and moderate perisylvian / mesial temporal atrophy. Lateral, third and fourth ventricle are normal in size and appearance. No extra-axial fluid collections are seen. No evidence of mass effect or midline shift.  Mild scattered periventricular and subcortical foci of non-specific gliosis.  No abnormal lesions are seen on post contrast views.  On sagittal views the posterior fossa, pituitary gland and corpus callosum are unremarkable. No evidence of intracranial hemorrhage on SWI views. The orbits and their contents, paranasal sinuses and calvarium are unremarkable. The odontoid process has mild degenerative changes with associated mild pannus formation posteriorly, but without mass effect upon the cervical spinal cord. Intracranial flow voids are present.    IMPRESSION:  Mildly abnormal MRI brain (with and without) demonstrating: - Mild diffuse and moderate perisylvian / mesial temporal atrophy.  - Mild scattered periventricular and subcortical foci of non-specific gliosis. - No acute findings. No abnormal enhancing lesions. - No significant change from MRI on 04/03/15 and 11/23/15.      INTERPRETING PHYSICIAN:  VPenni Bombard MD Certified in Neurology, Neurophysiology and Neuroimaging  [facility] [address] [place]  [phone]      ASSESSMENT AND PLAN:  History of Stage II ER+ Her-2 neu indeterminate breast cancer diagnosed in 2001 Abnormal CT of the Chest Abnormal PET imaging of the chest EBUS with inconclusive results Lung, core biopsy RML on 07/15/2015 biopsy positive for poorly differentiated carcinoma, staining pattern favors poorly differentiated squamous cell carcinoma Wang needle FNA 7 Node #2, no malignant cells 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 She is currently on aricept. Family notes some improvement. They would like to hold off on repeat imaging until after the New Year. I have ordered repeat scans for February.   Last mammogram performed on 03/07/2016.  Port flush today. Labs were reviewed, results are noted above.   RTC 3 months to discuss CT results.   NCCN guidelines for Non-Small Cell Lung Cancer Surveillance in the setting of clinical/radiographic remission are as follows (4.2017):  A. Stage I-II (primary treatment included surgery +/- chemotherapy):   1. H+P and chest CT +/- contrast every 6 months for 2-3 years, then H+P and low-dose non-contrast-enhanced chest CT annually  B. Stage I-II (primary treatment included RT) or Stage III or Stage IV (oligometastatic with all sites treated with definitive intent)   1. H+P and chest CT +/- contrast every 3-6 months for 3 years, then H+P and chest CT +/- contrast every 6 months for 2 years, then H+P and low-dose non-contrast-enhanced chest CT annually    A. Residual or new radiographic abnormalities may require more frequent imaging  C. Smoking cessation advice, counseling, and pharmacotherapy  D. PET/CT or Brain MRI is not routinely indicated.  NCCN guidelines recommends the following surveillance for invasive breast cancer:  A. History and Physical exam every 4-6 months for 5 years and then every 12 months.  B. Mammography every 12 months  C. Women on Tamoxifen: annual gynecologic assessment every 12 months if uterus is present.  D. Women on aromatase inhibitor or who experience ovarian failure secondary to treatment should have monitoring of bone health with a bone mineral density determination at baseline and periodically  thereafter.  E. Assess and encourage adherence to adjuvant endocrine therapy.  F. Evidence suggests that active lifestyle and achieving and maintaining an ideal body weight (20-25 BMI) may lead to optimal breast cancer outcomes.  All questions were answered. The patient knows to call the clinic with any problems, questions or concerns. We can certainly see the patient much sooner if necessary.  This document serves as a record of services personally performed by Ancil Linsey, MD. It was created on her behalf by Martinique Casey, a trained medical scribe. The creation of this record is based on the scribe's personal observations and the provider's statements to them. This document has been checked and approved by the attending provider.  I have reviewed the above documentation for accuracy and completeness, and I agree with the above.  Molli Hazard, MD

## 2016-08-16 NOTE — Patient Instructions (Addendum)
[place] at [facility] Discharge Instructions  RECOMMENDATIONS MADE BY THE CONSULTANT AND ANY TEST RESULTS WILL BE SENT TO YOUR REFERRING PHYSICIAN.  RTC end FEB -- CT scans prior  Port flush today with labs  Port flush in 8 weeks  Thank you for choosing [place] at [facility] to provide your oncology and hematology care.  To afford each patient quality time with our provider, please arrive at least 15 minutes before your scheduled appointment time.   Beginning January 23rd 2017 lab work for the [facility] will be done in the  Main lab at [facility] on 1st floor. If you have a lab appointment with the [place] please come in thru the  Main Entrance and check in at the main information desk  You need to re-schedule your appointment should you arrive 10 or more minutes late.  We strive to give you quality time with our providers, and arriving late affects you and other patients whose appointments are after yours.  Also, if you no show three or more times for appointments you may be dismissed from the clinic at the providers discretion.     Again, thank you for choosing [facility].  Our hope is that these requests will decrease the amount of time that you wait before being seen by our physicians.       _____________________________________________________________  Should you have questions after your visit to [facility], please contact our office at [phone] between the hours of 8:30 a.m. and 4:30 p.m.  Voicemails left after 4:30 p.m. will not be returned until the following business day.  For prescription refill requests, have your pharmacy contact our office.         Resources For Cancer Patients and their Caregivers ? American Cancer Society: Can assist with transportation, wigs, general needs, runs Look Good Feel Better.        (661)288-6202 ? Cancer Care: Provides financial assistance,  online support groups, medication/co-pay assistance.  1-800-813-HOPE 9077820849) ? [place] Assists [place] cancer patients and their families through emotional , educational and financial support.  (779)659-5873 ? [place] DSS Where to apply for food stamps, Medicaid and utility assistance. 508-703-4066 ? RCATS: Transportation to medical appointments. (919)243-3291 ? Social Security Administration: May apply for disability if have a Stage IV cancer. 816-294-7398 6290321510 ? [employer], Disability and Transit Services: Assists with nutrition, care and transit needs. [place] Support Programs: '@10RELATIVEDAYS'$ @ > Cancer Support Group  2nd Tuesday of the month 1pm-2pm, Journey Room  > Creative Journey  3rd Tuesday of the month 1130am-1pm, Journey Room  > Look Good Feel Better  1st Wednesday of the month 10am-12 noon, Journey Room (Call [place] to register [phone])

## 2016-08-17 ENCOUNTER — Encounter (HOSPITAL_COMMUNITY): Payer: Self-pay | Admitting: Hematology & Oncology

## 2016-08-30 DIAGNOSIS — K219 Gastro-esophageal reflux disease without esophagitis: Secondary | ICD-10-CM | POA: Diagnosis not present

## 2016-08-30 DIAGNOSIS — I10 Essential (primary) hypertension: Secondary | ICD-10-CM | POA: Diagnosis not present

## 2016-08-30 DIAGNOSIS — L853 Xerosis cutis: Secondary | ICD-10-CM | POA: Diagnosis not present

## 2016-08-31 ENCOUNTER — Other Ambulatory Visit (HOSPITAL_COMMUNITY): Payer: Self-pay | Admitting: Internal Medicine

## 2016-08-31 DIAGNOSIS — Z780 Asymptomatic menopausal state: Secondary | ICD-10-CM

## 2016-09-07 ENCOUNTER — Ambulatory Visit (HOSPITAL_COMMUNITY)
Admission: RE | Admit: 2016-09-07 | Discharge: 2016-09-07 | Disposition: A | Discharge status: Home / Self Care | Payer: PPO | Source: Ambulatory Visit | Attending: Internal Medicine | Admitting: Internal Medicine

## 2016-09-07 DIAGNOSIS — M810 Age-related osteoporosis without current pathological fracture: Secondary | ICD-10-CM | POA: Insufficient documentation

## 2016-09-07 DIAGNOSIS — Z780 Asymptomatic menopausal state: Secondary | ICD-10-CM | POA: Diagnosis not present

## 2016-09-07 DIAGNOSIS — M85831 Other specified disorders of bone density and structure, right forearm: Secondary | ICD-10-CM | POA: Insufficient documentation

## 2016-10-12 ENCOUNTER — Encounter (HOSPITAL_COMMUNITY): Payer: PPO | Attending: Hematology & Oncology

## 2016-10-12 ENCOUNTER — Encounter (HOSPITAL_COMMUNITY): Payer: Self-pay

## 2016-10-12 VITALS — BP 148/80 | HR 88 | Temp 97.9°F | Resp 18

## 2016-10-12 DIAGNOSIS — Z9221 Personal history of antineoplastic chemotherapy: Secondary | ICD-10-CM | POA: Diagnosis not present

## 2016-10-12 DIAGNOSIS — C342 Malignant neoplasm of middle lobe, bronchus or lung: Secondary | ICD-10-CM

## 2016-10-12 DIAGNOSIS — I10 Essential (primary) hypertension: Secondary | ICD-10-CM | POA: Diagnosis not present

## 2016-10-12 DIAGNOSIS — Z853 Personal history of malignant neoplasm of breast: Secondary | ICD-10-CM | POA: Insufficient documentation

## 2016-10-12 DIAGNOSIS — Z9889 Other specified postprocedural states: Secondary | ICD-10-CM | POA: Insufficient documentation

## 2016-10-12 DIAGNOSIS — Z452 Encounter for adjustment and management of vascular access device: Secondary | ICD-10-CM | POA: Diagnosis not present

## 2016-10-12 DIAGNOSIS — R590 Localized enlarged lymph nodes: Secondary | ICD-10-CM | POA: Diagnosis not present

## 2016-10-12 DIAGNOSIS — Z95828 Presence of other vascular implants and grafts: Secondary | ICD-10-CM

## 2016-10-12 DIAGNOSIS — Z7982 Long term (current) use of aspirin: Secondary | ICD-10-CM | POA: Insufficient documentation

## 2016-10-12 DIAGNOSIS — K219 Gastro-esophageal reflux disease without esophagitis: Secondary | ICD-10-CM | POA: Insufficient documentation

## 2016-10-12 DIAGNOSIS — Z90710 Acquired absence of both cervix and uterus: Secondary | ICD-10-CM | POA: Insufficient documentation

## 2016-10-12 DIAGNOSIS — Z79899 Other long term (current) drug therapy: Secondary | ICD-10-CM | POA: Diagnosis not present

## 2016-10-12 DIAGNOSIS — E7800 Pure hypercholesterolemia, unspecified: Secondary | ICD-10-CM | POA: Insufficient documentation

## 2016-10-12 DIAGNOSIS — Z923 Personal history of irradiation: Secondary | ICD-10-CM | POA: Diagnosis not present

## 2016-10-12 LAB — CBC WITH DIFFERENTIAL/PLATELET
BASOS ABS: 0 10*3/uL (ref 0.0–0.1)
BASOS PCT: 1 %
EOS PCT: 3 %
Eosinophils Absolute: 0.2 10*3/uL (ref 0.0–0.7)
HCT: 39.9 % (ref 36.0–46.0)
Hemoglobin: 13.3 g/dL (ref 12.0–15.0)
LYMPHS PCT: 16 %
Lymphs Abs: 0.9 10*3/uL (ref 0.7–4.0)
MCH: 28.6 pg (ref 26.0–34.0)
MCHC: 33.3 g/dL (ref 30.0–36.0)
MCV: 85.8 fL (ref 78.0–100.0)
MONO ABS: 0.5 10*3/uL (ref 0.1–1.0)
Monocytes Relative: 9 %
Neutro Abs: 4.1 10*3/uL (ref 1.7–7.7)
Neutrophils Relative %: 71 %
PLATELETS: 164 10*3/uL (ref 150–400)
RBC: 4.65 MIL/uL (ref 3.87–5.11)
RDW: 13.5 % (ref 11.5–15.5)
WBC: 5.7 10*3/uL (ref 4.0–10.5)

## 2016-10-12 LAB — COMPREHENSIVE METABOLIC PANEL
ALT: 18 U/L (ref 14–54)
AST: 20 U/L (ref 15–41)
Albumin: 3.8 g/dL (ref 3.5–5.0)
Alkaline Phosphatase: 71 U/L (ref 38–126)
Anion gap: 8 (ref 5–15)
BUN: 11 mg/dL (ref 6–20)
CO2: 27 mmol/L (ref 22–32)
CREATININE: 0.55 mg/dL (ref 0.44–1.00)
Calcium: 9.9 mg/dL (ref 8.9–10.3)
Chloride: 98 mmol/L — ABNORMAL LOW (ref 101–111)
GFR calc Af Amer: >60 mL/min (ref >60)
GLUCOSE: 97 mg/dL (ref 65–99)
POTASSIUM: 3.6 mmol/L (ref 3.5–5.1)
Sodium: 133 mmol/L — ABNORMAL LOW (ref 135–145)
Total Bilirubin: 0.6 mg/dL (ref 0.3–1.2)
Total Protein: 6.6 g/dL (ref 6.5–8.1)

## 2016-10-12 MED ORDER — SODIUM CHLORIDE 0.9% FLUSH
10.0000 mL | INTRAVENOUS | Status: DC | PRN
  Administered 2016-10-12: 10 mL via INTRAVENOUS
  Filled 2016-10-12: qty 10

## 2016-10-12 MED ORDER — HEPARIN SOD (PORK) LOCK FLUSH 100 UNIT/ML IV SOLN
500.0000 [IU] | Freq: Once | INTRAVENOUS | Status: AC
  Administered 2016-10-12: 500 [IU] via INTRAVENOUS

## 2016-10-12 NOTE — Progress Notes (Signed)
Kari Bullock presented for Portacath access and flush. Portacath located  chest wall accessed with  H 20 needle. Good blood return present. Portacath flushed with 3m NS and 500U/57mHeparin and needle removed intact. Procedure without incident. Patient tolerated procedure well. Vitals stable and discharged from clinic ambulatory.  Follow up as scheduled.

## 2016-10-12 NOTE — Patient Instructions (Signed)
[place] at [facility] Discharge Instructions  RECOMMENDATIONS MADE BY THE CONSULTANT AND ANY TEST RESULTS WILL BE SENT TO YOUR REFERRING PHYSICIAN.  Port flush with labs Follow up as scheduled.  Thank you for choosing [place] at [facility] to provide your oncology and hematology care.  To afford each patient quality time with our provider, please arrive at least 15 minutes before your scheduled appointment time.    If you have a lab appointment with the [facility] please come in thru the  Main Entrance and check in at the main information desk  You need to re-schedule your appointment should you arrive 10 or more minutes late.  We strive to give you quality time with our providers, and arriving late affects you and other patients whose appointments are after yours.  Also, if you no show three or more times for appointments you may be dismissed from the clinic at the providers discretion.     Again, thank you for choosing [facility].  Our hope is that these requests will decrease the amount of time that you wait before being seen by our physicians.       _____________________________________________________________  Should you have questions after your visit to [facility], please contact our office at [phone] between the hours of 8:30 a.m. and 4:30 p.m.  Voicemails left after 4:30 p.m. will not be returned until the following business day.  For prescription refill requests, have your pharmacy contact our office.       Resources For Cancer Patients and their Caregivers ? American Cancer Society: Can assist with transportation, wigs, general needs, runs Look Good Feel Better.        ([phone] 035-2947 ? Cancer Care: Provides financial assistance, online support groups, medication/co-pay assistance.  1-800-813-HOPE 3640208673) ? [place] Assists [place] cancer patients and  their families through emotional , educational and financial support.  505-750-6461 ? [place] DSS Where to apply for food stamps, Medicaid and utility assistance. 4247918376 ? RCATS: Transportation to medical appointments. 872-874-8534 ? Social Security Administration: May apply for disability if have a Stage IV cancer. (305)706-8435 (215)260-3045 ? [employer], Disability and Transit Services: Assists with nutrition, care and transit needs. [place] Support Programs: '@10RELATIVEDAYS'$ @ > Cancer Support Group  2nd Tuesday of the month 1pm-2pm, Journey Room  > Creative Journey  3rd Tuesday of the month 1130am-1pm, Journey Room  > Look Good Feel Better  1st Wednesday of the month 10am-12 noon, Journey Room (Call [place] to register [phone])

## 2016-11-06 ENCOUNTER — Encounter (HOSPITAL_COMMUNITY): Payer: PPO | Attending: Hematology & Oncology

## 2016-11-06 DIAGNOSIS — K219 Gastro-esophageal reflux disease without esophagitis: Secondary | ICD-10-CM | POA: Insufficient documentation

## 2016-11-06 DIAGNOSIS — Z923 Personal history of irradiation: Secondary | ICD-10-CM | POA: Insufficient documentation

## 2016-11-06 DIAGNOSIS — I10 Essential (primary) hypertension: Secondary | ICD-10-CM | POA: Insufficient documentation

## 2016-11-06 DIAGNOSIS — Z7982 Long term (current) use of aspirin: Secondary | ICD-10-CM | POA: Insufficient documentation

## 2016-11-06 DIAGNOSIS — R590 Localized enlarged lymph nodes: Secondary | ICD-10-CM | POA: Insufficient documentation

## 2016-11-06 DIAGNOSIS — Z90710 Acquired absence of both cervix and uterus: Secondary | ICD-10-CM | POA: Insufficient documentation

## 2016-11-06 DIAGNOSIS — Z79899 Other long term (current) drug therapy: Secondary | ICD-10-CM | POA: Insufficient documentation

## 2016-11-06 DIAGNOSIS — C342 Malignant neoplasm of middle lobe, bronchus or lung: Secondary | ICD-10-CM | POA: Insufficient documentation

## 2016-11-06 DIAGNOSIS — E7800 Pure hypercholesterolemia, unspecified: Secondary | ICD-10-CM | POA: Insufficient documentation

## 2016-11-06 DIAGNOSIS — Z9889 Other specified postprocedural states: Secondary | ICD-10-CM | POA: Insufficient documentation

## 2016-11-06 DIAGNOSIS — Z853 Personal history of malignant neoplasm of breast: Secondary | ICD-10-CM | POA: Insufficient documentation

## 2016-11-06 DIAGNOSIS — Z9221 Personal history of antineoplastic chemotherapy: Secondary | ICD-10-CM | POA: Insufficient documentation

## 2016-11-07 ENCOUNTER — Ambulatory Visit (HOSPITAL_COMMUNITY): Payer: PPO

## 2016-11-09 ENCOUNTER — Ambulatory Visit (HOSPITAL_COMMUNITY): Payer: PPO

## 2016-11-14 ENCOUNTER — Other Ambulatory Visit (HOSPITAL_COMMUNITY): Payer: Self-pay | Admitting: Hematology & Oncology

## 2016-11-14 ENCOUNTER — Ambulatory Visit (HOSPITAL_COMMUNITY)
Admission: RE | Admit: 2016-11-14 | Discharge: 2016-11-14 | Disposition: A | Discharge status: Home / Self Care | Payer: PPO | Source: Ambulatory Visit | Attending: Hematology & Oncology | Admitting: Hematology & Oncology

## 2016-11-14 DIAGNOSIS — C342 Malignant neoplasm of middle lobe, bronchus or lung: Secondary | ICD-10-CM | POA: Diagnosis not present

## 2016-11-14 DIAGNOSIS — R938 Abnormal findings on diagnostic imaging of other specified body structures: Secondary | ICD-10-CM | POA: Insufficient documentation

## 2016-11-14 DIAGNOSIS — C3490 Malignant neoplasm of unspecified part of unspecified bronchus or lung: Secondary | ICD-10-CM | POA: Diagnosis not present

## 2016-11-15 ENCOUNTER — Encounter (HOSPITAL_COMMUNITY): Payer: Self-pay | Admitting: Adult Health

## 2016-11-15 ENCOUNTER — Encounter (HOSPITAL_BASED_OUTPATIENT_CLINIC_OR_DEPARTMENT_OTHER): Payer: PPO | Admitting: Adult Health

## 2016-11-15 VITALS — BP 147/72 | HR 87 | Temp 98.0°F | Resp 16 | Wt 163.0 lb

## 2016-11-15 DIAGNOSIS — C342 Malignant neoplasm of middle lobe, bronchus or lung: Secondary | ICD-10-CM

## 2016-11-15 DIAGNOSIS — F0390 Unspecified dementia without behavioral disturbance: Secondary | ICD-10-CM | POA: Diagnosis not present

## 2016-11-15 DIAGNOSIS — Z853 Personal history of malignant neoplasm of breast: Secondary | ICD-10-CM | POA: Diagnosis not present

## 2016-11-15 DIAGNOSIS — C50911 Malignant neoplasm of unspecified site of right female breast: Secondary | ICD-10-CM

## 2016-11-15 DIAGNOSIS — Z7189 Other specified counseling: Secondary | ICD-10-CM

## 2016-11-15 NOTE — Progress Notes (Signed)
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 Although growth of the left basilar nodule has been slow, neoplasm cannot be excluded.      12/31/2014 PET scan    The 7 mm right middle lobe nodule is hypermetabolic, and there is considerable focal hypermetabolic activity along the posterior inferior margin of the right mainstem bronchus, presumably in the nonenlarged subcarinal lymph node in this vicinity...      03/19/2015 Imaging    MRI brain- No evidence of metastatic disease to the brain or meninges.      06/30/2015 Imaging    CT chest- Significant interval enlargement of right middle lobe 15 mm nodule (previously 7 mm) and 17 mm subcarinal lymph node. Stable lingular and anterior left lower lobe nodules.      07/15/2015 Procedure    CT guided biopsy of right middle lobe nodule, by IR      07/15/2015 Pathology Results    Lung, needle/core biopsy(ies), right middle lobe - POSITIVE FOR POORLY DIFFERENTIATED CARCINOMA      07/27/2015 Procedure    Fiberoptic video bronchoscopy, endobronchial ultrasound- guided transbronchial biopsy of level 7 mediastinal lymph node - subcarinal lymph node by Dr. Tharon Aquas Tright.      07/27/2015 Pathology Results    WANG NEEDLE, FINE NEEDLE ASPIRATION, 7 NODE #2, B (SPECIMEN 2 OF 2, COLLECTED ON 07/27/15): NO MALIGNANT CELLS IDENTIFIED. FINE NEEDLE ASPIRATION, ENDOSCOPIC SPECIMEN A, EBUS 7 NODE (SPECIMEN 1 OF 2,  COLLECTED ON 07/27/2015):NO MALIGNANT CELLS IDENTIFIED.      09/08/2015 - 09/15/2015 Radiation Therapy    Right middle lobe/ 54 Gy at 18 GY per fraction x 3 fractions by Dr. Pablo Ledger      10/27/2015 Imaging    CT chest- response to therapy of the right middle lobe lung lesion. Only presumed radiation induced scarring remains. Progression of mediastinal adenopathy with probable developing right infrahilar nodal metastasis. Smaller pulmonary nodules are stable      11/08/2015 PET scan    Prominent and hypermetabolic subcarinal and right infrahilar adenopathy compatible with malignancy, both the size and metabolic activity of these lymph nodes is significantly increased from April 2016.      11/08/2015 Relapse/Recurrence    PET shows progressive disease      11/22/2015 - 12/27/2015 Chemotherapy    Carboplatin/Paclitaxel weekly      11/23/2015 Imaging    MRI brain- No acute or metastatic intracranial abnormality.  Stable MRI appearance of the brain since 2016.      11/24/2015 - 12/31/2015 Radiation Therapy    Dr. Pablo Ledger, 60 gy in 30 fractions at 2 Gy per fraction      02/11/2016 Imaging    Continued evolution of postradiation changes in RLL w/o findings to suggest local recurrence of disease or definite metastatic disease in thorax, resolution of previously noted mediastinal LAD      05/09/2016 Imaging    CT C/A/P with  no new adenopathy or new mass to suggest recurrence. Increased volume loss and density in the RML and RLL related to prior radiation and associated atelectasis, this obscures the site of the prior RML nodule. Stable 6 mm lingular nodule, no change from earliest available comparison 12/31/2014      07/12/2016 Imaging    Mildly abnormal MRI brain (with and without) demonstrating: - Mild diffuse and moderate perisylvian / mesial temporal atrophy.  - Mild scattered periventricular and subcortical foci of non-specific gliosis. - No acute findings. No abnormal enhancing  lesions. - No significant change from MRI on 04/03/15 and 11/23/15.      BREAST CANCER HISTORY:  (From Dr. Donald Pore last note 08/16/16)     HISTORY OF PRESENT ILLNESS:  (From Dr. Donald Pore last note 08/16/16)     INTERVAL HISTORY:  Kari Bullock 76 y.o. female presents for routine follow-up for her lung and breast cancers.   She was recently starting on Aricept for her dementia; been following up with Dr. [name], neurologist, periodically. Family shares that they are scheduled to see Dr. [name] tomorrow.   The patient herself does not speak much during our visit. Her family, her husband and daughter, share that her dementia has progressively worsened in recent months.  The patient and her husband have been married for aoubt 58 years.  He tells me that she used to be meticulous about her hygiene and is now refusing to bathe very often. She is incontinent of urine quite often.  Husband reports episodes of combativeness, particularly as it relates to maintaining her hygiene. This is very uncharacteristic of her normal behavior.  Her family is very concerned about her progressive dementia, with worsening symptoms noted in the past month.   The patient herself tells me she has been feeling okay.  Denies any specific complaints.  Husband reports she has recently complained about back pain in the past 2 weeks, particularly with changing positions while seated and watching TV.   Ms. Soucy recently had CT chest and is here to review those results.      REVIEW OF SYSTEMS:  Review of Systems  Unable to perform ROS: Dementia     PAST MEDICAL/SURGICAL HISTORY:  Past Medical History:  Diagnosis Date  . Anemia    in the past  . Breast cancer ([place])    2001 RT breat lumpectomy rad tx  . Complication of anesthesia    confusion  . Diarrhea   . GERD (gastroesophageal reflux disease)   . Headache    years ago, none since hysterectomy  . Hypertension   . 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 Lung cancer ([place]) 2017  . Memory change 10/24/2013  . Memory loss   . Pulmonary nodule 01/09/2015  . Pure hypercholesterolemia    Past Surgical History:  Procedure Laterality Date  . ABDOMINAL HYSTERECTOMY    . BILATERAL OOPHORECTOMY  2007  . BREAST SURGERY Right    right lumpectomy, lymph glands remove  . CARPAL TUNNEL RELEASE Right   . CATARACT EXTRACTION Bilateral    lens implant  . COLONOSCOPY  2011   RMR: 1. Anal papilla, otherwise normal rectum. 2. Left-sided diverticula, single diminutive polyp in the sigmoid segment status post cold biospy removal. Remainder of the colonic mucosa appeared normal.   . COLONOSCOPY N/A 01/14/2015   Procedure: COLONOSCOPY;  Surgeon: Daneil Dolin, MD;  [facility];  Service: Endoscopy;  Laterality: N/A;  930 - moved to 10:15  .  ESOPHAGOGASTRODUODENOSCOPY N/A 01/14/2015   RMR: Colonic diverticulosis. status post segmental biospy and stool sampling.  No egd done today  . ESOPHAGOGASTRODUODENOSCOPY N/A 02/11/2015   RMR: Focally abnormal antrum of doubtful clinical significance- Status post biopsy.   . INCISION AND DRAINAGE OF WOUND  2011   buttocks  . TONSILLECTOMY    . VIDEO BRONCHOSCOPY WITH ENDOBRONCHIAL ULTRASOUND N/A 01/19/2015   Procedure: VIDEO BRONCHOSCOPY WITH ENDOBRONCHIAL ULTRASOUND;  Surgeon: Ivin Poot, MD;  Location: [facility];  Service: Thoracic;  Laterality: N/A;  . VIDEO BRONCHOSCOPY WITH ENDOBRONCHIAL ULTRASOUND N/A 07/27/2015   Procedure: VIDEO BRONCHOSCOPY WITH ENDOBRONCHIAL ULTRASOUND;  Surgeon: Ivin Poot, MD;  Location: [facility];  Service: Thoracic;  Laterality: N/A;     SOCIAL HISTORY:  Social History   Social History  . Marital status: Married    Spouse name: N/A  . Number of children: 3  . Years of education: 12   Occupational History  . Retired Retired   Social History Main Topics  . Smoking status: Former Smoker    Packs/day: 1.00    Years: 20.00    Types: Cigarettes     Quit date: 11/20/1999  . Smokeless tobacco: Never Used  . Alcohol use No     Comment: occasional wine  . Drug use: No  . Sexual activity: Not on file   Other Topics Concern  . Not on file   Social History Narrative  . No narrative on file    FAMILY HISTORY:  Family History  Problem Relation Age of Onset  . Stroke Mother   . Breast cancer Maternal Aunt   . Breast cancer Maternal Grandmother   . Heart attack Father   . Pancreatic cancer Brother     CURRENT MEDICATIONS:  Outpatient Encounter Prescriptions as of 11/15/2016  Medication Sig  . ALPRAZolam (XANAX) 0.5 MG tablet Take 1 tablet (0.5 mg total) by mouth at bedtime as needed for anxiety.  . ARIPiprazole (ABILIFY) 5 MG tablet TAKE 1 TABLET(5 MG) BY MOUTH DAILY  . aspirin EC 81 MG tablet Take 81 mg by mouth daily.  . cholecalciferol (VITAMIN D) 1000 UNITS tablet Take 1,000 Units by mouth at bedtime. D3  . donepezil (ARICEPT) 10 MG tablet Take 1 tablet (10 mg total) by mouth at bedtime.  Marland Kitchen erythromycin (E-MYCIN) 250 MG tablet Take 1 tablet (250 mg total) by mouth 4 (four) times daily.  Marland Kitchen lidocaine-prilocaine (EMLA) cream Apply a quarter size amount to port site 1 hour prior to chemo. Do not rub in. Cover with plastic wrap.  . losartan-hydrochlorothiazide (HYZAAR) 50-12.5 MG per tablet Take 1 tablet by mouth daily.   . naproxen sodium (ANAPROX) 220 MG tablet Take 220 mg by mouth 2 (two) times daily as needed (pain). ALEVE  . omeprazole (PRILOSEC) 20 MG capsule TAKE 1 CAPSULE(20 MG) BY MOUTH TWICE DAILY BEFORE A MEAL   No facility-administered encounter medications on file as of 11/15/2016.     ALLERGIES:  Allergies  Allergen Reactions  . Codeine Nausea And Vomiting  . Morphine And Related Itching  . Ciprofloxacin Other (See Comments)    Agitation/anxiety  . Adhesive [Tape] Rash    Please use paper tape  . Sulfur Rash    Vomitting  . Tamoxifen Other (See Comments)    Blurred vision and dizziness     PHYSICAL EXAM:   Karnofsky performance status: 50-60 due to dementia     Vitals:   11/15/16 1155  BP: (!) 147/72  Pulse: 87  Resp:  16  Temp: 98 F (36.7 C)   Filed Weights   11/15/16 1155  Weight: 163 lb (73.9 kg)    Physical Exam  Constitutional: She is well-developed, well-nourished, and in no distress.  HENT:  Head: Normocephalic.  Mouth/Throat: Oropharynx is clear and moist. No oropharyngeal exudate.  Eyes: Conjunctivae are normal. Pupils are equal, round, and reactive to light. No scleral icterus.  Neck: Normal range of motion. Neck supple.  Cardiovascular: Normal rate, regular rhythm and normal heart sounds.   Pulmonary/Chest: Effort normal and breath sounds normal. No respiratory distress.  Abdominal: Soft. Bowel sounds are normal. There is no tenderness.  Musculoskeletal: Normal range of motion. She exhibits no edema.  Lymphadenopathy:    She has no cervical adenopathy.       Right: No supraclavicular adenopathy present.       Left: No supraclavicular adenopathy present.  Neurological: She is alert. No cranial nerve deficit. Gait normal.  Followed commands for physical exam   Skin: Skin is warm and dry. No rash noted.  Psychiatric: Mood normal. She has a flat affect.  Nursing note and vitals reviewed.    LABORATORY DATA:  I have reviewed the labs as listed.  CBC    Component Value Date/Time   WBC 5.7 10/12/2016 1116   RBC 4.65 10/12/2016 1116   HGB 13.3 10/12/2016 1116   HCT 39.9 10/12/2016 1116   PLT 164 10/12/2016 1116   MCV 85.8 10/12/2016 1116   MCH 28.6 10/12/2016 1116   MCHC 33.3 10/12/2016 1116   RDW 13.5 10/12/2016 1116   LYMPHSABS 0.9 10/12/2016 1116   MONOABS 0.5 10/12/2016 1116   EOSABS 0.2 10/12/2016 1116   BASOSABS 0.0 10/12/2016 1116   CMP Latest Ref Rng & Units 10/12/2016 08/16/2016 06/12/2016  Glucose 65 - 99 mg/dL 97 93 128(H)  BUN 6 - 20 mg/dL 11 12 13   Creatinine 0.44 - 1.00 mg/dL 0.55 0.56 0.74  Sodium 135 - 145 mmol/L 133(L) 136 137   Potassium 3.5 - 5.1 mmol/L 3.6 3.4(L) 3.3(L)  Chloride 101 - 111 mmol/L 98(L) 101 103  CO2 22 - 32 mmol/L 27 28 27   Calcium 8.9 - 10.3 mg/dL 9.9 10.1 10.2  Total Protein 6.5 - 8.1 g/dL 6.6 6.8 6.8  Total Bilirubin 0.3 - 1.2 mg/dL 0.6 0.8 0.5  Alkaline Phos 38 - 126 U/L 71 64 61  AST 15 - 41 U/L 20 17 24   ALT 14 - 54 U/L 18 16 18     PENDING LABS:    DIAGNOSTIC IMAGING:  CT chest: 11/14/16   Last mammogram: 02/18/16   Left breast diagnostic mammogram: 03/07/16    MRI brain: 07/12/16    PATHOLOGY:  RML lung biopsy: 07/15/15     ASSESSMENT & PLAN:   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 Pablo Ledger in 08/2015.  Subsequent PET scan showed progressive disease in 10/2015; completed current chemoradiation with Carbo/Taxol on 12/31/15.   -CT chest with new compression deformity at T5 with sclerosis of vertebral body. Findings concerning for pathologic fracture, likely secondary to malignancy.    Stage II (T1aN1a) invasive ductal carcinoma, ER+/PR+/HER2 indeterminate: -Diagnosed in 10/1999; treated with surgery, followed by radiation, then anti-estrogen therapy.  Started initially on Tamoxifen, but could not tolerate, so was switched to Arimidex; completed total of 5 years of anti-estrogen therapy in 01/2005.    Progressive dementia/mental status changes:  -Not likely related to malignancy; last MRI brain  in 06/2016 was negative for mass.  -Continue follow-up with Dr. [name] with neurology, as directed.   Goals of care discussion:  -The majority of our time today was spent discussing goals of care. The patient was unable to participate much in the conversation given her dementia.  Her husband and 1 of her 3 daughters were present today for this discussion.  We discussed that the findings of the recent CT chest are concerning for metastatic cancer; unclear if the pathologic fracture is secondary to her  history of breast or lung cancer.  Discussed the option of pursuing PET scan to more definitively evaluate the T5 lesion. They understand that if PET scan confirms metastatic disease, which we have high suspicion, then her cancer would not be curable; however, it would be treatable.  They are not interesting in pursuing PET scan at this time.  Her family is very overwhelmed with her mental status changes and the apparent progression of her dementia.  "We're not even really very worried about the cancer at this point because of how bad the dementia has gotten lately."   I provided support with active listening and expressive supportive counseling. The family is concerned about how best to manage her physical needs going forward.  We broached the option of initiating hospice vs home health.  Each service could provide some additional support at home for the patient with regards to her ADLs/personal hygiene.  They understand that hospice and home health nurses or aides do not come out daily to the home, but do visit a few times per week generally.  We discussed that general guidelines dictate a patient's eligibility for hospice to indicate a life expectancy of 6 months or less.  Given her progressive dementia and likely metastatic cancer, she should qualify for home hospice.  The advantage of hospice would be helping control Ms. Paris symptoms as they arise and providing additional support for the family.  The patient's husband has heard great things about hospice and is strongly considering this option.  He would like to discuss with his other 2 daughters before making a decision, which is completely reasonable.  We discussed that should her back pain get worse, then we could revisit the option of palliative radiation to the T5 lesion for pain control vs pain control with medications.  The family has very reasonable expectations and understand that Ms. Konecny's prognosis given her dementia and cancer is poor.  They  want to optimize her quality of life, which is extremely reasonable.  They will discuss hospice vs home health with the rest of the family and will let us know what they decide. We are happy to initiate these referrals once they have decided.  Encouraged them to contact us when needed.  I will plan to follow-up with them at the next port-a-cath flush appt regarding their family discussions.     Dispo:  -Return to cancer center for follow-up in coordination with next port-a-cath flush.    All questions were answered to patient's stated satisfaction. Encouraged patient to call with any new concerns or questions before her next visit to the cancer center and we can certain see her sooner, if needed.    Plan of care discussed with Dr. Talbert Cage, who agrees with the above aforementioned.     Orders placed this encounter:  No orders of the defined types were placed in this encounter.     Mike Craze, NP [facility] [phone]

## 2016-11-15 NOTE — Patient Instructions (Addendum)
[facility] Discharge Instructions  RECOMMENDATIONS MADE BY THE CONSULTANT AND ANY TEST RESULTS WILL BE SENT TO YOUR REFERRING PHYSICIAN.  You were seen today by Kari Craze NP. Call us if anything changes. Return on 12/07/16 for port flush and follow up with Kari Bullock.  Thank you for choosing [facility] to provide your oncology and hematology care.  To afford each patient quality time with our provider, please arrive at least 15 minutes before your scheduled appointment time.    If you have a lab appointment with the [facility] please come in thru the  Main Entrance and check in at the main information desk  You need to re-schedule your appointment should you arrive 10 or more minutes late.  We strive to give you quality time with our providers, and arriving late affects you and other patients whose appointments are after yours.  Also, if you no show three or more times for appointments you may be dismissed from the clinic at the providers discretion.     Again, thank you for choosing [facility].  Our hope is that these requests will decrease the amount of time that you wait before being seen by our physicians.       _____________________________________________________________  Should you have questions after your visit to [facility], please contact our office at [phone] between the hours of 8:30 a.m. and 4:30 p.m.  Voicemails left after 4:30 p.m. will not be returned until the following business day.  For prescription refill requests, have your pharmacy contact our office.       Resources For Cancer Patients and their Caregivers ? American Cancer Society: Can assist with transportation, wigs, general needs, runs Look Good Feel Better.        762-655-9721 ? Cancer Care: Provides financial assistance, online support groups, medication/co-pay assistance.  1-800-813-HOPE  (917)061-1433) ? [facility] Assists [place] cancer patients and their families through emotional , educational and financial support.  941-426-8195 ? [place] DSS Where to apply for food stamps, Medicaid and utility assistance. 908 607 1488 ? [facility]: Transportation to medical appointments. 226-138-5987 ? Social Security Administration: May apply for disability if have a Stage IV cancer. 854-199-7638 367-221-6784 ? [employer], Disability and Transit Services: Assists with nutrition, care and transit needs. [place] Support Programs: '@10RELATIVEDAYS'$ @ > Cancer Support Group  2nd Tuesday of the month 1pm-2pm, Journey Room  > Creative Journey  3rd Tuesday of the month 1130am-1pm, Journey Room  > Look Good Feel Better  1st Wednesday of the month 10am-12 noon, Journey Room (Call [facility] to register [phone])

## 2016-11-16 ENCOUNTER — Encounter: Payer: Self-pay | Admitting: Neurology

## 2016-11-16 ENCOUNTER — Ambulatory Visit (INDEPENDENT_AMBULATORY_CARE_PROVIDER_SITE_OTHER): Payer: PPO | Admitting: Neurology

## 2016-11-16 VITALS — BP 125/78 | HR 92 | Wt 163.0 lb

## 2016-11-16 DIAGNOSIS — R300 Dysuria: Secondary | ICD-10-CM | POA: Diagnosis not present

## 2016-11-16 DIAGNOSIS — R413 Other amnesia: Secondary | ICD-10-CM | POA: Diagnosis not present

## 2016-11-16 MED ORDER — DONEPEZIL HCL 5 MG PO TABS: 5.0000 mg | ORAL_TABLET | Freq: Every day | ORAL | 5 refills | Status: AC

## 2016-11-16 MED ORDER — ESCITALOPRAM OXALATE 5 MG PO TABS: 5.0000 mg | ORAL_TABLET | Freq: Every day | ORAL | 3 refills | Status: AC
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
## 2016-11-17 ENCOUNTER — Telehealth: Payer: Self-pay | Admitting: Neurology

## 2016-11-17 LAB — MICROSCOPIC EXAMINATION
CASTS: NONE SEEN /LPF
WBC, UA: >30 /hpf — AB (ref 0–?)

## 2016-11-17 LAB — URINALYSIS, ROUTINE W REFLEX MICROSCOPIC
BILIRUBIN UA: NEGATIVE
Glucose, UA: NEGATIVE
Ketones, UA: NEGATIVE
Nitrite, UA: NEGATIVE
PH UA: 7 (ref 5.0–7.5)
PROTEIN UA: NEGATIVE
RBC, UA: NEGATIVE
Specific Gravity, UA: 1.013 (ref 1.005–1.030)
Urobilinogen, Ur: 0.2 mg/dL (ref 0.2–1.0)

## 2016-11-17 MED ORDER — ERYTHROMYCIN BASE 250 MG PO TABS: 250.0000 mg | ORAL_TABLET | Freq: Four times a day (QID) | ORAL | 0 refills | Status: AC

## 2016-11-17 NOTE — Telephone Encounter (Signed)
I called the family, the patient does have white blood cells in the urine, I will treat with erythromycin, a prescription will be sent in.

## 2016-11-17 NOTE — Telephone Encounter (Signed)
Dr [name]- please advise

## 2016-11-17 NOTE — Telephone Encounter (Signed)
[facility] [phone] called said he does not have erythromycin (E-MYCIN)  '250mg'$ , he does have '333mg'$ , he advised Kari Bullock could take 3 x day. He said can call with the change or escrib. Thank you

## 2016-11-17 NOTE — Telephone Encounter (Signed)
I called the pharmacy, [place] to switch to the 333 mg tablets.

## 2016-11-23 ENCOUNTER — Encounter: Payer: Self-pay | Admitting: *Deleted

## 2016-11-23 NOTE — Progress Notes (Signed)
Faxed signed orders back to [facility] with physicians certification of terminal illness. Fax:[phone]. Received confirmation.

## 2016-11-27 ENCOUNTER — Encounter: Payer: Self-pay | Admitting: *Deleted

## 2016-11-27 NOTE — Progress Notes (Signed)
Faxed signed hospice POC by CW,MD back to [facility] Fax: [phone]. Received confirmation

## 2016-12-07 ENCOUNTER — Other Ambulatory Visit (HOSPITAL_COMMUNITY): Payer: PPO

## 2016-12-07 ENCOUNTER — Ambulatory Visit (HOSPITAL_COMMUNITY): Payer: PPO | Admitting: Adult Health

## 2016-12-19 ENCOUNTER — Telehealth: Payer: Self-pay | Admitting: *Deleted

## 2016-12-19 NOTE — Telephone Encounter (Signed)
Placed signed DNR form in mail to [facility].

## 2016-12-19 NOTE — Telephone Encounter (Signed)
Received DNR order in the mail to be signed by CW,MD. No DOB. Called [facility] and clarified patient and DOB [birth year] with Becky. Awaiting CW,MD signature.

## 2017-02-01 ENCOUNTER — Other Ambulatory Visit: Payer: Self-pay

## 2017-02-01 MED ORDER — OMEPRAZOLE 20 MG PO CPDR: DELAYED_RELEASE_CAPSULE | ORAL | 3 refills | Status: AC

## 2017-02-16 DEATH — deceased

## 2017-03-24 ENCOUNTER — Other Ambulatory Visit: Payer: Self-pay | Admitting: Nurse Practitioner

## 2017-03-28 ENCOUNTER — Ambulatory Visit: Payer: PPO | Admitting: Adult Health

## 2017-09-25 IMAGING — CT CT CHEST W/O CM
2 of 3 series · 15 of 36 positions shown, 18 images · non-contrast
Comparison: 06/30/2015

CLINICAL DATA: Right middle lobe lung carcinoma diagnosed in 8586.
Radiation therapy 09/15/2015. Right breast cancer 16 years ago with
lumpectomy and radiation therapy.

EXAM:
CT CHEST WITHOUT CONTRAST
TECHNIQUE: Multidetector CT imaging of the chest was performed following the
standard protocol without IV contrast.

[Series 2: chestroutine 5.0 b40f · axial · 0.62mm/px · z∈[-275,+10]mm · 12 of 67 slices shown, 15 images]
[im 5/67  mediastinal]
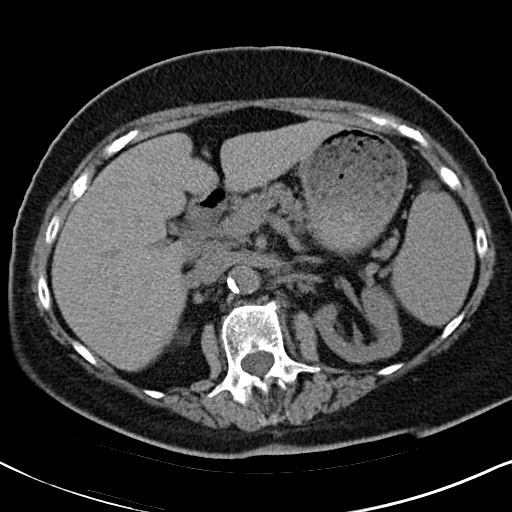
[im 5/67  lung]
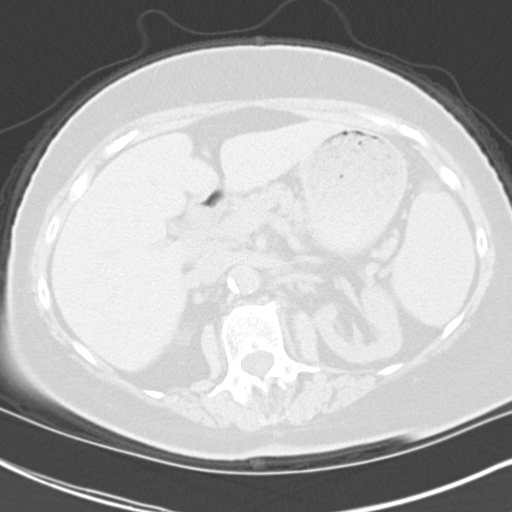
[im 10/67  lung]
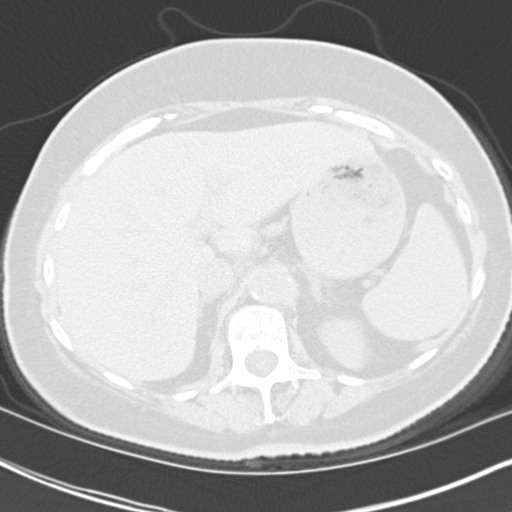
[im 15/67  lung]
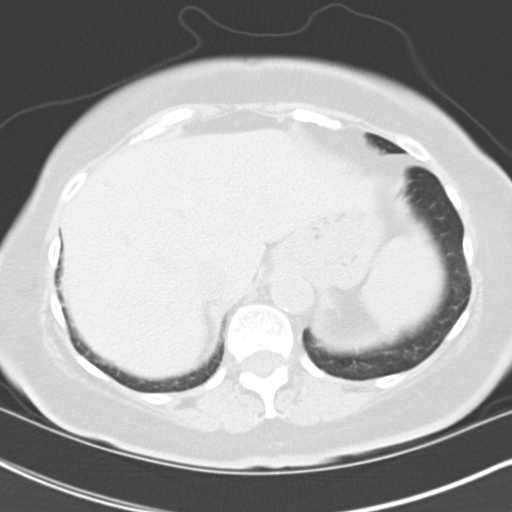
[im 20/67  lung]
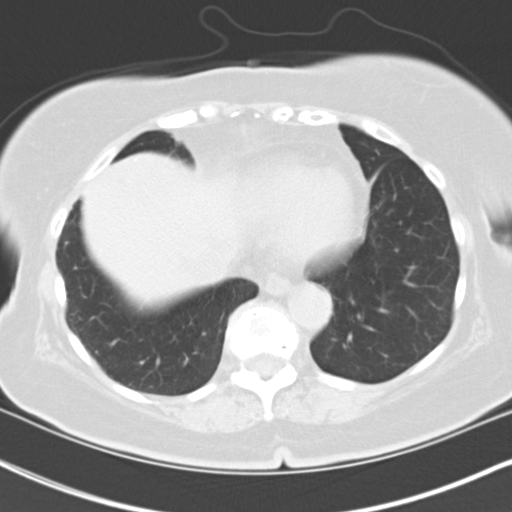
[im 25/67  mediastinal]
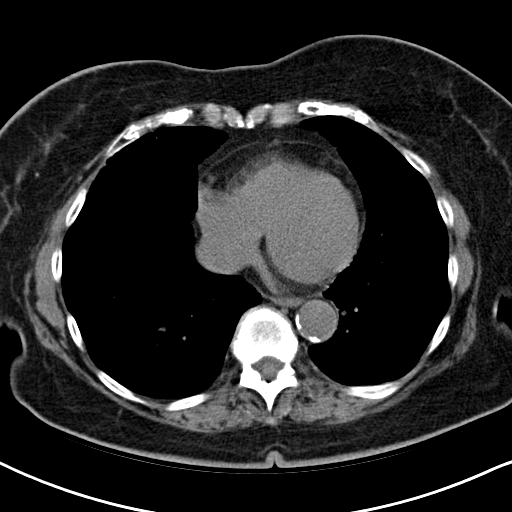
[im 25/67  lung]
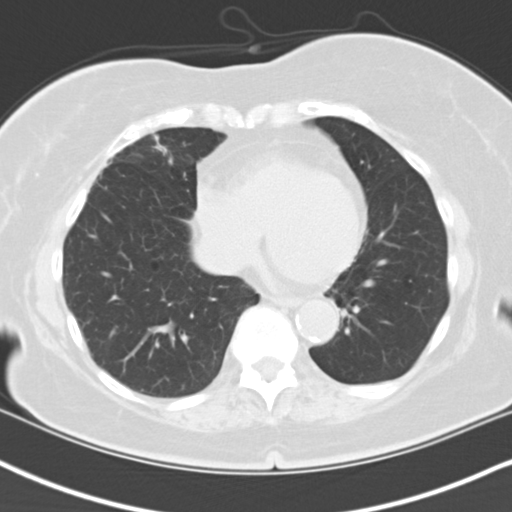
[im 30/67  lung]
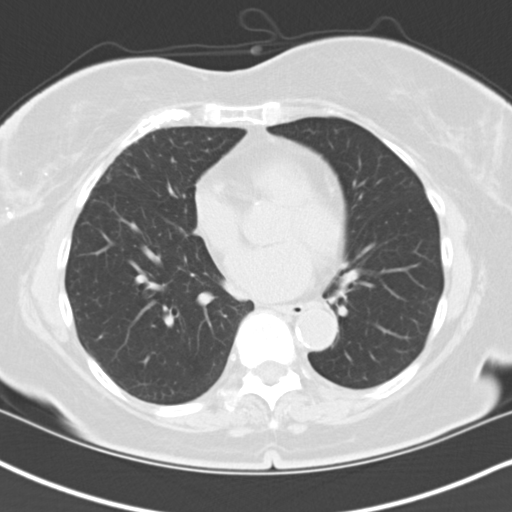
[im 37/67  lung]
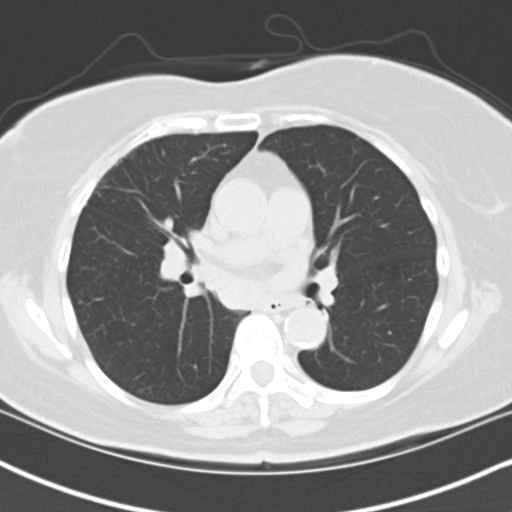
[im 42/67  lung]
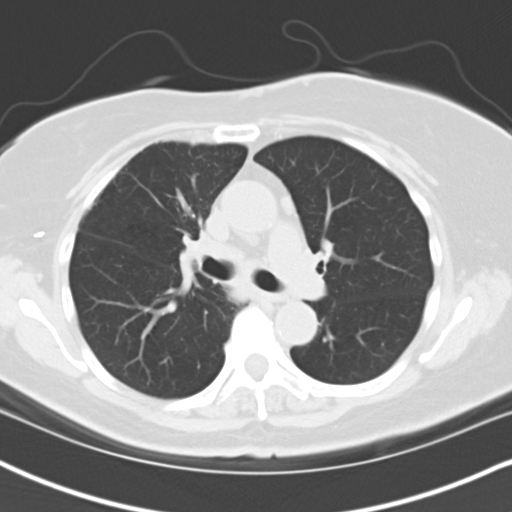
[im 47/67  mediastinal]
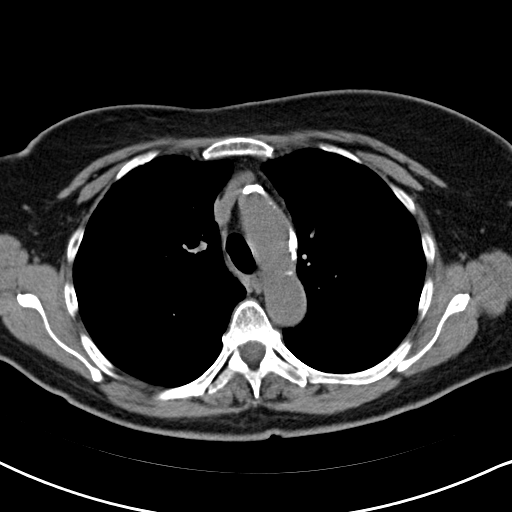
[im 47/67  lung]
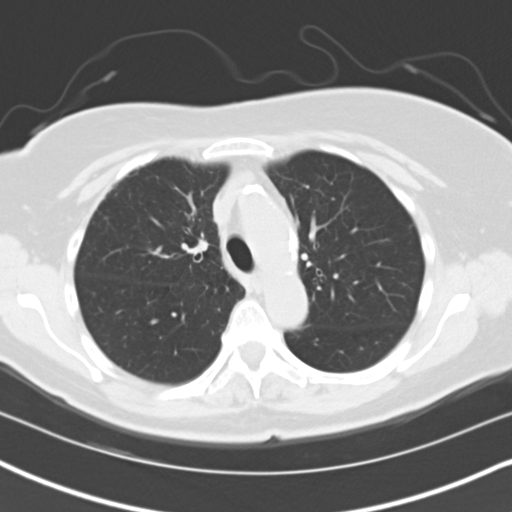
[im 52/67  lung]
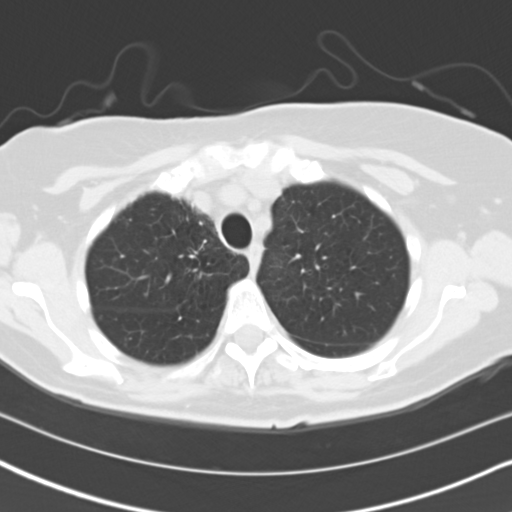
[im 57/67  lung]
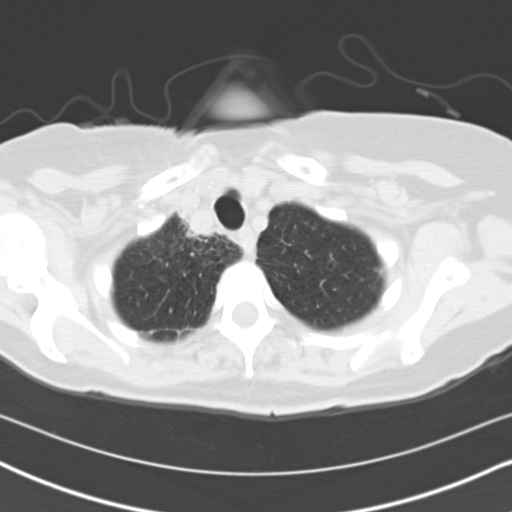
[im 62/67  lung]
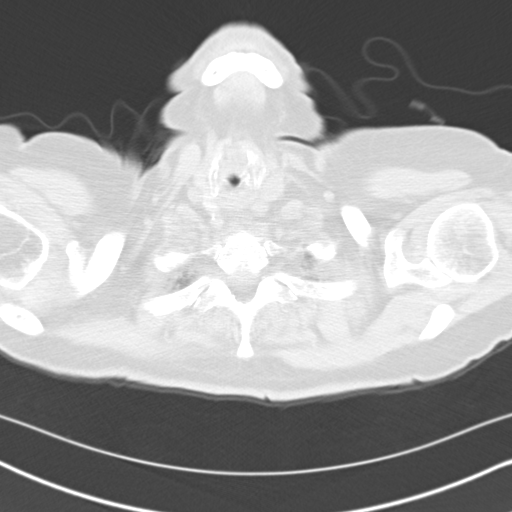

[Series 4: mpr coro 3mm · coronal · 0.66mm/px · 3 of 78 slices shown]
[im 16/78  lung]
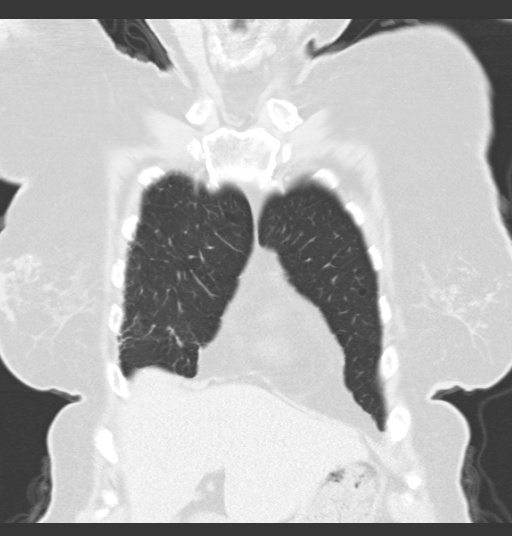
[im 31/78  lung]
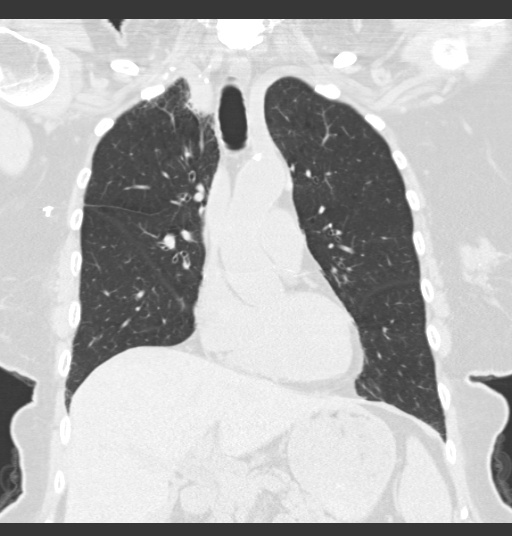
[im 47/78  lung]
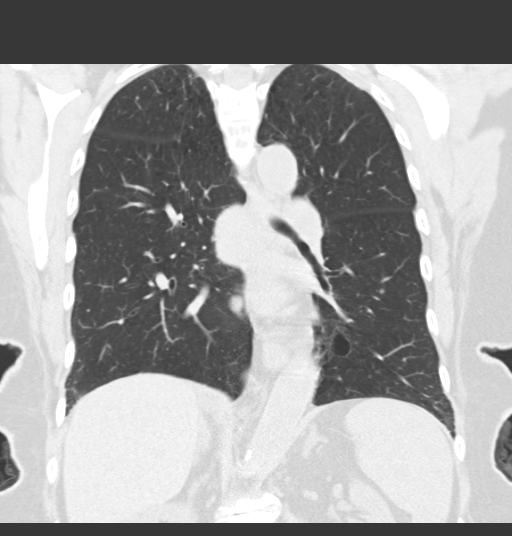

[15 of 36 positions shown; findings below may reference images not displayed]

FINDINGS: Mediastinum/Nodes: No supraclavicular adenopathy. Right axillary
node dissection. No axillary adenopathy. Aortic and branch vessel
atherosclerosis. Normal heart size, without pericardial effusion.
LAD coronary artery atherosclerosis.

Subcarinal node measures 2.6 x 2.8 cm on image 28/series 2. Compare
1.7 x 2.3 cm on the prior.

Suspect developing right infrahilar adenopathy at 1.5 cm on image
32/series 2. Compare 1.2 cm on the prior.

Lungs/Pleura: No pleural fluid.  Mild centrilobular emphysema.

Lingular nodule which measures 7 mm on image 36/series 3. Compare
similar to the prior exam (when remeasured).

A 3 mm left lower lobe pulmonary nodule on image 37/series 3 is
unchanged.

The right middle lobe dominant pulmonary nodule is no longer
identified. Presumed radiation change in this area, including on
image 42/series 3.

Minimal posterior right upper lobe nodularity 2 mm on image
16/series 3, similar.

Pleural-based anterior left lower lobe pulmonary nodule is similar
at 12 mm on image 55/ series 3.

Upper abdomen: Normal imaged portions of the liver, spleen, stomach,
pancreas, adrenal glands, kidneys.

Musculoskeletal: Thoracolumbar spondylosis.
IMPRESSION: 1. response to therapy of the right middle lobe lung lesion. Only
presumed radiation induced scarring remains.
2. Progression of mediastinal adenopathy with probable developing
right infrahilar nodal metastasis.
3. Smaller pulmonary nodules are unchanged and indeterminate.
4.  Atherosclerosis, including within the coronary arteries.
